# Patient Record
Sex: Male | Born: 1940
Health system: Southern US, Community
[De-identification: ages and names within clinical notes are randomized; demographics above are authoritative.]

## PROBLEM LIST (undated history)

## (undated) DIAGNOSIS — I2510 Atherosclerotic heart disease of native coronary artery without angina pectoris: Secondary | ICD-10-CM

## (undated) DIAGNOSIS — I739 Peripheral vascular disease, unspecified: Secondary | ICD-10-CM

## (undated) DIAGNOSIS — I255 Ischemic cardiomyopathy: Secondary | ICD-10-CM

## (undated) DIAGNOSIS — I509 Heart failure, unspecified: Secondary | ICD-10-CM

## (undated) DIAGNOSIS — E785 Hyperlipidemia, unspecified: Secondary | ICD-10-CM

## (undated) DIAGNOSIS — I6529 Occlusion and stenosis of unspecified carotid artery: Secondary | ICD-10-CM

## (undated) DIAGNOSIS — I10 Essential (primary) hypertension: Secondary | ICD-10-CM

## (undated) HISTORY — DX: Atherosclerotic heart disease of native coronary artery without angina pectoris: I25.10

## (undated) HISTORY — PX: LIPOMA EXCISION: SHX5283

## (undated) HISTORY — DX: Essential (primary) hypertension: I10

## (undated) HISTORY — DX: Hyperlipidemia, unspecified: E78.5

## (undated) HISTORY — DX: Heart failure, unspecified: I50.9

## (undated) HISTORY — DX: Ischemic cardiomyopathy: I25.5

## (undated) HISTORY — PX: CORONARY ANGIOPLASTY: SHX604

## (undated) HISTORY — DX: Peripheral vascular disease, unspecified: I73.9

## (undated) HISTORY — DX: Occlusion and stenosis of unspecified carotid artery: I65.29

---
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
## 2014-03-12 DIAGNOSIS — I2510 Atherosclerotic heart disease of native coronary artery without angina pectoris: Secondary | ICD-10-CM

## 2015-02-19 NOTE — H&P (Signed)
OFFICE VISIT NOTES COPIED TO EPIC FOR DOCUMENTATION  Shawn Schmidt 03/03/15 8:44 AM Location: [facility] Patient #: [phone] DOB: 11-18-1940 Married / Language: English / Race: Black or African American Male   History of Present Illness Shawn Page MD; 03-Mar-2015 6:01 PM) The patient is a 74 year old male who presents for a follow-up for CAD. He had initially presented to establish care for continued cardiac follow-up and management. He currently has a home here and at [facility]. He had ST elevation MI at [facility] on November 28th, 2015 with symptoms of fatigue and heaviness in his chest. He called EMS and was told he was having a heart attack and was taken straight to the cath lab. He underwent PCI to the mid and proximal LAD by placement of 2 overlapping mid LAD stents.   Onlast OV, he was noted to be hypertensive and losartan was changed to valsartan/HCT. At his last vist 1 month ago, echo was ordered to reevaluate heart function. This revealed persistently decreased EF at 40% and global hypokinesis. He was then scheduled for nuclear stress test and presents today for follow up and reevaluation.  Due to cost, his PCP changed his Effient to Plavix. He remains on all other medications and is tolerating well without side effects or bleeding diasthesis.  He denies any recurrence of chest pain, he denies SOB, PND, orthopnea, dizziness, syncope, or symptoms suggestive of TIA. He had initially denied claudication, however, during exercise portion of stress test, he reported significant weakness and discomfort in bilateral calves and now admits to cramping but does not do much or walk uphill. He denies a history of hyperlipidemia, HTN, or diabetes. He was previously smoking 2 ppd of cigarettes and has now been abstinant from tobacco for 2 months.   Problem List/Past Medical Shawn Schmidt; 03-03-2015 10:32 AM) Atherosclerosis of native coronary artery of  native heart without angina pectoris (I25.10) Shawn Schmidt Acute anterior MI: Stenting with 3.5 x 12 mm proximal and 3.5 x 32 mm promos Premier DES in the mid LAD. 50-60% mid circumflex stenosis, nondominant right with a mid 85% stenosis. Essential hypertension, benign (I10) Labs 06/27/2014: Creatinine 1.20, eGFR 70, BMP normal Labs 05/09/2014: Serum glucose 104, creatinine 1.46, eGFR 55, potassium 4.6, CMP otherwise normal Labs 03/13/2014: CBC normal, potassium 3.4, creatinine 1.20, eGFR 70, CMP otherwise normal, Tobacco use disorder, continuous (F17.209) Hyperlipidemia, group A (E78.00) Labs 05/09/2014: Total cholesterol 130, triglycerides 74, HDL 50, LDL 65, LDL particle # [phone], LP-IR score 31 Labs 03/13/2014: total cholesterol 177, triglycerides 100, HDL 61, LDL 113 History of MI (myocardial infarction) (I25.2)03/12/2014 03/12/2014: At [facility], [place] S/P angioplasty with stent (Z95.9)03/12/2014 [facility] Acute anterior MI 03/12/2014: Stenting with 3.5 x 12 mm proximal and 3.5 x 32 mm promos Premier DES in the mid LAD .  Allergies (Shawn Schmidt; 03/03/2015 10:32 AM) Lisinopril *ANTIHYPERTENSIVES*04/04/2014 Cough. Simvastatin *ANTIHYPERLIPIDEMICS*04/04/2014 Leg cramps  Family History Shawn Schmidt; 03-Mar-2015 10:32 AM) Mother Deceased. at age 27 from natural causes; had hx of htn Father Deceased. at age 73 from drowning. hx of htn Siblings 4 brothers(all deceased), 2 sisters , one sister living. Medical history unknown of siblings  Social History Shawn Schmidt; 2015-03-03 10:32 AM) Current tobacco use Former smoker. Quit December 06, 2014 Marital status Married. Living Situation Lives with spouse. he is the caregiver Number of Children 2. Non Drinker/No Alcohol Use  Past Surgical History Shawn Schmidt; 03-03-15 10:32 AM) None12/21/2015  Medication History Shawn Schmidt; 03-03-15 10:38 AM) Carvedilol (  3.125MG Tablet, 1 Tablet Oral two times  daily, Taken starting 12/27/2014) Active. Valsartan-Hydrochlorothiazide (320-25MG Tablet, 1 (one) Tablet Tablet Tablet Oral daily, Taken starting 04/25/2014) Active. Atorvastatin Calcium (40MG Tablet, 1 (one) Tablet Tablet Tablet Oral daily, Taken starting 04/04/2014) Active. Nitrostat (0.4MG Tab Sublingual, 1 Sublingual as needed) Active. Aspirin (81MG Tablet, 1 (one) Oral daily) Active. Zetia (10MG Tablet, 1 Oral at bedtime) Active. Clopidogrel Bisulfate (75MG Tablet, 1 Oral daily in the morning) Active. Medications Reconciled (verbally with patient/ list present)  Diagnostic Studies History Shawn Schmidt; 02/01/2015 8:46 AM) Nuclear stress test10/10/2014 1. The resting electrocardiogram demonstrated normal sinus rhythm, normal resting conduction and no resting arrhythmias. Anterior infarct, old. Stress EKG shows PVC, ventricular couplets. No ST-T changes of ischemia. The patient performed treadmill exercise using a Bruce protocol, completing 3:48 minutes. The patient completed an estimated workload of 5.59 METS, 86% of the maximum predicted heart rate. The stress test was terminated because of fatigue and leg claudication. 2. Perfusion imaging study demonstrates the left ventricle to be slightly dilated and stress images compared to rest images. There is a moderate sized mid to distal anterior anteroapical and anterolateral decreased uptake suggestive of which are of scar moderate to severe ischemia especially in the anterolateral wall. Apex appears to be scarred. There is also a small sized inferior wall scar without ischemia extending from the mid ventricle towards the apex. Left ventricular systolic function calculated by QGS was 41% with anterolateral hypokinesis and apical and apical inferior akinesis. This represents a high risk study. Consider further cardiac workup. Echocardiogram09/21/2016 Left ventricle cavity is normal in size. Moderate concentric hypertrophy of the left  ventricle. Doppler evidence of grade I (impaired) diastolic dysfunction. Left ventricle regional wall motion findings: Mid anteroseptal, Mid anterior, Apical anterior, Apical lateral, Apical inferior, Apical septal and Apical cap akinesis. Moderate LV systolic dysfunction. Visual EF is 40%. Mild mitral regurgitation. Mild tricuspid regurgitation. No evidence of pulmonary hypertension.  Other Problems Shawn Schmidt; 02/01/2015 10:32 AM) Unspecified Diagnosis    Review of Systems (Bridgette Allison AGNP-C; 02/01/2015 11:28 AM) General Present- Feeling well. Not Present- Anorexia, Fatigue and Fever. Respiratory Not Present- Cough, Decreased Exercise Tolerance and Dyspnea. Cardiovascular Present- Claudications (only during stress test). Not Present- Chest Pain, Edema, Orthopnea, Paroxysmal Nocturnal Dyspnea and Shortness of Breath. Gastrointestinal Not Present- Change in Bowel Habits, Constipation and Nausea. Neurological Not Present- Focal Neurological Symptoms. Endocrine Not Present- Appetite Changes, Cold Intolerance and Heat Intolerance. Hematology Not Present- Anemia, Petechiae and Prolonged Bleeding.  Vitals Shawn Schmidt; 02/01/2015 10:36 AM) 02/01/2015 10:32 AM Weight: 171.38 lb Height: 69in Body Surface Area: 1.93 m Body Mass Index: 25.31 kg/m  Pulse: 52 (Regular)  P.OX: 97% (Room air) BP: 132/74 (Sitting, Left Arm, Standard)       Physical Exam (Bridgette Allison AGNP-C; 02/01/2015 11:27 AM) General Mental Status-Alert. General Appearance-Cooperative, Appears stated age, Not in acute distress. Orientation-Oriented X3. Build & Nutrition-Well built and Well nourished.  Head and Neck Thyroid Gland Characteristics - no palpable nodules, no palpable enlargement.  Chest and Lung Exam Palpation Tender - No chest wall tenderness. Auscultation Adventitious sounds - Expiratory wheeze - Left Lung Field.  Cardiovascular Inspection Jugular vein -  Right - No Distention. Auscultation Heart Sounds - S1 WNL, S2 WNL and No gallop present. Murmurs & Other Heart Sounds - Murmur - No murmur.  Abdomen Palpation/Percussion Normal exam - Non Tender and No hepatosplenomegaly. Auscultation Normal exam - Bowel sounds normal.  Peripheral Vascular Lower Extremity Inspection - Left - No Pigmentation, No Varicose  veins. Right - No Pigmentation, No Varicose veins. Palpation - Edema - Left - No edema. Right - No edema. Right - 2+, Bruit and Normal. Popliteal pulse - Bilateral - Feeble. Dorsalis pedis pulse - Left - 0+. Right - 1+. Posterior tibial pulse - Bilateral - 0+. Carotid arteries - Left-No Carotid bruit. Carotid arteries - Right-No Carotid bruit. Abdomen-No prominent abdominal aortic pulsation, No epigastric bruit.  Neurologic Motor-Grossly intact without any focal deficits.  Musculoskeletal Global Assessment Left Lower Extremity - normal range of motion without pain. Right Lower Extremity - normal range of motion without pain.    Assessment & Plan Shawn Page MD; 02/01/2015 5:57 PM) Atherosclerosis of native coronary artery of native heart without angina pectoris (I25.10) Story: [place] Acute anterior MI: Stenting with 3.5 x 12 mm proximal and 3.5 x 32 mm promos Premier DES in the mid LAD. 50-60% mid circumflex stenosis, nondominant right with a mid 85% stenosis.  Exercise myoview stress 01/20/2015: 1. The resting electrocardiogram demonstrated normal sinus rhythm, normal resting conduction and no resting arrhythmias. Anterior infarct, old. Stress EKG shows PVC, ventricular couplets. No ST-T changes of ischemia. The patient performed treadmill exercise using a Bruce protocol, completing 3:48 minutes. The patient completed an estimated workload of 5.59 METS, 86% of the maximum predicted heart rate. The stress test was terminated because of fatigue and leg claudication. 2. Perfusion imaging study demonstrates the  left ventricle to be slightly dilated and stress images compared to rest images. There is a moderate sized mid to distal anterior anteroapical and anterolateral decreased uptake suggestive of which are of scar moderate to severe ischemia especially in the anterolateral wall. Apex appears to be scarred. There is also a small sized inferior wall scar without ischemia extending from the mid ventricle towards the apex. Left ventricular systolic function calculated by QGS was 41% with anterolateral hypokinesis and apical and apical inferior akinesis. This represents a high risk study. Consider further cardiac workup. Impression: EKG 04/04/2014: Sinus bradycardia at the rate of 45 bpm, inferior infarct, anterior infarct old. Cannot exclude inferior and anterolateral ischemia. Abnormal EKG. No significant change from EKG on 04/04/2014. Future Plans 29/08/2839: METABOLIC PANEL, COMPREHENSIVE [phone]) - one time 02/14/2015: CBC & PLATELETS (AUTO) [phone]) - one time 02/14/2015: PT (PROTHROMBIN TIME) (53664) - one time 02/14/2015: TSH (THYROID STIMULATING HORMONE) (40347) - one time History of MI (myocardial infarction) (I25.2) Story: 03/12/2014: At [facility], [place] Essential hypertension, benign (I10) Story: Labs 06/27/2014: Creatinine 1.20, eGFR 70, BMP normal  Labs 05/09/2014: Serum glucose 104, creatinine 1.46, eGFR 55, potassium 4.6, CMP otherwise normal  Labs 03/13/2014: CBC normal, potassium 3.4, creatinine 1.20, eGFR 70, CMP otherwise normal, Tobacco use disorder, continuous - Status is Resolved (F17.209) Claudication, intermittent (I73.9) Future Plans 02/09/2015: LE arterial dopplers (42595) - one time Hyperlipidemia, group A (E78.00) Story: Labs 05/09/2014: Total cholesterol 130, triglycerides 74, HDL 50, LDL 65, LDL particle # 589, LP-IR score 31  Labs 03/13/2014: total cholesterol 177, triglycerides 100, HDL 61, LDL 113 Future Plans 02/14/2015: LIPOPROTEIN, BLD, BY NMR (63875) - one  time Gastroesophageal reflux disease, esophagitis presence not specified (K21.9) Current Plans Started RaNITidine HCl 150MG, 1 (one) Tablet daily as needed, #30, 30 days starting 02/01/2015, Ref. x3. Mechanism of underlying disease process and action of medications discussed with the patient. I discussed primary/secondary prevention and also dietary counseling was done. Patient presenting with known coronary disease, although has not had any recurrence of chest pain, he was barely able to walk for 3 minutes  with severe cramping in his legs suggestive of claudication. He has abnormal physical exam with markedly reduced pulses in the lower extremities, I suspect bilateral SFA disease.  Due to significant amount of myocardium at jeopardy, borderline LVEF, I think proceeding with coronary angiography due to high risk stress test would be appropriate. I discussed with him regarding medical therapy versus coronary angiography, patient would like to proceed with angiography. I'll also set him up for lower extremity arterial duplex for evaluation of PAD. Office visit after the test. Schedule for cardiac catheterization, and possible angioplasty. We discussed regarding risks, benefits, alternatives to this including stress testing, CTA and continued medical therapy. Patient wants to proceed. Understands <1-2% risk of death, stroke, MI, urgent CABG, bleeding, infection, renal failure but not limited to these.  Addendum Note(Bridgette Allison AGNP-C; 02/17/2015 9:00 AM) 02/15/2015: Total cholesterol 129, triglycerides 57, HDL 54, LDL 64, LDL particle # 636, serum glucose 121, creatinine 1.37, eGFR 59, potassium 4.0, CBC normal, TSH 1.67, PT 10.8, INR 1.0     Signed by Shawn Page, MD (02/01/2015 6:01 PM)

## 2015-02-21 ENCOUNTER — Encounter (HOSPITAL_COMMUNITY)
Admission: RE | Disposition: A | Discharge status: Home / Self Care | Payer: Self-pay | Source: Ambulatory Visit | Attending: Cardiology

## 2015-02-21 ENCOUNTER — Ambulatory Visit (HOSPITAL_COMMUNITY)
Admission: RE | Admit: 2015-02-21 | Discharge: 2015-02-21 | Disposition: A | Discharge status: Home / Self Care | Payer: Medicare HMO | Source: Ambulatory Visit | Attending: Cardiology | Admitting: Cardiology

## 2015-02-21 ENCOUNTER — Encounter (HOSPITAL_COMMUNITY): Payer: Self-pay | Admitting: Cardiology

## 2015-02-21 DIAGNOSIS — I10 Essential (primary) hypertension: Secondary | ICD-10-CM | POA: Diagnosis not present

## 2015-02-21 DIAGNOSIS — I2510 Atherosclerotic heart disease of native coronary artery without angina pectoris: Secondary | ICD-10-CM

## 2015-02-21 DIAGNOSIS — I255 Ischemic cardiomyopathy: Secondary | ICD-10-CM | POA: Diagnosis not present

## 2015-02-21 DIAGNOSIS — K219 Gastro-esophageal reflux disease without esophagitis: Secondary | ICD-10-CM | POA: Diagnosis not present

## 2015-02-21 DIAGNOSIS — Z7982 Long term (current) use of aspirin: Secondary | ICD-10-CM | POA: Diagnosis not present

## 2015-02-21 DIAGNOSIS — Z955 Presence of coronary angioplasty implant and graft: Secondary | ICD-10-CM | POA: Diagnosis not present

## 2015-02-21 DIAGNOSIS — I252 Old myocardial infarction: Secondary | ICD-10-CM | POA: Insufficient documentation

## 2015-02-21 DIAGNOSIS — I739 Peripheral vascular disease, unspecified: Secondary | ICD-10-CM | POA: Diagnosis not present

## 2015-02-21 DIAGNOSIS — E7800 Pure hypercholesterolemia, unspecified: Secondary | ICD-10-CM | POA: Diagnosis not present

## 2015-02-21 DIAGNOSIS — Z87891 Personal history of nicotine dependence: Secondary | ICD-10-CM | POA: Insufficient documentation

## 2015-02-21 HISTORY — PX: CARDIAC CATHETERIZATION: SHX172

## 2015-02-21 SURGERY — LEFT HEART CATH AND CORONARY ANGIOGRAPHY
Anesthesia: LOCAL

## 2015-02-21 MED ORDER — MIDAZOLAM HCL 2 MG/2ML IJ SOLN
INTRAMUSCULAR | Status: DC | PRN
Start: 2015-02-21 — End: 2015-02-21
  Administered 2015-02-21: 1 mg via INTRAVENOUS

## 2015-02-21 MED ORDER — SODIUM CHLORIDE 0.9 % IJ SOLN: 3.0000 mL | Freq: Two times a day (BID) | INTRAMUSCULAR | Status: DC

## 2015-02-21 MED ORDER — VERAPAMIL HCL 2.5 MG/ML IV SOLN
INTRA_ARTERIAL | Status: DC | PRN
  Administered 2015-02-21: 8 mL via INTRA_ARTERIAL

## 2015-02-21 MED ORDER — VERAPAMIL HCL 2.5 MG/ML IV SOLN
INTRAVENOUS | Status: AC
  Filled 2015-02-21: qty 2

## 2015-02-21 MED ORDER — FENTANYL CITRATE (PF) 100 MCG/2ML IJ SOLN
INTRAMUSCULAR | Status: AC
  Filled 2015-02-21: qty 4

## 2015-02-21 MED ORDER — SODIUM CHLORIDE 0.9 % IV SOLN: INTRAVENOUS | Status: DC

## 2015-02-21 MED ORDER — ASPIRIN 81 MG PO CHEW: 81.0000 mg | CHEWABLE_TABLET | ORAL | Status: DC

## 2015-02-21 MED ORDER — NITROGLYCERIN 1 MG/10 ML FOR IR/CATH LAB
INTRA_ARTERIAL | Status: DC | PRN
  Administered 2015-02-21: 5 mL

## 2015-02-21 MED ORDER — FENTANYL CITRATE (PF) 100 MCG/2ML IJ SOLN
INTRAMUSCULAR | Status: DC | PRN
  Administered 2015-02-21: 25 ug via INTRAVENOUS

## 2015-02-21 MED ORDER — SODIUM CHLORIDE 0.9 % WEIGHT BASED INFUSION
3.0000 mL/kg/h | INTRAVENOUS | Status: DC
  Administered 2015-02-21: 3 mL/kg/h via INTRAVENOUS

## 2015-02-21 MED ORDER — MIDAZOLAM HCL 2 MG/2ML IJ SOLN
INTRAMUSCULAR | Status: AC
  Filled 2015-02-21: qty 4

## 2015-02-21 MED ORDER — SODIUM CHLORIDE 0.9 % WEIGHT BASED INFUSION
1.0000 mL/kg/h | INTRAVENOUS | Status: DC
Start: 2015-02-22 — End: 2015-02-21

## 2015-02-21 MED ORDER — LIDOCAINE HCL (PF) 1 % IJ SOLN
INTRAMUSCULAR | Status: AC
  Filled 2015-02-21: qty 30

## 2015-02-21 MED ORDER — SODIUM CHLORIDE 0.9 % IJ SOLN: 3.0000 mL | INTRAMUSCULAR | Status: DC | PRN

## 2015-02-21 MED ORDER — HEPARIN SODIUM (PORCINE) 1000 UNIT/ML IJ SOLN
INTRAMUSCULAR | Status: AC
  Filled 2015-02-21: qty 1

## 2015-02-21 MED ORDER — SODIUM CHLORIDE 0.9 % IV SOLN: 250.0000 mL | INTRAVENOUS | Status: DC | PRN

## 2015-02-21 MED ORDER — NITROGLYCERIN IN D5W 200-5 MCG/ML-% IV SOLN
INTRAVENOUS | Status: AC
  Filled 2015-02-21: qty 250

## 2015-02-21 MED ORDER — HEPARIN SODIUM (PORCINE) 1000 UNIT/ML IJ SOLN
INTRAMUSCULAR | Status: DC | PRN
  Administered 2015-02-21: 5000 [IU] via INTRAVENOUS

## 2015-02-21 MED ORDER — IOHEXOL 350 MG/ML SOLN
INTRAVENOUS | Status: DC | PRN
  Administered 2015-02-21: 80 mL via INTRA_ARTERIAL

## 2015-02-21 SURGICAL SUPPLY — 12 items
CATH INFINITI 5F PIG 125CM (CATHETERS) ×2 IMPLANT
CATH INFINITI 5FR ANG PIGTAIL (CATHETERS) ×2 IMPLANT
CATH OPTITORQUE TIG 4.0 5F (CATHETERS) ×2 IMPLANT
DEVICE RAD COMP TR BAND LRG (VASCULAR PRODUCTS) ×2 IMPLANT
GLIDESHEATH SLEND A-KIT 6F 20G (SHEATH) ×2 IMPLANT
GUIDEWIRE ANGLED .035X150CM (WIRE) ×2 IMPLANT
KIT HEART LEFT (KITS) ×2 IMPLANT
PACK CARDIAC CATHETERIZATION (CUSTOM PROCEDURE TRAY) ×2 IMPLANT
SYR MEDRAD MARK V 150ML (SYRINGE) ×2 IMPLANT
TRANSDUCER W/STOPCOCK (MISCELLANEOUS) ×2 IMPLANT
TUBING CIL FLEX 10 FLL-RA (TUBING) ×2 IMPLANT
WIRE SAFE-T 1.5MM-J .035X260CM (WIRE) ×2 IMPLANT

## 2015-02-21 NOTE — Discharge Instructions (Signed)
Radial Site Care °Refer to this sheet in the next few weeks. These instructions provide you with information about caring for yourself after your procedure. Your health care provider may also give you more specific instructions. Your treatment has been planned according to current medical practices, but problems sometimes occur. Call your health care provider if you have any problems or questions after your procedure. °WHAT TO EXPECT AFTER THE PROCEDURE °After your procedure, it is typical to have the following: °· Bruising at the radial site that usually fades within 1-2 weeks. °· Blood collecting in the tissue (hematoma) that may be painful to the touch. It should usually decrease in size and tenderness within 1-2 weeks. °HOME CARE INSTRUCTIONS °· Take medicines only as directed by your health care provider. °· You may shower 24-48 hours after the procedure or as directed by your health care provider. Remove the bandage (dressing) and gently wash the site with plain soap and water. Pat the area dry with a clean towel. Do not rub the site, because this may cause bleeding. °· Do not take baths, swim, or use a hot tub until your health care provider approves. °· Check your insertion site every day for redness, swelling, or drainage. °· Do not apply powder or lotion to the site. °· Do not flex or bend the affected arm for 24 hours or as directed by your health care provider. °· Do not push or pull heavy objects with the affected arm for 24 hours or as directed by your health care provider. °· Do not lift over 10 lb (4.5 kg) for 5 days after your procedure or as directed by your health care provider. °· Ask your health care provider when it is okay to: °¨ Return to work or school. °¨ Resume usual physical activities or sports. °¨ Resume sexual activity. °· Do not drive home if you are discharged the same day as the procedure. Have someone else drive you. °· You may drive 24 hours after the procedure unless otherwise  instructed by your health care provider. °· Do not operate machinery or power tools for 24 hours after the procedure. °· If your procedure was done as an outpatient procedure, which means that you went home the same day as your procedure, a responsible adult should be with you for the first 24 hours after you arrive home. °· Keep all follow-up visits as directed by your health care provider. This is important. °SEEK MEDICAL CARE IF: °· You have a fever. °· You have chills. °· You have increased bleeding from the radial site. Hold pressure on the site. °SEEK IMMEDIATE MEDICAL CARE IF: °· You have unusual pain at the radial site. °· You have redness, warmth, or swelling at the radial site. °· You have drainage (other than a small amount of blood on the dressing) from the radial site. °· The radial site is bleeding, and the bleeding does not stop after 30 minutes of holding steady pressure on the site. °· Your arm or hand becomes pale, cool, tingly, or numb. °  °This information is not intended to replace advice given to you by your health care provider. Make sure you discuss any questions you have with your health care provider. °  °Document Released: 05/04/2010 Document Revised: 04/22/2014 Document Reviewed: 10/18/2013 °Elsevier Interactive Patient Education ©2016 [employer] ° °

## 2015-02-21 NOTE — Interval H&P Note (Signed)
History and Physical Interval Note:  02/21/2015 8:31 AM  Shawn Schmidt  has presented today for surgery, with the diagnosis of abnormal stress test  The various methods of treatment have been discussed with the patient and family. After consideration of risks, benefits and other options for treatment, the patient has consented to  Procedure(s): Left Heart Cath and Coronary Angiography (N/A) and possible PCI as a surgical intervention .  The patient's history has been reviewed, patient examined, no change in status, stable for surgery.  I have reviewed the patient's chart and labs.  Questions were answered to the patient's satisfaction.    Ischemic Symptoms? CCS III (Marked limitation of ordinary activity) Anti-ischemic Medical Therapy? Minimal Therapy (1 class of medications) Non-invasive Test Results? High-risk stress test findings: cardiac mortality >3%/yr Prior CABG? No Previous CABG   Patient Information:   1-2V CAD, no prox LAD  A (8)  Indication: 18; Score: 8   Patient Information:   CTO of 1 vessel, no other CAD  A (7)  Indication: 28; Score: 7   Patient Information:   1V CAD with prox LAD  A (9)  Indication: 34; Score: 9   Patient Information:   2V-CAD with prox LAD  A (9)  Indication: 40; Score: 9   Patient Information:   3V-CAD without LMCA  A (9)  Indication: 46; Score: 9   Patient Information:   3V-CAD without LMCA With Abnormal LV systolic function  A (9)  Indication: 48; Score: 9   Patient Information:   LMCA-CAD  A (9)  Indication: 49; Score: 9   Patient Information:   2V-CAD with prox LAD PCI  A (7)  Indication: 62; Score: 7   Patient Information:   2V-CAD with prox LAD CABG  A (8)  Indication: 62; Score: 8   Patient Information:   3V-CAD without LMCA With Low CAD burden(i.e., 3 focal stenoses, low SYNTAX score) PCI  A (7)  Indication: 63; Score: 7   Patient Information:   3V-CAD without LMCA With Low CAD  burden(i.e., 3 focal stenoses, low SYNTAX score) CABG  A (9)  Indication: 63; Score: 9   Patient Information:   3V-CAD without LMCA E06c - Intermediate-high CAD burden (i.e., multiple diffuse lesions, presence of CTO, or high SYNTAX score) PCI  U (4)  Indication: 64; Score: 4   Patient Information:   3V-CAD without LMCA E06c - Intermediate-high CAD burden (i.e., multiple diffuse lesions, presence of CTO, or high SYNTAX score) CABG  A (9)  Indication: 64; Score: 9   Patient Information:   LMCA-CAD With Isolated LMCA stenosis  PCI  U (6)  Indication: 65; Score: 6   Patient Information:   LMCA-CAD With Isolated LMCA stenosis  CABG  A (9)  Indication: 65; Score: 9   Patient Information:   LMCA-CAD Additional CAD, low CAD burden (i.e., 1- to 2-vessel additional involvement, low SYNTAX score) PCI  U (5)  Indication: 66; Score: 5   Patient Information:   LMCA-CAD Additional CAD, low CAD burden (i.e., 1- to 2-vessel additional involvement, low SYNTAX score) CABG  A (9)  Indication: 66; Score: 9   Patient Information:   LMCA-CAD Additional CAD, intermediate-high CAD burden (i.e., 3-vessel involvement, presence of CTO, or high SYNTAX score) PCI  I (3)  Indication: 67; Score: 3   Patient Information:   LMCA-CAD Additional CAD, intermediate-high CAD burden (i.e., 3-vessel involvement, presence of CTO, or high SYNTAX score) CABG  A (9)  Indication: 67; Score: 9  [name]

## 2015-04-19 DIAGNOSIS — I739 Peripheral vascular disease, unspecified: Secondary | ICD-10-CM | POA: Diagnosis not present

## 2015-04-23 DIAGNOSIS — I739 Peripheral vascular disease, unspecified: Secondary | ICD-10-CM

## 2015-04-23 NOTE — H&P (Signed)
OFFICE VISIT NOTES COPIED TO EPIC FOR DOCUMENTATION Shawn Schmidt 03/01/2015 8:28 AM Location: [facility] Patient #: [id number] DOB: November 19, 1940 Married / Language: English / Race: Black or African American Male   History of Present Illness Shawn Schmidt AGNP-C; 03/01/2015 12:47 PM) The patient is a 75 year old male who presents for a Follow-up for CAD. He had initially presented to establish care for continued cardiac follow-up and management. He currently has a home here and at [facility]. He had ST elevation MI at [facility] on November 28th, 2015 with symptoms of fatigue and heaviness in his chest. He called EMS and was told he was having a heart attack and was taken straight to the cath lab. He underwent PCI to the mid and proximal LAD by placement of 2 overlapping mid LAD stents.   At his last vist 1 month ago, echo was ordered to reevaluate heart function. This revealed persistently decreased EF at 40% and global hypokinesis. He was then scheduled for nuclear stress test which was markedly abnormal, therefore was scheduled for coronary angiogram to evaluate for progression of CAD. He presents today for follow up.  He denies any recurrence of chest pain, he denies SOB, PND, orthopnea, dizziness, syncope, or symptoms suggestive of TIA. He had initially denied claudication, however, during exercise portion of stress test, he reported significant weakness and discomfort in bilateral calves and now admits to cramping and fatigue but does not do much fast walking or walk uphill. He denies a history of hyperlipidemia, HTN, or diabetes. He was previously smoking 2 ppd of cigarettes and has now been abstinant from tobacco for 3 months.   Problem List/Past Medical (Shawn Schmidt; 03/01/2015 9:37 AM) Atherosclerosis of native coronary artery of native heart without angina pectoris (I25.10) [place] Acute anterior MI: Stenting with 3.5 x 12 mm proximal and 3.5 x 32  mm promos Premier DES in the mid LAD. 50-60% mid circumflex stenosis, nondominant right with a mid 85% stenosis. Exercise myoview stress 01/20/2015: 1. The resting electrocardiogram demonstrated normal sinus rhythm, normal resting conduction and no resting arrhythmias. Anterior infarct, old. Stress EKG shows PVC, ventricular couplets. No ST-T changes of ischemia. The patient performed treadmill exercise using a Bruce protocol, completing 3:48 minutes. The patient completed an estimated workload of 5.59 METS, 86% of the maximum predicted heart rate. The stress test was terminated because of fatigue and leg claudication. 2. Perfusion imaging study demonstrates the left ventricle to be slightly dilated and stress images compared to rest images. There is a moderate sized mid to distal anterior anteroapical and anterolateral decreased uptake suggestive of which are of scar moderate to severe ischemia especially in the anterolateral wall. Apex appears to be scarred. There is also a small sized inferior wall scar without ischemia extending from the mid ventricle towards the apex. Left ventricular systolic function calculated by QGS was 41% with anterolateral hypokinesis and apical and apical inferior akinesis. This represents a high risk study. Consider further cardiac workup. Essential hypertension, benign (I10) Hyperlipidemia, group A (E78.00) Claudication, intermittent (I73.9) Gastroesophageal reflux disease, esophagitis presence not specified (K21.9) History of MI (myocardial infarction) (I25.2)03/12/2014 03/12/2014: At [facility], [place] S/P angioplasty with stent (Z95.9)03/12/2014 [facility] Acute anterior MI 03/12/2014: Stenting with 3.5 x 12 mm proximal and 3.5 x 32 mm promos Premier DES in the mid LAD . Tobacco use disorder, continuous (F17.209)  Allergies (Shawn Schmidt; 03/01/2015 9:35 AM) Lisinopril *ANTIHYPERTENSIVES*04/04/2014 Cough. Simvastatin *ANTIHYPERLIPIDEMICS*04/04/2014 Leg  cramps  Family History (Shawn Schmidt; 03/01/2015  9:35 AM) Mother Deceased. at age 45 from natural causes; had hx of htn Father Deceased. at age 49 from drowning. hx of htn Siblings 4 brothers(all deceased), 2 sisters , one sister living. Medical history unknown of siblings  Social History (Shawn Schmidt; 03/01/2015 9:35 AM) Current tobacco use Former smoker. Quit December 06, 2014 Marital status Married. Living Situation Lives with spouse. he is the caregiver Number of Children 2. Non Drinker/No Alcohol Use  Past Surgical History (Shawn Schmidt; 03/01/2015 9:35 AM) None12/21/2015  Medication History (Shawn Schmidt; 03/01/2015 9:41 AM) Atorvastatin Calcium ('40MG'$  Tablet, 1 (one) Tablet Oral daily, Taken starting 02/09/2015) Active. RaNITidine HCl ('150MG'$  Tablet, 1 (one) Tablet Oral daily as needed, Taken starting 02/01/2015) Active. Carvedilol (3.'125MG'$  Tablet, 1 Tablet Tablet Oral two times daily, Taken starting 12/27/2014) Active. Valsartan-Hydrochlorothiazide (320-'25MG'$  Tablet, 1 (one) Tablet Tablet Tablet T Oral daily, Taken starting 04/25/2014) Discontinued: Patient Choice. Nitrostat (0.'4MG'$  Tab Sublingual, 1 Sublingual as needed) Active. Aspirin ('81MG'$  Tablet, 1 (one) Oral daily) Active. Zetia ('10MG'$  Tablet, 1 Oral at bedtime) Active. Clopidogrel Bisulfate ('75MG'$  Tablet, 1 Oral daily in the morning) Active. Medications Reconciled (verbally with patient/ list present)  Diagnostic Studies History (Shawn Schmidt, AGNP-C; 03/01/2015 9:48 AM) Shawn Schmidt 02/15/2015: Total cholesterol 129, triglycerides 57, HDL 54, LDL 64, LDL particle # 636, serum glucose 121, creatinine 1.37, eGFR 59, potassium 4.0, CBC normal, TSH 1.67, PT 10.8, INR 1.0 06/27/2014: Creatinine 1.20, eGFR 70, BMP normal 05/09/2014: Total cholesterol 130, triglycerides 74, HDL 50, LDL 65, LDL particle # 589, LP-IR score 31, Serum glucose 104, creatinine 1.46, eGFR 55, potassium 4.6, CMP otherwise normal  03/13/2014: total cholesterol 177, triglycerides 100, HDL 61, LDL 113, CBC normal, potassium 3.4, creatinine 1.20, eGFR 70, CMP otherwise normal Lower Extremity Dopplers10/27/2016 Significant velocity increase at the right mid superficial femoral artery suggesting >50% stenosis. Diffuse disease in left leg. This exam reveals moderately decreased perfusion of the lower extremity bilaterally, RABI 0.73 and Left at 0.53. Coronary Angiogram11/11/2014 1. Widely patent mid LAD stent, 3.5 x 12 and 3.5 x 32 mm Promus placed on 03/12/2014 for acute MI, type III LAD which supplies essentially the entire anterolateral wall and inferior wall. 2. Moderate stenosis in the mid circumflex, 50%. Nondominant RCA. 3. Ischemic cardiomyopathy, LVEF 30-35% with mid to distal anterior anteroapical, apical, apical inferior, mid inferior severe hypokinesis to akinesis. 4. Limited abdominal aortogram with bifemoral runoff reveals widely patent iliac vessels, profunda femoral vessel, moderately diseased bilateral internal iliac, severely diseased bilateral profunda femoral arteries. Nuclear stress test10/10/2014 1. The resting electrocardiogram demonstrated normal sinus rhythm, normal resting conduction and no resting arrhythmias. Anterior infarct, old. Stress EKG shows PVC, ventricular couplets. No ST-T changes of ischemia. The patient performed treadmill exercise using a Bruce protocol, completing 3:48 minutes. The patient completed an estimated workload of 5.59 METS, 86% of the maximum predicted heart rate. The stress test was terminated because of fatigue and leg claudication. 2. Perfusion imaging study demonstrates the left ventricle to be slightly dilated and stress images compared to rest images. There is a moderate sized mid to distal anterior anteroapical and anterolateral decreased uptake suggestive of which are of scar moderate to severe ischemia especially in the anterolateral wall. Apex appears to be scarred. There is also  a small sized inferior wall scar without ischemia extending from the mid ventricle towards the apex. Left ventricular systolic function calculated by QGS was 41% with anterolateral hypokinesis and apical and apical inferior akinesis. This represents a high risk study. Consider further cardiac workup. Echocardiogram09/21/2016 Left  ventricle cavity is normal in size. Moderate concentric hypertrophy of the left ventricle. Doppler evidence of grade I (impaired) diastolic dysfunction. Left ventricle regional wall motion findings: Mid anteroseptal, Mid anterior, Apical anterior, Apical lateral, Apical inferior, Apical septal and Apical cap akinesis. Moderate LV systolic dysfunction. Visual EF is 40%. Mild mitral regurgitation. Mild tricuspid regurgitation. No evidence of pulmonary hypertension.  Other Problems (Shawn Schmidt; 03/01/2015 9:37 AM) Unspecified Diagnosis    Review of Systems [facility] Ebony Schmidt, [place]; 03/01/2015 12:47 PM) General Present- Feeling well. Not Present- Anorexia, Fatigue and Fever. Respiratory Not Present- Cough, Decreased Exercise Tolerance and Dyspnea. Cardiovascular Present- Claudications (only during stress test). Not Present- Chest Pain, Edema, Orthopnea, Paroxysmal Nocturnal Dyspnea and Shortness of Breath. Gastrointestinal Not Present- Change in Bowel Habits, Constipation and Nausea. Neurological Not Present- Focal Neurological Symptoms. Endocrine Not Present- Appetite Changes, Cold Intolerance and Heat Intolerance. Hematology Not Present- Anemia, Petechiae and Prolonged Bleeding.  Vitals (Shawn Schmidt; 03/01/2015 9:42 AM) 03/01/2015 9:37 AM Weight: 171.38 lb Height: 69in Body Surface Area: 1.93 m Body Mass Index: 25.31 kg/m  Pulse: 52 (Regular)  P.OX: 98% (Room air) BP: 128/62 (Sitting, Left Arm, Standard)  Physical Exam (Shawn Schmidt AGNP-C; 03/01/2015 12:49 PM) General Mental Status-Alert. General Appearance-Cooperative, Appears stated  age, Not in acute distress. Orientation-Oriented X3. Build & Nutrition-Well built and Well nourished.  Head and Neck Thyroid Gland Characteristics - no palpable nodules, no palpable enlargement.  Chest and Lung Exam Palpation Tender - No chest wall tenderness. Auscultation Adventitious sounds - Expiratory wheeze - Left Lung Field.  Cardiovascular Inspection Jugular vein - Right - No Distention. Auscultation Heart Sounds - S1 WNL, S2 WNL and No gallop present. Murmurs & Other Heart Sounds - Murmur - No murmur.  Abdomen Palpation/Percussion Normal exam - Non Tender and No hepatosplenomegaly. Auscultation Normal exam - Bowel sounds normal.  Peripheral Vascular Lower Extremity Inspection - Left - No Pigmentation, No Varicose veins. Right - No Pigmentation, No Varicose veins. Palpation - Edema - Left - No edema. Right - No edema. Femoral pulse - Left - Normal. Right - 2+ and Bruit. Popliteal pulse - Bilateral - Feeble. Dorsalis pedis pulse - Left - 0+. Right - 1+. Posterior tibial pulse - Bilateral - 0+. Carotid arteries - Left-No Carotid bruit. Carotid arteries - Right-No Carotid bruit. Abdomen-No prominent abdominal aortic pulsation, No epigastric bruit. Note: Right radial access site healed well, asymptomatic   Neurologic Motor-Grossly intact without any focal deficits.  Musculoskeletal Global Assessment Left Lower Extremity - normal range of motion without pain. Right Lower Extremity - normal range of motion without pain.    Assessment & Plan  Atherosclerosis of native coronary artery of native heart without angina pectoris (I25.10) Story: [place] Acute anterior MI: Stenting with 3.5 x 12 mm proximal and 3.5 x 32 mm promos Premier DES in the mid LAD. 50-60% mid circumflex stenosis, nondominant right with a mid 85% stenosis.  Coronary Angiogram 02/21/2015 1. Widely patent mid LAD stent, 3.5 x 12 and 3.5 x 32 mm Promus placed on 03/12/2014 for acute  MI, type III LAD which supplies essentially the entire anterolateral wall and inferior wall. 2. Moderate stenosis in the mid circumflex, 50%. Nondominant RCA. 3. Ischemic cardiomyopathy, LVEF 30-35% with mid to distal anterior anteroapical, apical, apical inferior, mid inferior severe hypokinesis to akinesis. 4. Limited abdominal aortogram with bifemoral runoff reveals widely patent iliac vessels, profunda femoral vessel, moderately diseased bilateral internal iliac, severely diseased bilateral profunda femoral artery  Exercise myoview stress 01/20/2015: 1. The resting  electrocardiogram demonstrated normal sinus rhythm, normal resting conduction and no resting arrhythmias. Anterior infarct, old. Stress EKG shows PVC, ventricular couplets. No ST-T changes of ischemia. The patient performed treadmill exercise using a Bruce protocol, completing 3:48 minutes. The patient completed an estimated workload of 5.59 METS, 86% of the maximum predicted heart rate. The stress test was terminated because of fatigue and leg claudication. 2. Perfusion imaging study demonstrates the left ventricle to be slightly dilated and stress images compared to rest images. There is a moderate sized mid to distal anterior anteroapical and anterolateral decreased uptake suggestive of which are of scar moderate to severe ischemia especially in the anterolateral wall. Apex appears to be scarred. There is also a small sized inferior wall scar without ischemia extending from the mid ventricle towards the apex. Left ventricular systolic function calculated by QGS was 41% with anterolateral hypokinesis and apical and apical inferior akinesis. This represents a high risk Impression: EKG 04/04/2014: Sinus bradycardia at the rate of 45 bpm, inferior infarct, anterior infarct old. Cannot exclude inferior and anterolateral ischemia. Abnormal EKG. No significant change from EKG on 04/04/2014. History of MI (myocardial infarction) (I25.2) Story:  03/12/2014: At [facility], [place] Essential hypertension, benign (I10) PAD (peripheral artery disease) (I73.9) Story: Lower extremity arterial duplex 02/09/2015: Significant velocity increase at the right mid superficial femoral artery suggesting >50% stenosis. Diffuse disease in left leg. This exam reveals moderately decreased perfusion of the lower extremity bilaterally, RABI 0.73 and Left at 0.53. Current Plans Pt Education - Peripheral Artery Disease: arteries Future Plans 16/01/9603: METABOLIC PANEL, BASIC (54098) - one time 04/13/2015: CBC & PLATELETS (AUTO) (11914) - one time 04/13/2015: PT (PROTHROMBIN TIME) (78295) - one time Hyperlipidemia, group A (E78.00) Current Plans Mechanism of underlying disease process and action of medications discussed with the patient. I discussed primary/secondary prevention and also dietary counseling was done. He presents for follow-up after coronary angiogram performed due to persistently decreased LVEF and abnormal nuclear stress test. Coronary angiogram revealed widely patent mid LAD stent placed on 03/12/2014 for acute MI, type III LAD which supplies essentially the entire anterolateral wall and inferior wall, and confirmed ischemic cardiomyopathy, LVEF 30-35% with mid to distal anterior anteroapical, apical, apical inferior, mid inferior severe hypokinesis to akinesis. Limited abdominal aortogram with bifemoral runoff revealed widely patent iliac vessels, profunda femoral vessel, moderately diseased bilateral internal iliac, and severely diseased bilateral profunda femoral artery.   He underwent LE duplex which revealed significant velocity increase at the right mid superficial femoral artery suggesting >50% stenosis, diffuse disease in left leg, with RABI of 0.73 and LABI of 0.53. He denied any symptoms of claudication until his stress test, however now on further questioning, admits to significant fatigue and bilateral thighs with exertion. Needs to  schedule for peripheral arteriogram and possible angioplasty given symptoms. Patient understands the risks, benefits, alternatives including medical therapy, CT angiography. Patient understands <1-2% risk of death, embolic complications, bleeding, infection, renal failure, urgent surgical revascularization, but not limited to these and wants to proceed.  He also reports diffuse muscle aches in his arms and legs, typically at night, and is questioning if this could be related to medication. Will change atorvastatin to Crestor to see if this helps with the symptoms. Otherwise, no changes in medications were made. I'll see him back following PV angiogram.  Discontinued Atorvastatin Calcium '40MG'$  (myalgia). Started Crestor '20MG'$ , 1 (one) Tablet daily, #30, 03/01/2015, Ref. x4. Addendum Note(Shawn Allison AGNP-C; 04/19/2015 2:36 PM) 04/18/2014:0 glucose 113, creatinine 1.36, eGFR 60, CBC  normal, PT/INR normal  Labs stable to proceed with PV angio  Signed by Shawn Schmidt, AGNP-C (03/01/2015 12:49 PM)

## 2015-04-25 ENCOUNTER — Encounter (HOSPITAL_COMMUNITY)
Admission: RE | Disposition: A | Discharge status: Home / Self Care | Payer: PPO | Source: Ambulatory Visit | Attending: Cardiology

## 2015-04-25 ENCOUNTER — Encounter (HOSPITAL_COMMUNITY): Payer: Self-pay | Admitting: Cardiology

## 2015-04-25 ENCOUNTER — Ambulatory Visit (HOSPITAL_COMMUNITY)
Admission: RE | Admit: 2015-04-25 | Discharge: 2015-04-25 | Disposition: A | Discharge status: Home / Self Care | Payer: PPO | Source: Ambulatory Visit | Attending: Cardiology | Admitting: Cardiology

## 2015-04-25 DIAGNOSIS — E7800 Pure hypercholesterolemia, unspecified: Secondary | ICD-10-CM | POA: Diagnosis not present

## 2015-04-25 DIAGNOSIS — I70213 Atherosclerosis of native arteries of extremities with intermittent claudication, bilateral legs: Secondary | ICD-10-CM | POA: Diagnosis not present

## 2015-04-25 DIAGNOSIS — I10 Essential (primary) hypertension: Secondary | ICD-10-CM | POA: Diagnosis not present

## 2015-04-25 DIAGNOSIS — I739 Peripheral vascular disease, unspecified: Secondary | ICD-10-CM

## 2015-04-25 DIAGNOSIS — I700 Atherosclerosis of aorta: Secondary | ICD-10-CM | POA: Insufficient documentation

## 2015-04-25 DIAGNOSIS — Z8249 Family history of ischemic heart disease and other diseases of the circulatory system: Secondary | ICD-10-CM | POA: Insufficient documentation

## 2015-04-25 DIAGNOSIS — I2510 Atherosclerotic heart disease of native coronary artery without angina pectoris: Secondary | ICD-10-CM | POA: Diagnosis not present

## 2015-04-25 DIAGNOSIS — I252 Old myocardial infarction: Secondary | ICD-10-CM | POA: Diagnosis not present

## 2015-04-25 DIAGNOSIS — I255 Ischemic cardiomyopathy: Secondary | ICD-10-CM | POA: Insufficient documentation

## 2015-04-25 DIAGNOSIS — Z955 Presence of coronary angioplasty implant and graft: Secondary | ICD-10-CM | POA: Insufficient documentation

## 2015-04-25 DIAGNOSIS — Z87891 Personal history of nicotine dependence: Secondary | ICD-10-CM | POA: Insufficient documentation

## 2015-04-25 DIAGNOSIS — Z7982 Long term (current) use of aspirin: Secondary | ICD-10-CM | POA: Diagnosis not present

## 2015-04-25 DIAGNOSIS — K219 Gastro-esophageal reflux disease without esophagitis: Secondary | ICD-10-CM | POA: Insufficient documentation

## 2015-04-25 HISTORY — PX: PERIPHERAL VASCULAR CATHETERIZATION: SHX172C

## 2015-04-25 SURGERY — LOWER EXTREMITY ANGIOGRAPHY
Laterality: Bilateral

## 2015-04-25 MED ORDER — MIDAZOLAM HCL 2 MG/2ML IJ SOLN
INTRAMUSCULAR | Status: DC | PRN
  Administered 2015-04-25: 1 mg via INTRAVENOUS

## 2015-04-25 MED ORDER — HEPARIN (PORCINE) IN NACL 2-0.9 UNIT/ML-% IJ SOLN
INTRAMUSCULAR | Status: AC
Start: 2015-04-25 — End: 2015-04-25
  Filled 2015-04-25: qty 1000

## 2015-04-25 MED ORDER — MIDAZOLAM HCL 2 MG/2ML IJ SOLN
INTRAMUSCULAR | Status: AC
  Filled 2015-04-25: qty 2

## 2015-04-25 MED ORDER — HEPARIN SODIUM (PORCINE) 1000 UNIT/ML IJ SOLN
INTRAMUSCULAR | Status: AC
Start: 2015-04-25 — End: 2015-04-25
  Filled 2015-04-25: qty 1

## 2015-04-25 MED ORDER — LIDOCAINE HCL (PF) 1 % IJ SOLN
INTRAMUSCULAR | Status: AC
Start: 2015-04-25 — End: 2015-04-25
  Filled 2015-04-25: qty 30

## 2015-04-25 MED ORDER — IODIXANOL 320 MG/ML IV SOLN
INTRAVENOUS | Status: DC | PRN
  Administered 2015-04-25: 90 mL via INTRAVENOUS

## 2015-04-25 MED ORDER — FENTANYL CITRATE (PF) 100 MCG/2ML IJ SOLN
INTRAMUSCULAR | Status: DC | PRN
  Administered 2015-04-25: 25 ug via INTRAVENOUS

## 2015-04-25 MED ORDER — SODIUM CHLORIDE 0.9 % IV SOLN
INTRAVENOUS | Status: DC
  Administered 2015-04-25: 07:00:00 via INTRAVENOUS

## 2015-04-25 MED ORDER — SODIUM CHLORIDE 0.9 % IV SOLN: INTRAVENOUS | Status: DC

## 2015-04-25 MED ORDER — SODIUM CHLORIDE 0.9 % IV BOLUS (SEPSIS)
250.0000 mL | Freq: Once | INTRAVENOUS | Status: AC
  Administered 2015-04-25: 06:00:00 via INTRAVENOUS

## 2015-04-25 MED ORDER — HEPARIN (PORCINE) IN NACL 2-0.9 UNIT/ML-% IJ SOLN
INTRAMUSCULAR | Status: DC | PRN
  Administered 2015-04-25: 17 mL

## 2015-04-25 MED ORDER — FENTANYL CITRATE (PF) 100 MCG/2ML IJ SOLN
INTRAMUSCULAR | Status: AC
  Filled 2015-04-25: qty 2

## 2015-04-25 SURGICAL SUPPLY — 9 items
CATH OMNI FLUSH 5F 65CM (CATHETERS) ×3 IMPLANT
GUIDEWIRE ANGLED .035X150CM (WIRE) ×3 IMPLANT
KIT MICROINTRODUCER STIFF 5F (SHEATH) ×3 IMPLANT
KIT PV (KITS) ×3 IMPLANT
SHEATH PINNACLE 5F 10CM (SHEATH) ×3 IMPLANT
SYR MEDRAD MARK V 150ML (SYRINGE) ×3 IMPLANT
TRANSDUCER W/STOPCOCK (MISCELLANEOUS) ×3 IMPLANT
TRAY PV CATH (CUSTOM PROCEDURE TRAY) ×3 IMPLANT
WIRE HITORQ VERSACORE ST 145CM (WIRE) ×3 IMPLANT

## 2015-04-25 NOTE — Interval H&P Note (Signed)
History and Physical Interval Note:  04/25/2015 7:45 AM  Shawn Schmidt  has presented today for surgery, with the diagnosis of pad  The various methods of treatment have been discussed with the patient and family. After consideration of risks, benefits and other options for treatment, the patient has consented to  Procedure(s): Lower Extremity Angiography (N/A) and possible PTA as a surgical intervention .  The patient's history has been reviewed, patient examined, no change in status, stable for surgery.  I have reviewed the patient's chart and labs.  Questions were answered to the patient's satisfaction.     [name]

## 2015-04-25 NOTE — Progress Notes (Addendum)
Site area: rt groin fa sheath pulled and pressure held by Leola Brazil Site Prior to Removal:  Level  0 Pressure Applied For:  20 minutes Manual:   yes Patient Status During Pull:  stable Post Pull Site:  Level  0 Post Pull Instructions Given:  yes Post Pull Pulses Present: yes Dressing Applied:  tegaderm Bedrest begins @ [id number] Comments:

## 2015-04-25 NOTE — Discharge Instructions (Signed)

## 2015-04-26 MED FILL — Heparin Sodium (Porcine) Inj 1000 Unit/ML: INTRAMUSCULAR | Qty: 10 | Status: AC

## 2015-05-24 DIAGNOSIS — I739 Peripheral vascular disease, unspecified: Secondary | ICD-10-CM | POA: Diagnosis not present

## 2015-05-28 NOTE — H&P (Signed)
OFFICE VISIT NOTES COPIED TO EPIC FOR DOCUMENTATION  . Shawn Schmidt 03/01/2015 8:28 AM Location: [facility] Patient #: [id number] DOB: 25-Aug-1940 Married / Language: English / Race: Black or African American Male   History of Present Illness Shawn Schmidt AGNP-C; 03/01/2015 12:47 PM) The patient is a 75 year old male who presents for a Follow-up for CAD. He had initially presented to establish care for continued cardiac follow-up and management. He currently has a home here and at [facility]. He had ST elevation MI at [facility] on November 28th, 2015 with symptoms of fatigue and heaviness in his chest. He called EMS and was told he was having a heart attack and was taken straight to the cath lab. He underwent PCI to the mid and proximal LAD by placement of 2 overlapping mid LAD stents.   At his last vist 1 month ago, echo was ordered to reevaluate heart function. This revealed persistently decreased EF at 40% and global hypokinesis. He was then scheduled for nuclear stress test which was markedly abnormal, therefore was scheduled for coronary angiogram to evaluate for progression of CAD. He presents today for follow up.  He denies any recurrence of chest pain, he denies SOB, PND, orthopnea, dizziness, syncope, or symptoms suggestive of TIA. He had initially denied claudication, however, during exercise portion of stress test, he reported significant weakness and discomfort in bilateral calves and now admits to cramping and fatigue but does not do much fast walking or walk uphill. He denies a history of hyperlipidemia, HTN, or diabetes. He was previously smoking 2 ppd of cigarettes and has now been abstinant from tobacco for 3 months.   Problem List/Past Medical (April Louretta Shorten; 03/01/2015 9:37 AM) Atherosclerosis of native coronary artery of native heart without angina pectoris (I25.10) [place] Acute anterior MI: Stenting with 3.5 x 12 mm proximal and 3.5  x 32 mm promos Premier DES in the mid LAD. 50-60% mid circumflex stenosis, nondominant right with a mid 85% stenosis. Exercise myoview stress 01/20/2015: 1. The resting electrocardiogram demonstrated normal sinus rhythm, normal resting conduction and no resting arrhythmias. Anterior infarct, old. Stress EKG shows PVC, ventricular couplets. No ST-T changes of ischemia. The patient performed treadmill exercise using a Bruce protocol, completing 3:48 minutes. The patient completed an estimated workload of 5.59 METS, 86% of the maximum predicted heart rate. The stress test was terminated because of fatigue and leg claudication. 2. Perfusion imaging study demonstrates the left ventricle to be slightly dilated and stress images compared to rest images. There is a moderate sized mid to distal anterior anteroapical and anterolateral decreased uptake suggestive of which are of scar moderate to severe ischemia especially in the anterolateral wall. Apex appears to be scarred. There is also a small sized inferior wall scar without ischemia extending from the mid ventricle towards the apex. Left ventricular systolic function calculated by QGS was 41% with anterolateral hypokinesis and apical and apical inferior akinesis. This represents a high risk study. Consider further cardiac workup. Essential hypertension, benign (I10) Hyperlipidemia, group A (E78.00) Claudication, intermittent (I73.9) Gastroesophageal reflux disease, esophagitis presence not specified (K21.9) History of MI (myocardial infarction) (I25.2)03/12/2014 03/12/2014: At [facility] [place] S/P angioplasty with stent (Z95.9)03/12/2014 [facility] Acute anterior MI 03/12/2014: Stenting with 3.5 x 12 mm proximal and 3.5 x 32 mm promos Premier DES in the mid LAD . Tobacco use disorder, continuous (F17.209)  Allergies ([name]; 03/01/2015 9:35 AM) Lisinopril *ANTIHYPERTENSIVES*04/04/2014 Cough. Simvastatin *ANTIHYPERLIPIDEMICS*04/04/2014 Leg  cramps  Family History (April  Louretta Shorten; 03/01/2015 9:35 AM) Mother Deceased. at age 54 from natural causes; had hx of htn Father Deceased. at age 6 from drowning. hx of htn Siblings 4 brothers(all deceased), 2 sisters , one sister living. Medical history unknown of siblings  Social History ([name]; 03/01/2015 9:35 AM) Current tobacco use Former smoker. Quit December 06, 2014 Marital status Married. Living Situation Lives with spouse. he is the caregiver Number of Children 2. Non Drinker/No Alcohol Use  Past Surgical History (April Louretta Shorten; 03/01/2015 9:35 AM) None12/21/2015  Medication History ([name]; 03/01/2015 9:41 AM) Atorvastatin Calcium ('40MG'$  Tablet, 1 (one) Tablet Oral daily, Taken starting 02/09/2015) Active. RaNITidine HCl ('150MG'$  Tablet, 1 (one) Tablet Oral daily as needed, Taken starting 02/01/2015) Active. Carvedilol (3.'125MG'$  Tablet, 1 Tablet Tablet Oral two times daily, Taken starting 12/27/2014) Active. Valsartan-Hydrochlorothiazide (320-'25MG'$  Tablet, 1 (one) Tablet Tablet Tablet T Oral daily, Taken starting 04/25/2014) Discontinued: Patient Choice. Nitrostat (0.'4MG'$  Tab Sublingual, 1 Sublingual as needed) Active. Aspirin ('81MG'$  Tablet, 1 (one) Oral daily) Active. Zetia ('10MG'$  Tablet, 1 Oral at bedtime) Active. Clopidogrel Bisulfate ('75MG'$  Tablet, 1 Oral daily in the morning) Active. Medications Reconciled (verbally with patient/ list present)  Diagnostic Studies History (Bridgette Ebony Hail, AGNP-C; 03/01/2015 9:48 AM) Worthy Keeler 02/15/2015: Total cholesterol 129, triglycerides 57, HDL 54, LDL 64, LDL particle # 636, serum glucose 121, creatinine 1.37, eGFR 59, potassium 4.0, CBC normal, TSH 1.67, PT 10.8, INR 1.0 06/27/2014: Creatinine 1.20, eGFR 70, BMP normal 05/09/2014: Total cholesterol 130, triglycerides 74, HDL 50, LDL 65, LDL particle # 589, LP-IR score 31, Serum glucose 104, creatinine 1.46, eGFR 55, potassium 4.6, CMP otherwise normal  03/13/2014: total cholesterol 177, triglycerides 100, HDL 61, LDL 113, CBC normal, potassium 3.4, creatinine 1.20, eGFR 70, CMP otherwise normal Lower Extremity Dopplers10/27/2016 Significant velocity increase at the right mid superficial femoral artery suggesting >50% stenosis. Diffuse disease in left leg. This exam reveals moderately decreased perfusion of the lower extremity bilaterally, RABI 0.73 and Left at 0.53. Coronary Angiogram11/11/2014 1. Widely patent mid LAD stent, 3.5 x 12 and 3.5 x 32 mm Promus placed on 03/12/2014 for acute MI, type III LAD which supplies essentially the entire anterolateral wall and inferior wall. 2. Moderate stenosis in the mid circumflex, 50%. Nondominant RCA. 3. Ischemic cardiomyopathy, LVEF 30-35% with mid to distal anterior anteroapical, apical, apical inferior, mid inferior severe hypokinesis to akinesis. 4. Limited abdominal aortogram with bifemoral runoff reveals widely patent iliac vessels, profunda femoral vessel, moderately diseased bilateral internal iliac, severely diseased bilateral profunda femoral arteries. Nuclear stress test10/10/2014 1. The resting electrocardiogram demonstrated normal sinus rhythm, normal resting conduction and no resting arrhythmias. Anterior infarct, old. Stress EKG shows PVC, ventricular couplets. No ST-T changes of ischemia. The patient performed treadmill exercise using a Bruce protocol, completing 3:48 minutes. The patient completed an estimated workload of 5.59 METS, 86% of the maximum predicted heart rate. The stress test was terminated because of fatigue and leg claudication. 2. Perfusion imaging study demonstrates the left ventricle to be slightly dilated and stress images compared to rest images. There is a moderate sized mid to distal anterior anteroapical and anterolateral decreased uptake suggestive of which are of scar moderate to severe ischemia especially in the anterolateral wall. Apex appears to be scarred. There is also  a small sized inferior wall scar without ischemia extending from the mid ventricle towards the apex. Left ventricular systolic function calculated by QGS was 41% with anterolateral hypokinesis and apical and apical inferior akinesis. This represents a high risk study. Consider further cardiac workup.  Echocardiogram09/21/2016 Left ventricle cavity is normal in size. Moderate concentric hypertrophy of the left ventricle. Doppler evidence of grade I (impaired) diastolic dysfunction. Left ventricle regional wall motion findings: Mid anteroseptal, Mid anterior, Apical anterior, Apical lateral, Apical inferior, Apical septal and Apical cap akinesis. Moderate LV systolic dysfunction. Visual EF is 40%. Mild mitral regurgitation. Mild tricuspid regurgitation. No evidence of pulmonary hypertension.  Other Problems (April Louretta Shorten; 03/01/2015 9:37 AM) Unspecified Diagnosis    Review of Systems [facility] Ebony Hail, [place]; 03/01/2015 12:47 PM) General Present- Feeling well. Not Present- Anorexia, Fatigue and Fever. Respiratory Not Present- Cough, Decreased Exercise Tolerance and Dyspnea. Cardiovascular Present- Claudications (only during stress test). Not Present- Chest Pain, Edema, Orthopnea, Paroxysmal Nocturnal Dyspnea and Shortness of Breath. Gastrointestinal Not Present- Change in Bowel Habits, Constipation and Nausea. Neurological Not Present- Focal Neurological Symptoms. Endocrine Not Present- Appetite Changes, Cold Intolerance and Heat Intolerance. Hematology Not Present- Anemia, Petechiae and Prolonged Bleeding.  Vitals ([name]; 03/01/2015 9:42 AM) 03/01/2015 9:37 AM Weight: 171.38 lb Height: 69in Body Surface Area: 1.93 m Body Mass Index: 25.31 kg/m  Pulse: 52 (Regular)  P.OX: 98% (Room air) BP: 128/62 (Sitting, Left Arm, Standard)       Physical Exam (Bridgette Ebony Hail AGNP-C; 03/01/2015 12:49 PM) General Mental Status-Alert. General Appearance-Cooperative,  Appears stated age, Not in acute distress. Orientation-Oriented X3. Build & Nutrition-Well built and Well nourished.  Head and Neck Thyroid Gland Characteristics - no palpable nodules, no palpable enlargement.  Chest and Lung Exam Palpation Tender - No chest wall tenderness. Auscultation Adventitious sounds - Expiratory wheeze - Left Lung Field.  Cardiovascular Inspection Jugular vein - Right - No Distention. Auscultation Heart Sounds - S1 WNL, S2 WNL and No gallop present. Murmurs & Other Heart Sounds - Murmur - No murmur.  Abdomen Palpation/Percussion Normal exam - Non Tender and No hepatosplenomegaly. Auscultation Normal exam - Bowel sounds normal.  Peripheral Vascular Lower Extremity Inspection - Left - No Pigmentation, No Varicose veins. Right - No Pigmentation, No Varicose veins. Palpation - Edema - Left - No edema. Right - No edema. Femoral pulse - Left - Normal. Right - 2+ and Bruit. Popliteal pulse - Bilateral - Feeble. Dorsalis pedis pulse - Left - 0+. Right - 1+. Posterior tibial pulse - Bilateral - 0+. Carotid arteries - Left-No Carotid bruit. Carotid arteries - Right-No Carotid bruit. Abdomen-No prominent abdominal aortic pulsation, No epigastric bruit. Note: Right radial access site healed well, asymptomatic   Neurologic Motor-Grossly intact without any focal deficits.  Musculoskeletal Global Assessment Left Lower Extremity - normal range of motion without pain. Right Lower Extremity - normal range of motion without pain.    Assessment & Plan (Devonna Shumate; 03/01/2015 10:35 AM)  Atherosclerosis of native coronary artery of native heart without angina pectoris (I25.10) Story: [place] Acute anterior MI: Stenting with 3.5 x 12 mm proximal and 3.5 x 32 mm promos Premier DES in the mid LAD. 50-60% mid circumflex stenosis, nondominant right with a mid 85% stenosis.  Coronary Angiogram 02/21/2015 1. Widely patent mid LAD stent, 3.5 x 12  and 3.5 x 32 mm Promus placed on 03/12/2014 for acute MI, type III LAD which supplies essentially the entire anterolateral wall and inferior wall. 2. Moderate stenosis in the mid circumflex, 50%. Nondominant RCA. 3. Ischemic cardiomyopathy, LVEF 30-35% with mid to distal anterior anteroapical, apical, apical inferior, mid inferior severe hypokinesis to akinesis. 4. Limited abdominal aortogram with bifemoral runoff reveals widely patent iliac vessels, profunda femoral vessel, moderately diseased bilateral internal iliac, severely diseased  bilateral profunda femoral artery  Exercise myoview stress 01/20/2015: 1. The resting electrocardiogram demonstrated normal sinus rhythm, normal resting conduction and no resting arrhythmias. Anterior infarct, old. Stress EKG shows PVC, ventricular couplets. No ST-T changes of ischemia. The patient performed treadmill exercise using a Bruce protocol, completing 3:48 minutes. The patient completed an estimated workload of 5.59 METS, 86% of the maximum predicted heart rate. The stress test was terminated because of fatigue and leg claudication. 2. Perfusion imaging study demonstrates the left ventricle to be slightly dilated and stress images compared to rest images. There is a moderate sized mid to distal anterior anteroapical and anterolateral decreased uptake suggestive of which are of scar moderate to severe ischemia especially in the anterolateral wall. Apex appears to be scarred. There is also a small sized inferior wall scar without ischemia extending from the mid ventricle towards the apex. Left ventricular systolic function calculated by QGS was 41% with anterolateral hypokinesis and apical and apical inferior akinesis. This represents a high risk Impression: EKG 04/04/2014: Sinus bradycardia at the rate of 45 bpm, inferior infarct, anterior infarct old. Cannot exclude inferior and anterolateral ischemia. Abnormal EKG. No significant change from EKG on  04/04/2014. History of MI (myocardial infarction) (I25.2) Story: 03/12/2014: At [facility] [place], [place]  Essential hypertension, benign (I10)  PAD (peripheral artery disease) (I73.9) Story: Lower extremity arterial duplex 02/09/2015: Significant velocity increase at the right mid superficial femoral artery suggesting >50% stenosis. Diffuse disease in left leg. This exam reveals moderately decreased perfusion of the lower extremity bilaterally, RABI 0.73 and Left at 0.53. Current Plans Pt Education - Peripheral Artery Disease: arteries Future Plans 35/32/9924: METABOLIC PANEL, BASIC (26834) - one time 04/13/2015: CBC & PLATELETS (AUTO) (19622) - one time 04/13/2015: PT (PROTHROMBIN TIME) (29798) - one time  Hyperlipidemia, group A (E78.00)  Current Plans Mechanism of underlying disease process and action of medications discussed with the patient. I discussed primary/secondary prevention and also dietary counseling was done. He presents for follow-up after coronary angiogram performed due to persistently decreased LVEF and abnormal nuclear stress test. Coronary angiogram revealed widely patent mid LAD stent placed on 03/12/2014 for acute MI, type III LAD which supplies essentially the entire anterolateral wall and inferior wall, and confirmed ischemic cardiomyopathy, LVEF 30-35% with mid to distal anterior anteroapical, apical, apical inferior, mid inferior severe hypokinesis to akinesis. Limited abdominal aortogram with bifemoral runoff revealed widely patent iliac vessels, profunda femoral vessel, moderately diseased bilateral internal iliac, and severely diseased bilateral profunda femoral artery. He underwent LE duplex which revealed significant velocity increase at the right mid superficial femoral artery suggesting >50% stenosis, diffuse disease in left leg, with RABI of 0.73 and LABI of 0.53. He denied any symptoms of claudication until his stress test, however now on further questioning, admits to  significant fatigue and bilateral thighs with exertion. Needs to schedule for peripheral arteriogram and possible angioplasty given symptoms. Patient understands the risks, benefits, alternatives including medical therapy, CT angiography. Patient understands <1-2% risk of death, embolic complications, bleeding, infection, renal failure, urgent surgical revascularization, but not limited to these and wants to proceed.  He also reports diffuse muscle aches in his arms and legs, typically at night, and is questioning if this could be related to medication. Will change atorvastatin to Crestor to see if this helps with the symptoms. Otherwise, no changes in medications were made. I'll see him back following PV angiogram.  Discontinued Atorvastatin Calcium '40MG'$  (myalgia). Started Crestor '20MG'$ , 1 (one) Tablet daily, #30, 03/01/2015, Ref. x4. Addendum  Note(Bridgette Allison AGNP-C; 04/19/2015 2:36 PM) 04/18/2014:0 glucose 113, creatinine 1.36, eGFR 60, CBC normal, PT/INR normal  Labs stable to proceed with PV angio  Signed by Shawn Schmidt, AGNP-C (03/01/2015 12:49 PM)

## 2015-05-30 ENCOUNTER — Encounter (HOSPITAL_COMMUNITY)
Admission: RE | Disposition: A | Discharge status: Home / Self Care | Payer: Self-pay | Source: Ambulatory Visit | Attending: Cardiology

## 2015-05-30 ENCOUNTER — Encounter (HOSPITAL_COMMUNITY): Payer: Self-pay | Admitting: Cardiology

## 2015-05-30 ENCOUNTER — Ambulatory Visit (HOSPITAL_COMMUNITY)
Admission: RE | Admit: 2015-05-30 | Discharge: 2015-05-30 | Disposition: A | Discharge status: Home / Self Care | Payer: PPO | Source: Ambulatory Visit | Attending: Cardiology | Admitting: Cardiology

## 2015-05-30 DIAGNOSIS — Z7982 Long term (current) use of aspirin: Secondary | ICD-10-CM | POA: Insufficient documentation

## 2015-05-30 DIAGNOSIS — Z87891 Personal history of nicotine dependence: Secondary | ICD-10-CM | POA: Diagnosis not present

## 2015-05-30 DIAGNOSIS — Z955 Presence of coronary angioplasty implant and graft: Secondary | ICD-10-CM | POA: Insufficient documentation

## 2015-05-30 DIAGNOSIS — I70212 Atherosclerosis of native arteries of extremities with intermittent claudication, left leg: Secondary | ICD-10-CM | POA: Insufficient documentation

## 2015-05-30 DIAGNOSIS — I10 Essential (primary) hypertension: Secondary | ICD-10-CM | POA: Insufficient documentation

## 2015-05-30 DIAGNOSIS — I252 Old myocardial infarction: Secondary | ICD-10-CM | POA: Insufficient documentation

## 2015-05-30 DIAGNOSIS — I2510 Atherosclerotic heart disease of native coronary artery without angina pectoris: Secondary | ICD-10-CM | POA: Insufficient documentation

## 2015-05-30 DIAGNOSIS — I70213 Atherosclerosis of native arteries of extremities with intermittent claudication, bilateral legs: Secondary | ICD-10-CM | POA: Diagnosis not present

## 2015-05-30 DIAGNOSIS — I739 Peripheral vascular disease, unspecified: Secondary | ICD-10-CM | POA: Diagnosis present

## 2015-05-30 DIAGNOSIS — Z8249 Family history of ischemic heart disease and other diseases of the circulatory system: Secondary | ICD-10-CM | POA: Diagnosis not present

## 2015-05-30 DIAGNOSIS — E785 Hyperlipidemia, unspecified: Secondary | ICD-10-CM | POA: Insufficient documentation

## 2015-05-30 DIAGNOSIS — I255 Ischemic cardiomyopathy: Secondary | ICD-10-CM | POA: Diagnosis not present

## 2015-05-30 DIAGNOSIS — K219 Gastro-esophageal reflux disease without esophagitis: Secondary | ICD-10-CM | POA: Diagnosis not present

## 2015-05-30 HISTORY — PX: PERIPHERAL VASCULAR CATHETERIZATION: SHX172C

## 2015-05-30 LAB — POCT ACTIVATED CLOTTING TIME
ACTIVATED CLOTTING TIME: 276 s
ACTIVATED CLOTTING TIME: 235 s
ACTIVATED CLOTTING TIME: 209 s
Activated Clotting Time: 281 seconds

## 2015-05-30 SURGERY — ATHERECTOMY PERIPHERAL ARTERY

## 2015-05-30 MED ORDER — HEPARIN (PORCINE) IN NACL 2-0.9 UNIT/ML-% IJ SOLN
INTRAMUSCULAR | Status: AC
  Filled 2015-05-30: qty 1000

## 2015-05-30 MED ORDER — MIDAZOLAM HCL 2 MG/2ML IJ SOLN
INTRAMUSCULAR | Status: AC
  Filled 2015-05-30: qty 2

## 2015-05-30 MED ORDER — SODIUM CHLORIDE 0.9 % IV SOLN: INTRAVENOUS | Status: DC

## 2015-05-30 MED ORDER — IODIXANOL 320 MG/ML IV SOLN
INTRAVENOUS | Status: DC | PRN
  Administered 2015-05-30: 85 mL via INTRAVENOUS

## 2015-05-30 MED ORDER — HEPARIN SODIUM (PORCINE) 1000 UNIT/ML IJ SOLN
INTRAMUSCULAR | Status: AC
  Filled 2015-05-30: qty 1

## 2015-05-30 MED ORDER — NITROGLYCERIN IN D5W 200-5 MCG/ML-% IV SOLN
INTRAVENOUS | Status: AC
  Filled 2015-05-30: qty 250

## 2015-05-30 MED ORDER — VERAPAMIL HCL 2.5 MG/ML IV SOLN
INTRAVENOUS | Status: AC
  Filled 2015-05-30: qty 2

## 2015-05-30 MED ORDER — LIDOCAINE HCL (PF) 1 % IJ SOLN
INTRAMUSCULAR | Status: AC
  Filled 2015-05-30: qty 30

## 2015-05-30 MED ORDER — MIDAZOLAM HCL 2 MG/2ML IJ SOLN
INTRAMUSCULAR | Status: DC | PRN
  Administered 2015-05-30: 1 mg via INTRAVENOUS

## 2015-05-30 MED ORDER — NITROGLYCERIN 1 MG/10 ML FOR IR/CATH LAB
INTRA_ARTERIAL | Status: DC | PRN
  Administered 2015-05-30: 200 ug
  Administered 2015-05-30: 300 mL
  Administered 2015-05-30: 200 ug
  Administered 2015-05-30: 600 mL
  Administered 2015-05-30: 200 ug

## 2015-05-30 MED ORDER — FENTANYL CITRATE (PF) 100 MCG/2ML IJ SOLN
INTRAMUSCULAR | Status: DC | PRN
  Administered 2015-05-30: 25 ug via INTRAVENOUS

## 2015-05-30 MED ORDER — HEPARIN SODIUM (PORCINE) 1000 UNIT/ML IJ SOLN
INTRAMUSCULAR | Status: DC | PRN
  Administered 2015-05-30: 1500 [IU] via INTRAVENOUS
  Administered 2015-05-30: 1000 [IU] via INTRAVENOUS
  Administered 2015-05-30: 2000 [IU] via INTRAVENOUS
  Administered 2015-05-30: 8000 [IU] via INTRAVENOUS

## 2015-05-30 MED ORDER — SODIUM CHLORIDE 0.9 % IV BOLUS (SEPSIS)
500.0000 mL | Freq: Once | INTRAVENOUS | Status: AC
  Administered 2015-05-30: 500 mL via INTRAVENOUS

## 2015-05-30 MED ORDER — HEPARIN (PORCINE) IN NACL 2-0.9 UNIT/ML-% IJ SOLN
INTRAMUSCULAR | Status: DC | PRN
  Administered 2015-05-30: 07:00:00

## 2015-05-30 MED ORDER — LIDOCAINE HCL (PF) 1 % IJ SOLN
INTRAMUSCULAR | Status: DC | PRN
  Administered 2015-05-30: 20 mL

## 2015-05-30 MED ORDER — SODIUM CHLORIDE 0.9 % IV SOLN
1.0000 mL/kg/h | INTRAVENOUS | Status: DC
  Administered 2015-05-30: 1 mL/kg/h via INTRAVENOUS

## 2015-05-30 MED ORDER — FENTANYL CITRATE (PF) 100 MCG/2ML IJ SOLN
INTRAMUSCULAR | Status: AC
  Filled 2015-05-30: qty 2

## 2015-05-30 SURGICAL SUPPLY — 26 items
BALLN ARMADA 3.0X60X150 (BALLOONS) ×4
BALLN LUTONIX DCB 5X100X130 (BALLOONS) ×4
BALLN LUTONIX DCB 5X80X130 (BALLOONS) ×4
BALLOON ARMADA 3.0X60X150 (BALLOONS) ×2 IMPLANT
BALLOON LUTONIX DCB 5X100X130 (BALLOONS) ×2 IMPLANT
BALLOON LUTONIX DCB 5X80X130 (BALLOONS) ×2 IMPLANT
CATH CROSS OVER TEMPO 5F (CATHETERS) ×4 IMPLANT
CATH CXI SUPP ANG 2.6FR 150CM (MICROCATHETER) ×4 IMPLANT
DIAMONDBACK SOLID OAS 1.5MM (CATHETERS) ×4
GUIDEWIRE REGALIA .014X300CM (WIRE) ×4 IMPLANT
KIT ENCORE 26 ADVANTAGE (KITS) ×4 IMPLANT
KIT MICROINTRODUCER STIFF 5F (SHEATH) ×8 IMPLANT
KIT PV (KITS) ×4 IMPLANT
LUBRICANT VIPERSLIDE CORONARY (MISCELLANEOUS) ×4 IMPLANT
SHEATH HIGHFLEX ANSEL 7FR 55CM (SHEATH) ×4 IMPLANT
SHEATH PINNACLE 5F 10CM (SHEATH) ×4 IMPLANT
SHEATH PINNACLE 7F 10CM (SHEATH) ×4 IMPLANT
STENT SUPERA 5.0X100X120 (Permanent Stent) ×4 IMPLANT
SYSTEM DIMNDBCK SLD OAS 1.5MM (CATHETERS) ×2 IMPLANT
TAPE RADIOPAQUE TURBO (MISCELLANEOUS) ×8 IMPLANT
TRANSDUCER W/STOPCOCK (MISCELLANEOUS) ×4 IMPLANT
TRAY PV CATH (CUSTOM PROCEDURE TRAY) ×4 IMPLANT
TUBING CIL FLEX 10 FLL-RA (TUBING) ×4 IMPLANT
WIRE HITORQ VERSACORE ST 145CM (WIRE) ×4 IMPLANT
WIRE SPARTACORE .014X300CM (WIRE) ×4 IMPLANT
WIRE VIPER ADVANCE .017X335CM (WIRE) ×4 IMPLANT

## 2015-05-30 NOTE — Discharge Instructions (Signed)
Angiogram, Care After °Refer to this sheet in the next few weeks. These instructions provide you with information about caring for yourself after your procedure. Your health care provider may also give you more specific instructions. Your treatment has been planned according to current medical practices, but problems sometimes occur. Call your health care provider if you have any problems or questions after your procedure. °WHAT TO EXPECT AFTER THE PROCEDURE °After your procedure, it is typical to have the following: °· Bruising at the catheter insertion site that usually fades within 1-2 weeks. °· Blood collecting in the tissue (hematoma) that may be painful to the touch. It should usually decrease in size and tenderness within 1-2 weeks. °HOME CARE INSTRUCTIONS °· Take medicines only as directed by your health care provider. °· You may shower 24-48 hours after the procedure or as directed by your health care provider. Remove the bandage (dressing) and gently wash the site with plain soap and water. Pat the area dry with a clean towel. Do not rub the site, because this may cause bleeding. °· Do not take baths, swim, or use a hot tub until your health care provider approves. °· Check your insertion site every day for redness, swelling, or drainage. °· Do not apply powder or lotion to the site. °· Do not lift over 10 lb (4.5 kg) for 5 days after your procedure or as directed by your health care provider. °· Ask your health care provider when it is okay to: °¨ Return to work or school. °¨ Resume usual physical activities or sports. °¨ Resume sexual activity. °· Do not drive home if you are discharged the same day as the procedure. Have someone else drive you. °· You may drive 24 hours after the procedure unless otherwise instructed by your health care provider. °· Do not operate machinery or power tools for 24 hours after the procedure or as directed by your health care provider. °· If your procedure was done as an  outpatient procedure, which means that you went home the same day as your procedure, a responsible adult should be with you for the first 24 hours after you arrive home. °· Keep all follow-up visits as directed by your health care provider. This is important. °SEEK MEDICAL CARE IF: °· You have a fever. °· You have chills. °· You have increased bleeding from the catheter insertion site. Hold pressure on the site.  CALL 911 °SEEK IMMEDIATE MEDICAL CARE IF: °· You have unusual pain at the catheter insertion site. °· You have redness, warmth, or swelling at the catheter insertion site. °· You have drainage (other than a small amount of blood on the dressing) from the catheter insertion site. °· The catheter insertion site is bleeding, and the bleeding does not stop after 30 minutes of holding steady pressure on the site. °· The area near or just beyond the catheter insertion site becomes pale, cool, tingly, or numb. °  °This information is not intended to replace advice given to you by your health care provider. Make sure you discuss any questions you have with your health care provider. °  °Document Released: 10/18/2004 Document Revised: 04/22/2014 Document Reviewed: 09/02/2012 °Elsevier Interactive Patient Education ©2016 [employer] ° °

## 2015-05-30 NOTE — Progress Notes (Signed)
Up and walked and c/o right leg being sore; no bleeding or hematoma right groin

## 2015-05-30 NOTE — Interval H&P Note (Signed)
History and Physical Interval Note:  05/30/2015 7:45 AM  Shawn Schmidt  has presented today for surgery, with the diagnosis of pvd  The various methods of treatment have been discussed with the patient and family. After consideration of risks, benefits and other options for treatment, the patient has consented to  Procedure(s): Lower Extremity Angiography (N/A) and possible PTA as a surgical intervention .  The patient's history has been reviewed, patient examined, no change in status, stable for surgery.  I have reviewed the patient's chart and labs.  Questions were answered to the patient's satisfaction.     [name]

## 2015-05-30 NOTE — Progress Notes (Signed)
Dr Einar Gip notified of client c/o right leg being sore; states does not want to stay in hospital tonight and states does not need pain medication for soreness; right leg warm color pink

## 2015-05-30 NOTE — Progress Notes (Signed)
Site area: right groin a 7 french arterial sheath was removed  Site Prior to Removal:  Level 0  Pressure Applied For 30 MINUTES    Minutes Beginning at 1240p  Manual:   Yes.    Patient Status During Pull:  stable  Post Pull Groin Site:  Level 0  Post Pull Instructions Given:  Yes.    Post Pull Pulses Present:  Yes.    Dressing Applied:  Yes.    Comments:  VS remain stable during sheath pull.

## 2015-05-31 ENCOUNTER — Encounter (HOSPITAL_COMMUNITY): Payer: Self-pay | Admitting: Cardiology

## 2015-05-31 MED FILL — Nitroglycerin IV Soln 200 MCG/ML in D5W: INTRAVENOUS | Qty: 250 | Status: AC

## 2015-05-31 MED FILL — Verapamil HCl IV Soln 2.5 MG/ML: INTRAVENOUS | Qty: 2 | Status: AC

## 2015-06-08 DIAGNOSIS — I10 Essential (primary) hypertension: Secondary | ICD-10-CM | POA: Diagnosis not present

## 2015-06-08 DIAGNOSIS — E7800 Pure hypercholesterolemia, unspecified: Secondary | ICD-10-CM | POA: Diagnosis not present

## 2015-06-08 DIAGNOSIS — I739 Peripheral vascular disease, unspecified: Secondary | ICD-10-CM | POA: Diagnosis not present

## 2015-06-21 DIAGNOSIS — I2510 Atherosclerotic heart disease of native coronary artery without angina pectoris: Secondary | ICD-10-CM | POA: Diagnosis not present

## 2015-06-21 DIAGNOSIS — I252 Old myocardial infarction: Secondary | ICD-10-CM | POA: Diagnosis not present

## 2015-06-21 DIAGNOSIS — E7800 Pure hypercholesterolemia, unspecified: Secondary | ICD-10-CM | POA: Diagnosis not present

## 2015-06-21 DIAGNOSIS — I739 Peripheral vascular disease, unspecified: Secondary | ICD-10-CM | POA: Diagnosis not present

## 2015-06-25 NOTE — H&P (Signed)
Shawn Schmidt 06/08/2015 9:37 AM Location: [facility] Patient #: [phone] DOB: [birth year] Married / Language: English / Race: Black or African American Male   History of Present Illness Shawn Page MD; 06/08/2015 7:00 PM) Patient words: 7-10 f/u pv cath; last o/v 03-01-2015;. The patient is a 75 year old male who presents for a Follow-up for Peripheral vascular disease.  Underwent peripheral arteriogram on 04/25/2011 and angioplasty to the left SFA and popliteal artery.  No scheduled for right SFA angioplasty. He has history of CAD with presentation with ST elevation MI  at [facility] on November 28th, 2015 with symptoms of  fatigue and  heaviness in his chest. underwent PCI to the mid and proximal LAD by placement of 2 overlapping mid LAD stents.  He has history of hypertension, hyperlipidemia and chronic renal insufficiency.  He now presents here for follow-up of peripheral arterial disease and claudication.  Due to chronic renal insufficiency his procedures were being staged.  Daughter present at the bedside.   On discharge I discontinued valsartan HCT due to chronic renal insufficiency.  He is remained abstinent from tobacco use over the past 6 months now.   Problem List/Past Medical ([name]; 06/08/2015 11:34 AM) Atherosclerosis of native coronary artery of native heart without angina pectoris (I25.10)  [place] Acute anterior MI: Stenting with 3.5 x 12 mm proximal and 3.5 x 32 mm promos Premier DES in the mid LAD. 50-60% mid circumflex stenosis, nondominant right with a mid 85% stenosis. Coronary Angiogram 02/21/2015 1. Widely patent mid LAD stent, 3.5 x 12 and 3.5 x 32 mm Promus placed on 03/12/2014 for acute MI, type III LAD which supplies essentially the entire anterolateral wall and inferior wall. 2. Moderate stenosis in the mid circumflex, 50%. Nondominant RCA. 3. Ischemic cardiomyopathy, LVEF 30-35% with mid to distal anterior anteroapical, apical,  apical inferior, mid inferior  severe hypokinesis to akinesis. 4. Limited abdominal aortogram with bifemoral runoff reveals widely patent iliac vessels, profunda femoral vessel, moderately diseased bilateral internal iliac, severely diseased bilateral profunda femoral artery Exercise myoview stress 01/20/2015: 1. The resting electrocardiogram demonstrated normal sinus rhythm, normal resting conduction and no resting arrhythmias. Anterior infarct, old. Stress EKG shows PVC, ventricular couplets. No ST-T changes of ischemia. The patient performed treadmill exercise using a Bruce protocol, completing 3:48 minutes. The patient completed an estimated workload of 5.59 METS, 86% of the maximum predicted heart rate. The stress test was terminated because of fatigue and leg claudication. 2. Perfusion imaging study demonstrates the left ventricle to be slightly dilated and stress images compared to rest images. There is a moderate sized mid to distal anterior anteroapical and anterolateral decreased uptake suggestive of which are of scar moderate to severe ischemia especially in the anterolateral wall. Apex appears to be scarred. There is also a small sized inferior wall scar without ischemia extending from the mid ventricle towards the apex. Left ventricular systolic function calculated by QGS was 41% with anterolateral hypokinesis and apical and apical inferior akinesis. This represents a high risk Essential hypertension, benign (I10)  Tobacco use disorder, continuous (F17.209)  Hyperlipidemia, group A (E78.00)  PAD (peripheral artery disease) (I73.9)  Lower extremity arterial duplex 02/09/2015: Significant velocity increase at the right mid superficial femoral artery suggesting >50% stenosis. Diffuse disease in left leg. This exam reveals moderately decreased perfusion of the lower extremity bilaterally, RABI 0.73 and Left at 0.53. Gastroesophageal reflux disease, esophagitis presence not specified (K21.9)  History  of MI (myocardial infarction) (I25.2) 03/12/2014  03/12/2014: At [facility] [place] S/P angioplasty with stent (Z95.9) 03/12/2014 [place] Acute anterior MI 03/12/2014: Stenting with 3.5 x 12 mm proximal and 3.5 x 32 mm promos Premier DES in the mid LAD .  Allergies ([name]; 2015/06/15 11:34 AM) Lisinopril *ANTIHYPERTENSIVES* 04/04/2014 Cough. Simvastatin *ANTIHYPERLIPIDEMICS* 04/04/2014 Leg cramps  Family History ([name]; 06-15-15 11:34 AM) Mother  Deceased. at age 13 from natural causes; had hx of htn Father  Deceased. at age 57 from drowning. hx of htn Siblings  4 brothers(all deceased), 2 sisters , one sister living. Medical history unknown of siblings  Social History ([name]; 06/15/2015 11:34 AM) Current tobacco use  Former smoker. Quit December 06, 2014 Marital status  Married. Living Situation  Lives with spouse. he is the caregiver Number of Children  2. Non Drinker/No Alcohol Use   Past Surgical History ([name]; 06/15/15 11:34 AM) None 04/04/2014  Medication History ([name]; 2015-06-15 11:42 AM) Crestor  ('20MG'$  Tablet, 1 (one) Tablet Oral daily, Taken starting 03/01/2015) Active. RaNITidine HCl  ('150MG'$  Tablet, 1 (one) Tablet Tablet Oral daily as needed, Taken starting 02/01/2015) Active. Carvedilol  (3.'125MG'$  Tablet, 1 Tablet Tablet Tablet Oral two times daily, Taken starting 12/27/2014) Active. Nitrostat  (0.'4MG'$  Tab Sublingual, 1 Sublingual as needed) Active. Aspirin  ('81MG'$  Tablet, 1 (one) Oral daily) Active. Zetia  ('10MG'$  Tablet, 1 Oral at bedtime) Discontinued: Pt Unsure. Clopidogrel Bisulfate  ('75MG'$  Tablet, 1 Oral daily in the morning) Active. Medications Reconciled  (verbally with patient/ list present)  Diagnostic Studies History ([name]; 2015/06/15 11:35 AM) Peripheral Arteriogram 04/25/2015 Left SFA tandem 95%, Popliteal tandem 99%, PT proximal 99% (single vessel R/O). Right SFA focal 95%. PV Cath 05/30/2015 Labwork   05/24/2015: Creatinine 1.42, eGFR 57, potassium 4.3, CBC normal, PT/INR normal 04/18/2014: serum glucose 113, creatinine 1.36, eGFR 60, CBC normal, PT/INR normal 02/15/2015: Total cholesterol 129, triglycerides 57, HDL 54, LDL 64, LDL particle # 636, serum glucose 121, creatinine 1.37, eGFR 59, potassium 4.0, CBC normal, TSH 1.67, PT 10.8, INR 1.0 06/27/2014: Creatinine 1.20, eGFR 70, BMP normal 05/09/2014: Total cholesterol 130, triglycerides 74, HDL 50, LDL 65, LDL particle # 589, LP-IR score 31, Serum glucose 104, creatinine 1.46, eGFR 55, potassium 4.6, CMP otherwise normal 03/13/2014: total cholesterol 177, triglycerides 100, HDL 61, LDL 113, CBC normal, potassium 3.4, creatinine 1.20, eGFR 70, CMP otherwise normal Coronary Angiogram 03/12/2014 Promus 3.5 x 12 DES to prox LAD and Promus 3.5 x32 to mid LAD, 50-60% stenosis mid circumflex, right non-dominant, EF 40% Vella Redhead, MD-[facility]) Echocardiogram 01/04/2015 Left ventricle cavity is normal in size. Moderate concentric hypertrophy of the left ventricle. Doppler evidence of grade I (impaired) diastolic dysfunction. Left ventricle regional wall motion findings: Mid anteroseptal, Mid anterior, Apical anterior, Apical lateral, Apical inferior, Apical septal and Apical cap akinesis. Moderate LV systolic dysfunction. Visual EF is 40%. Mild mitral regurgitation. Mild tricuspid regurgitation. No evidence of pulmonary hypertension. Nuclear stress test 01/20/2015 1. The resting electrocardiogram demonstrated normal sinus rhythm, normal resting conduction and no resting arrhythmias. Anterior infarct, old. Stress EKG shows PVC, ventricular couplets. No ST-T changes of ischemia. The patient performed treadmill exercise using a Bruce protocol, completing 3:48 minutes. The patient completed an estimated workload of 5.59 METS, 86% of the maximum predicted heart rate. The stress test was terminated because of fatigue and leg claudication. 2.  Perfusion imaging study demonstrates the left ventricle to be slightly dilated and stress images compared to rest images. There is a moderate sized  mid to distal anterior anteroapical and anterolateral decreased uptake suggestive of which are of scar moderate to severe ischemia especially in the anterolateral wall. Apex appears to be scarred. There is also a small sized inferior wall scar without ischemia extending from the mid ventricle towards the apex. Left ventricular systolic function calculated by QGS was 41% with anterolateral hypokinesis and apical and apical inferior akinesis. This represents a high risk study. Consider further cardiac workup. Lower Extremity Dopplers 02/09/2015 Significant velocity increase at the right mid superficial femoral artery suggesting >50% stenosis. Diffuse disease in left leg. This exam reveals moderately decreased perfusion of the lower extremity bilaterally, RABI 0.73 and Left at 0.53.  Other Problems (April Rinaldo Ratel; 06/08/2015 11:34 AM) Unspecified Diagnosis     Review of Systems Pamella Pert MD; 06/08/2015 12:44 PM) General Present- Feeling well. Not Present- Anorexia, Fatigue and Fever. Respiratory Not Present- Cough, Decreased Exercise Tolerance and Dyspnea. Cardiovascular Present- Claudications (improved left leg and still present right calf.). Not Present- Chest Pain, Edema, Orthopnea, Paroxysmal Nocturnal Dyspnea and Shortness of Breath. Gastrointestinal Not Present- Change in Bowel Habits, Constipation and Nausea. Neurological Not Present- Focal Neurological Symptoms. Endocrine Not Present- Appetite Changes, Cold Intolerance and Heat Intolerance. Hematology Not Present- Anemia, Petechiae and Prolonged Bleeding.  Vitals ([name]; 06/08/2015 11:44 AM) 06/08/2015 11:35 AM Weight: 177.56 lb   Height: 69 in  Body Surface Area: 1.96 m   Body Mass Index: 26.22 kg/m   Pulse: 47 (Regular)    P.OX: 99% (Room air) BP: 177/81 (Sitting, Left Arm,  Standard)       Physical Exam Pamella Pert MD; 06/08/2015 12:45 PM) General Mental Status - Alert. General Appearance - Cooperative, Appears stated age, Not in acute distress. Orientation - Oriented X3. Build & Nutrition - Well built and Well nourished.  Head and Neck Thyroid Gland Characteristics - no palpable nodules, no palpable enlargement.  Chest and Lung Exam Palpation Tender - No chest wall tenderness. Auscultation Adventitious sounds - Expiratory wheeze - Left Lung Field.  Cardiovascular Inspection Jugular vein - Right - No Distention. Auscultation Heart Sounds - S1 WNL, S2 WNL and No gallop present. Murmurs & Other Heart Sounds - Murmur - No murmur.  Abdomen Palpation/Percussion Normal exam - Non Tender and No hepatosplenomegaly. Auscultation Normal exam - Bowel sounds normal.  Peripheral Vascular Lower Extremity Inspection - Left - No Pigmentation, No Varicose veins. Right - No Pigmentation, No Varicose veins. Palpation - Edema - Bilateral - No edema. Femoral pulse - Left - Normal. Right - 2+ and Bruit. Popliteal pulse - Left - 2+. Right - Feeble. Dorsalis pedis pulse - Left - Normal. Right - 1+. Posterior tibial pulse - Bilateral - 0+. Carotid arteries - Bilateral - No Carotid bruit. Abdomen - No prominent abdominal aortic pulsation, No epigastric bruit. Note:  Right radial access site healed well, asymptomatic   Neurologic Motor - Grossly intact without any focal deficits.  Musculoskeletal Global Assessment Left Lower Extremity - normal range of motion without pain. Right Lower Extremity - normal range of motion without pain.   Assessment & Plan Pamella Pert MD; 06/08/2015 12:48 PM)  PAD (peripheral artery disease) (I73.9) Story: Lower extremity arterial duplex 02/09/2015: Significant velocity increase at the right mid superficial femoral artery suggesting >50% stenosis. Diffuse disease in left leg. This exam reveals moderately decreased  perfusion of the lower extremity bilaterally, RABI 0.73 and Left at 0.53.  Peripheral arteriogram 04/25/2015: Left SFA tandem 95%, Popliteal tandem 99%, PT proximal 99% (single vessel  R/O). Right SFA focal 95%.  Angioplasty 05/30/15: 1.5 mm solid crown CSI atherectomy left SFA, popliteal artery lesions and the anterior tibial vessel followed by Successful PTA and atherectomy followed by drug coated balloon angioplasty to the mid left SFA stenosis of 95% reduced to 0%. Stenting wof popliteal Artery with a 5.0 x 100 mm Supera self-expanding stent to the proximal to mid popliteal artery 95% to 0%   Essential hypertension, benign (I10) Current Plans Started AmLODIPine Besylate '10MG'$ , 1 (one) Tablet daily, #30, 06/08/2015, Ref. x1.  Hyperlipidemia, group A (E78.00)   Current Plans Mechanism of underlying disease process and action of medications discussed with the patient. I discussed primary/secondary prevention and also dietary counseling was done. I have discussed the findings of the peripheral arteriogram with the daughter, patient now has excellent DP pulse on the left leg after angioplasty. He has also noticed improvement in symptoms of claudication with walking. Due to symptomatic claudication involving his right leg and a focal stenosis in his right SFA, he has now been scheduled for right SFA atherectomy/angioplasty.  I have again discussed with him regarding the risks associated with angioplasty, indications as it is claudication and not limb threatening ischemia, risks associated with contrast nephropathy specifically discussed. He is advised to continue to hold valsartan HCT for now, I'll repeat CMP along with lipid profile prior to his procedure, unless the serum creatinine is not stable we will proceed with planned angioplasty to his right SFA. I have added amlodipine 10 mg by mouth daily for his elevated blood pressure. He'll need ABI was procedure to establish a baseline.  CC Dr. Juanita Craver.  Addendum Note [name] AGNP-C; 06/22/2015 9:13 AM) 06/21/2015: HB 11.9/HCT 34.6 with normocytic indices, PT/INR normal, Total cholesterol 131, triglycerides 54, HDL 54, LDL 66, serum glucose 116, creatinine 1.13, eGFR 75, potassium 4.6, CMP normal  Labs stable to proceed with angiogram  Signed by Shawn Page, MD (06/08/2015 7:00 PM)

## 2015-06-27 ENCOUNTER — Encounter (HOSPITAL_COMMUNITY)
Admission: RE | Disposition: A | Discharge status: Home / Self Care | Payer: Self-pay | Source: Ambulatory Visit | Attending: Cardiology

## 2015-06-27 ENCOUNTER — Ambulatory Visit (HOSPITAL_COMMUNITY)
Admission: RE | Admit: 2015-06-27 | Discharge: 2015-06-27 | Disposition: A | Discharge status: Home / Self Care | Payer: PPO | Source: Ambulatory Visit | Attending: Cardiology | Admitting: Cardiology

## 2015-06-27 DIAGNOSIS — I739 Peripheral vascular disease, unspecified: Secondary | ICD-10-CM | POA: Diagnosis present

## 2015-06-27 DIAGNOSIS — Z87891 Personal history of nicotine dependence: Secondary | ICD-10-CM | POA: Insufficient documentation

## 2015-06-27 DIAGNOSIS — I70211 Atherosclerosis of native arteries of extremities with intermittent claudication, right leg: Secondary | ICD-10-CM | POA: Insufficient documentation

## 2015-06-27 DIAGNOSIS — E785 Hyperlipidemia, unspecified: Secondary | ICD-10-CM | POA: Insufficient documentation

## 2015-06-27 DIAGNOSIS — N189 Chronic kidney disease, unspecified: Secondary | ICD-10-CM | POA: Diagnosis not present

## 2015-06-27 DIAGNOSIS — I2510 Atherosclerotic heart disease of native coronary artery without angina pectoris: Secondary | ICD-10-CM | POA: Insufficient documentation

## 2015-06-27 DIAGNOSIS — K219 Gastro-esophageal reflux disease without esophagitis: Secondary | ICD-10-CM | POA: Insufficient documentation

## 2015-06-27 DIAGNOSIS — Z8249 Family history of ischemic heart disease and other diseases of the circulatory system: Secondary | ICD-10-CM | POA: Insufficient documentation

## 2015-06-27 DIAGNOSIS — I252 Old myocardial infarction: Secondary | ICD-10-CM | POA: Insufficient documentation

## 2015-06-27 DIAGNOSIS — Z7982 Long term (current) use of aspirin: Secondary | ICD-10-CM | POA: Diagnosis not present

## 2015-06-27 DIAGNOSIS — Z955 Presence of coronary angioplasty implant and graft: Secondary | ICD-10-CM | POA: Insufficient documentation

## 2015-06-27 DIAGNOSIS — I129 Hypertensive chronic kidney disease with stage 1 through stage 4 chronic kidney disease, or unspecified chronic kidney disease: Secondary | ICD-10-CM | POA: Diagnosis not present

## 2015-06-27 DIAGNOSIS — I255 Ischemic cardiomyopathy: Secondary | ICD-10-CM | POA: Diagnosis not present

## 2015-06-27 LAB — POCT ACTIVATED CLOTTING TIME
ACTIVATED CLOTTING TIME: 219 s
ACTIVATED CLOTTING TIME: 198 s
ACTIVATED CLOTTING TIME: 183 s
Activated Clotting Time: 255 seconds

## 2015-06-27 SURGERY — ATHERECTOMY PERIPHERAL ARTERY
Laterality: Right

## 2015-06-27 MED ORDER — HEPARIN (PORCINE) IN NACL 2-0.9 UNIT/ML-% IJ SOLN
INTRAMUSCULAR | Status: AC
  Filled 2015-06-27: qty 1000

## 2015-06-27 MED ORDER — SODIUM CHLORIDE 0.9 % IV SOLN
1.0000 mL/kg/h | INTRAVENOUS | Status: DC
  Administered 2015-06-27: 1 mL/kg/h via INTRAVENOUS

## 2015-06-27 MED ORDER — FENTANYL CITRATE (PF) 100 MCG/2ML IJ SOLN
INTRAMUSCULAR | Status: DC | PRN
  Administered 2015-06-27: 25 ug via INTRAVENOUS

## 2015-06-27 MED ORDER — HEPARIN SODIUM (PORCINE) 1000 UNIT/ML IJ SOLN
INTRAMUSCULAR | Status: DC | PRN
  Administered 2015-06-27: 1500 [IU] via INTRAVENOUS
  Administered 2015-06-27: 7000 [IU] via INTRAVENOUS

## 2015-06-27 MED ORDER — LIDOCAINE HCL (PF) 1 % IJ SOLN
INTRAMUSCULAR | Status: AC
  Filled 2015-06-27: qty 30

## 2015-06-27 MED ORDER — LIDOCAINE HCL (PF) 1 % IJ SOLN
INTRAMUSCULAR | Status: DC | PRN
  Administered 2015-06-27: 30 mL via SUBCUTANEOUS

## 2015-06-27 MED ORDER — MIDAZOLAM HCL 2 MG/2ML IJ SOLN
INTRAMUSCULAR | Status: AC
  Filled 2015-06-27: qty 2

## 2015-06-27 MED ORDER — SODIUM CHLORIDE 0.9% FLUSH: 3.0000 mL | INTRAVENOUS | Status: DC | PRN

## 2015-06-27 MED ORDER — SODIUM CHLORIDE 0.9 % IV BOLUS (SEPSIS)
500.0000 mL | Freq: Once | INTRAVENOUS | Status: AC
  Administered 2015-06-27: 500 mL via INTRAVENOUS

## 2015-06-27 MED ORDER — NITROGLYCERIN 1 MG/10 ML FOR IR/CATH LAB
INTRA_ARTERIAL | Status: AC
  Filled 2015-06-27: qty 10

## 2015-06-27 MED ORDER — MIDAZOLAM HCL 2 MG/2ML IJ SOLN
INTRAMUSCULAR | Status: DC | PRN
  Administered 2015-06-27: 1 mg via INTRAVENOUS

## 2015-06-27 MED ORDER — NITROGLYCERIN 1 MG/10 ML FOR IR/CATH LAB
INTRA_ARTERIAL | Status: DC | PRN
  Administered 2015-06-27: 500 ug via INTRA_ARTERIAL
  Administered 2015-06-27: 400 ug via INTRA_ARTERIAL

## 2015-06-27 MED ORDER — SODIUM CHLORIDE 0.9 % IV SOLN: 250.0000 mL | INTRAVENOUS | Status: DC | PRN

## 2015-06-27 MED ORDER — SODIUM CHLORIDE 0.9% FLUSH: 3.0000 mL | Freq: Two times a day (BID) | INTRAVENOUS | Status: DC

## 2015-06-27 MED ORDER — HEPARIN SODIUM (PORCINE) 1000 UNIT/ML IJ SOLN
INTRAMUSCULAR | Status: AC
  Filled 2015-06-27: qty 1

## 2015-06-27 MED ORDER — HEPARIN (PORCINE) IN NACL 2-0.9 UNIT/ML-% IJ SOLN
INTRAMUSCULAR | Status: DC | PRN
  Administered 2015-06-27: 1000 mL

## 2015-06-27 MED ORDER — FENTANYL CITRATE (PF) 100 MCG/2ML IJ SOLN
INTRAMUSCULAR | Status: AC
  Filled 2015-06-27: qty 2

## 2015-06-27 MED ORDER — IODIXANOL 320 MG/ML IV SOLN
INTRAVENOUS | Status: DC | PRN
  Administered 2015-06-27: 50 mL via INTRA_ARTERIAL

## 2015-06-27 MED ORDER — SODIUM CHLORIDE 0.9 % IV SOLN: INTRAVENOUS | Status: DC

## 2015-06-27 SURGICAL SUPPLY — 17 items
BALLN LUTONIX 6X150X130 (BALLOONS) ×4
BALLOON LUTONIX 6X150X130 (BALLOONS) ×2 IMPLANT
CATH CROSS OVER TEMPO 5F (CATHETERS) ×4 IMPLANT
CATH TURBOHAWK STAND LS-C (CATHETERS) ×4 IMPLANT
DEVICE SPIDERFX EMB PROT 6MM (WIRE) ×4 IMPLANT
KIT ENCORE 26 ADVANTAGE (KITS) ×4 IMPLANT
KIT MICROINTRODUCER STIFF 5F (SHEATH) ×4 IMPLANT
KIT PV (KITS) ×4 IMPLANT
PACK CARDIAC CATHETERIZATION (CUSTOM PROCEDURE TRAY) ×4 IMPLANT
SHEATH HIGHFLEX ANSEL 7FR 55CM (SHEATH) ×4 IMPLANT
SHEATH PINNACLE 5F 10CM (SHEATH) ×4 IMPLANT
SHEATH PINNACLE 7F 10CM (SHEATH) ×4 IMPLANT
TAPE RADIOPAQUE TURBO (MISCELLANEOUS) ×4 IMPLANT
TRANSDUCER W/STOPCOCK (MISCELLANEOUS) ×4 IMPLANT
TUBING CIL FLEX 10 FLL-RA (TUBING) ×4 IMPLANT
WIRE HITORQ VERSACORE ST 145CM (WIRE) ×4 IMPLANT
WIRE VIPER ADVANCE .017X335CM (WIRE) ×4 IMPLANT

## 2015-06-27 NOTE — Interval H&P Note (Signed)
History and Physical Interval Note:  06/27/2015 6:22 AM  Shawn Schmidt  has presented today for surgery, with the diagnosis of c/p  The various methods of treatment have been discussed with the patient and family. After consideration of risks, benefits and other options for treatment, the patient has consented to  Procedure(s): Lower Extremity Angiography (N/A) and possible PTA as a surgical intervention .  The patient's history has been reviewed, patient examined, no change in status, stable for surgery.  I have reviewed the patient's chart and labs.  Questions were answered to the patient's satisfaction.     [name]

## 2015-06-27 NOTE — Discharge Instructions (Signed)

## 2015-06-27 NOTE — Progress Notes (Addendum)
Site area: lt groin sheath pulled at 1133 Site Prior to Removal:  Level 0 Pressure Applied For:  40 Manual:   yes Patient Status During Pull:  stable Post Pull Site:  Level   Post Pull Instructions Given and understood and was able to voice back instructions Post Pull Pulses Present:  Dressing Applied:   Bedrest begins @ [id number] Comments:X4hrs  No oozing or hematoma.

## 2015-07-05 DIAGNOSIS — E7800 Pure hypercholesterolemia, unspecified: Secondary | ICD-10-CM | POA: Diagnosis not present

## 2015-07-05 DIAGNOSIS — I739 Peripheral vascular disease, unspecified: Secondary | ICD-10-CM | POA: Diagnosis not present

## 2015-07-05 DIAGNOSIS — I10 Essential (primary) hypertension: Secondary | ICD-10-CM | POA: Diagnosis not present

## 2015-09-28 DIAGNOSIS — I739 Peripheral vascular disease, unspecified: Secondary | ICD-10-CM | POA: Diagnosis not present

## 2015-10-11 DIAGNOSIS — I2510 Atherosclerotic heart disease of native coronary artery without angina pectoris: Secondary | ICD-10-CM | POA: Diagnosis not present

## 2015-10-11 DIAGNOSIS — I10 Essential (primary) hypertension: Secondary | ICD-10-CM | POA: Diagnosis not present

## 2015-10-11 DIAGNOSIS — E7800 Pure hypercholesterolemia, unspecified: Secondary | ICD-10-CM | POA: Diagnosis not present

## 2015-10-11 DIAGNOSIS — I739 Peripheral vascular disease, unspecified: Secondary | ICD-10-CM | POA: Diagnosis not present

## 2016-04-03 DIAGNOSIS — I10 Essential (primary) hypertension: Secondary | ICD-10-CM | POA: Diagnosis not present

## 2016-04-03 DIAGNOSIS — E7800 Pure hypercholesterolemia, unspecified: Secondary | ICD-10-CM | POA: Diagnosis not present

## 2016-04-04 DIAGNOSIS — I739 Peripheral vascular disease, unspecified: Secondary | ICD-10-CM | POA: Diagnosis not present

## 2016-04-18 DIAGNOSIS — E7800 Pure hypercholesterolemia, unspecified: Secondary | ICD-10-CM | POA: Diagnosis not present

## 2016-04-18 DIAGNOSIS — I10 Essential (primary) hypertension: Secondary | ICD-10-CM | POA: Diagnosis not present

## 2016-04-18 DIAGNOSIS — I2510 Atherosclerotic heart disease of native coronary artery without angina pectoris: Secondary | ICD-10-CM | POA: Diagnosis not present

## 2016-04-18 DIAGNOSIS — I739 Peripheral vascular disease, unspecified: Secondary | ICD-10-CM | POA: Diagnosis not present

## 2016-04-26 DIAGNOSIS — I2510 Atherosclerotic heart disease of native coronary artery without angina pectoris: Secondary | ICD-10-CM | POA: Diagnosis not present

## 2016-04-26 DIAGNOSIS — I10 Essential (primary) hypertension: Secondary | ICD-10-CM | POA: Diagnosis not present

## 2016-10-07 DIAGNOSIS — E7800 Pure hypercholesterolemia, unspecified: Secondary | ICD-10-CM | POA: Diagnosis not present

## 2016-10-07 DIAGNOSIS — I2510 Atherosclerotic heart disease of native coronary artery without angina pectoris: Secondary | ICD-10-CM | POA: Diagnosis not present

## 2016-11-14 DIAGNOSIS — R0989 Other specified symptoms and signs involving the circulatory and respiratory systems: Secondary | ICD-10-CM | POA: Diagnosis not present

## 2017-04-24 DIAGNOSIS — I2510 Atherosclerotic heart disease of native coronary artery without angina pectoris: Secondary | ICD-10-CM | POA: Diagnosis not present

## 2017-04-24 DIAGNOSIS — I739 Peripheral vascular disease, unspecified: Secondary | ICD-10-CM | POA: Diagnosis not present

## 2017-04-24 DIAGNOSIS — I10 Essential (primary) hypertension: Secondary | ICD-10-CM | POA: Diagnosis not present

## 2017-04-24 DIAGNOSIS — R001 Bradycardia, unspecified: Secondary | ICD-10-CM | POA: Diagnosis not present

## 2017-10-21 DIAGNOSIS — R001 Bradycardia, unspecified: Secondary | ICD-10-CM | POA: Diagnosis not present

## 2017-10-21 DIAGNOSIS — I2510 Atherosclerotic heart disease of native coronary artery without angina pectoris: Secondary | ICD-10-CM | POA: Diagnosis not present

## 2017-10-21 DIAGNOSIS — I739 Peripheral vascular disease, unspecified: Secondary | ICD-10-CM | POA: Diagnosis not present

## 2017-10-21 DIAGNOSIS — I10 Essential (primary) hypertension: Secondary | ICD-10-CM | POA: Diagnosis not present

## 2017-11-18 DIAGNOSIS — E7800 Pure hypercholesterolemia, unspecified: Secondary | ICD-10-CM | POA: Diagnosis not present

## 2017-11-18 DIAGNOSIS — I2510 Atherosclerotic heart disease of native coronary artery without angina pectoris: Secondary | ICD-10-CM | POA: Diagnosis not present

## 2017-11-18 DIAGNOSIS — I6523 Occlusion and stenosis of bilateral carotid arteries: Secondary | ICD-10-CM | POA: Diagnosis not present

## 2018-02-12 DIAGNOSIS — Z23 Encounter for immunization: Secondary | ICD-10-CM | POA: Diagnosis not present

## 2018-02-12 DIAGNOSIS — D649 Anemia, unspecified: Secondary | ICD-10-CM | POA: Diagnosis not present

## 2018-02-25 DIAGNOSIS — D649 Anemia, unspecified: Secondary | ICD-10-CM | POA: Diagnosis not present

## 2018-03-02 DIAGNOSIS — D649 Anemia, unspecified: Secondary | ICD-10-CM | POA: Diagnosis not present

## 2018-04-23 DIAGNOSIS — I2510 Atherosclerotic heart disease of native coronary artery without angina pectoris: Secondary | ICD-10-CM | POA: Diagnosis not present

## 2018-04-23 DIAGNOSIS — R001 Bradycardia, unspecified: Secondary | ICD-10-CM | POA: Diagnosis not present

## 2018-04-23 DIAGNOSIS — I739 Peripheral vascular disease, unspecified: Secondary | ICD-10-CM | POA: Diagnosis not present

## 2018-04-23 DIAGNOSIS — I5022 Chronic systolic (congestive) heart failure: Secondary | ICD-10-CM | POA: Diagnosis not present

## 2018-05-04 DIAGNOSIS — R739 Hyperglycemia, unspecified: Secondary | ICD-10-CM | POA: Diagnosis not present

## 2018-05-04 DIAGNOSIS — I5022 Chronic systolic (congestive) heart failure: Secondary | ICD-10-CM | POA: Diagnosis not present

## 2018-05-05 DIAGNOSIS — I5022 Chronic systolic (congestive) heart failure: Secondary | ICD-10-CM | POA: Diagnosis not present

## 2018-05-26 ENCOUNTER — Telehealth: Payer: Self-pay

## 2018-05-26 NOTE — Telephone Encounter (Signed)
Pt called letting AK know he has finished his Valsartan and she started him on Entresto 24/26 with a 2 week follow up

## 2018-06-09 ENCOUNTER — Ambulatory Visit: Payer: PPO | Admitting: Cardiology

## 2018-06-09 ENCOUNTER — Encounter: Payer: Self-pay | Admitting: Cardiology

## 2018-06-09 ENCOUNTER — Ambulatory Visit: Payer: Self-pay | Admitting: Cardiology

## 2018-06-09 VITALS — BP 110/59 | HR 60 | Ht 69.0 in | Wt 166.0 lb

## 2018-06-09 DIAGNOSIS — I739 Peripheral vascular disease, unspecified: Secondary | ICD-10-CM | POA: Diagnosis not present

## 2018-06-09 DIAGNOSIS — I5042 Chronic combined systolic (congestive) and diastolic (congestive) heart failure: Secondary | ICD-10-CM | POA: Diagnosis not present

## 2018-06-09 DIAGNOSIS — I2510 Atherosclerotic heart disease of native coronary artery without angina pectoris: Secondary | ICD-10-CM | POA: Diagnosis not present

## 2018-06-09 DIAGNOSIS — Z720 Tobacco use: Secondary | ICD-10-CM | POA: Diagnosis not present

## 2018-06-09 NOTE — Progress Notes (Signed)
Subjective:   Shawn Schmidt, male    DOB: [birth year], 78 y.o.   MRN: [id number]  Dewayne Shorter, PA-C:  Chief Complaint  Patient presents with  . Congestive Heart Failure    4 week f/u     HPI: Shawn Schmidt  is a 78 y.o. male  with CAD with presentation with ST elevation MI at [facility] on November 28th, 2015 with symptoms of fatigue and heaviness in his chest. underwent PCI to the mid and proximal LAD by placement of 2 overlapping mid LAD stents. He has history of hypertension, hyperlipidemia, PAD with successful complex angioplasty to distal left SFA, popliteal and left anterior tibial artery followed by stenting of the popliteal artery in 2017. He also has history of right SFA atherectomy me followed by drug coated balloon angioplasty on 06/27/2015. Also has chronic renal insufficiency and carotid artery stenosis that is been stable and is asymptomatic by carotid duplex in August 2019.  Patient was recently seen for six-month follow-up.  Echocardiogram was ordered for surveillance of his LVEF.  Echocardiogram was performed on 05/05/2018 that was unchanged from previous echocardiogram in 2016.  LVEF remains 35 to 40%.  He was started on Entresto in view of his continued depressed LVEF and also class II NYHA CHF symptoms.  He now presents for Entresto titration.  Claudication symptoms have remained stable.  DAPT was stopped at last office visit. He has been found to have anemia that is being followed by PCP and is scheduled for follow up in March.  He denies any symptoms of angina.  No leg edema, PND, or orthopnea.      History reviewed. No pertinent past medical history.  Past Surgical History:  Procedure Laterality Date  . CARDIAC CATHETERIZATION N/A 02/21/2015   Procedure: Left Heart Cath and Coronary Angiography;  Surgeon: [name], MD;  Location: [place] [facility];  Service: Cardiovascular;  Laterality: N/A;  . PERIPHERAL VASCULAR CATHETERIZATION Bilateral  04/25/2015   Procedure: Lower Extremity Angiography;  Surgeon: [name], MD;  Location: [place] [facility];  Service: Cardiovascular;  Laterality: Bilateral;  . PERIPHERAL VASCULAR CATHETERIZATION Left 05/30/2015   Procedure: Peripheral Vascular Intervention;  Surgeon: [name], MD;  Location: [place] [facility];  Service: Cardiovascular;  Laterality: Left;  POPLITEAL  . PERIPHERAL VASCULAR CATHETERIZATION Left 05/30/2015   Procedure: Peripheral Vascular Intervention;  Surgeon: [name], MD;  Location: [place] [facility];  Service: Cardiovascular;  Laterality: Left;  POPLITEAL    Family History  Problem Relation Age of Onset  . Hypertension Mother   . Hypertension Father     Social History   Socioeconomic History  . Marital status: Widowed    Spouse name: Not on file  . Number of children: 2  . Years of education: Not on file  . Highest education level: Not on file  Occupational History  . Not on file  Social Needs  . Financial resource strain: Not on file  . Food insecurity:    Worry: Not on file    Inability: Not on file  . Transportation needs:    Medical: Not on file    Non-medical: Not on file  Tobacco Use  . Smoking status: Former Smoker    Types: Cigarettes    Last attempt to quit: 2016    Years since quitting: 4.1  . Smokeless tobacco: Never Used  Substance and Sexual Activity  . Alcohol use: Not on file    Comment: occ  .  Drug use: Never  . Sexual activity: Not on file  Lifestyle  . Physical activity:    Days per week: Not on file    Minutes per session: Not on file  . Stress: Not on file  Relationships  . Social connections:    Talks on phone: Not on file    Gets together: Not on file    Attends religious service: Not on file    Active member of club or organization: Not on file    Attends meetings of clubs or organizations: Not on file    Relationship status: Not on file  . Intimate partner violence:    Fear of current or ex partner: Not on file     Emotionally abused: Not on file    Physically abused: Not on file    Forced sexual activity: Not on file  Other Topics Concern  . Not on file  Social History Narrative  . Not on file    Current Meds  Medication Sig  . amLODipine (NORVASC) 10 MG tablet Take 10 mg by mouth daily.  Marland Kitchen aspirin EC 81 MG tablet Take 1 tablet by mouth daily.  . carvedilol (COREG) 3.125 MG tablet Take by mouth 2 (two) times daily.  . cyanocobalamin 1000 MCG tablet Take 1,000 mcg by mouth daily.  . ferrous sulfate 325 (65 FE) MG tablet Take 325 mg by mouth daily with breakfast.  . nitroGLYCERIN (NITROSTAT) 0.4 MG SL tablet Place under the tongue.  . rosuvastatin (CRESTOR) 20 MG tablet Take 20 mg by mouth daily.  . [DISCONTINUED] rosuvastatin (CRESTOR) 20 MG tablet Take by mouth.  . [DISCONTINUED] sacubitril-valsartan (ENTRESTO) 24-26 MG Take 1 tablet by mouth 2 (two) times daily.  . [DISCONTINUED] sacubitril-valsartan (ENTRESTO) 24-26 MG Take 1 tablet by mouth 2 (two) times daily.     Review of Systems  Constitution: Negative for decreased appetite, malaise/fatigue, weight gain and weight loss.  Eyes: Negative for visual disturbance.  Cardiovascular: Positive for claudication (stable; improved) and dyspnea on exertion (mild). Negative for chest pain, leg swelling, orthopnea, palpitations and syncope.  Respiratory: Negative for hemoptysis and wheezing.   Endocrine: Negative for cold intolerance and heat intolerance.  Hematologic/Lymphatic: Does not bruise/bleed easily.  Skin: Negative for nail changes.  Musculoskeletal: Negative for muscle weakness and myalgias.  Gastrointestinal: Negative for abdominal pain, change in bowel habit, nausea and vomiting.  Neurological: Negative for difficulty with concentration, dizziness, focal weakness and headaches.  Psychiatric/Behavioral: Negative for altered mental status and suicidal ideas.  All other systems reviewed and are negative.      Objective:     Blood  pressure (!) 110/59, pulse 60, height 5\' 9"  (1.753 m), weight 166 lb (75.3 kg), SpO2 99 %.  Echocardiogram 05/05/2018: Left ventricle cavity is normal in size. Mild concentric hypertrophy of the left ventricle. Moderate decrease in global wall motion. Mid to distal anteroseptal/anterior hypokinesis. Apical akinesis. Visual EF 35-40% Doppler evidence of grade I (impaired) diastolic dysfunction, normal LAP. Mild to moderate tricuspid regurgitation. Estimated pulmonary artery systolic pressure 23 mmHg. No significant change compared to previous study in 2016.  Carotid artery duplex 11/18/2017: Stenosis in the right common carotid artery (<50%) with homogeneous plaque. Stenosis in the left internal carotid artery (16-49%). Antegrade right vertebral artery flow. Antegrade left vertebral artery flow. Compared to the study done on 11/14/2016, no significant change. Follow up in one year is appropriate if clinically indicated.  Coronary Angiogram in [place] Acute anterior MI: Stenting with 3.5 x 12 mm  proximal and 3.5 x 32 mm promos Premier DES in the mid LAD. 50-60% mid circumflex stenosis, nondominant right with a mid 85% stenosis.  Coronary Angiogram 02/21/2015 1. Widely patent mid LAD stent, 3.5 x 12 and 3.5 x 32 mm Promus placed on 03/12/2014 for acute MI, type III LAD which supplies essentially the entire anterolateral wall and inferior wall. 2. Moderate stenosis in the mid circumflex, 50%. Nondominant RCA. 3. Ischemic cardiomyopathy, LVEF 30-35% with mid to distal anterior anteroapical, apical, apical inferior, mid inferior severe hypokinesis to akinesis. 4. Limited abdominal aortogram with bifemoral runoff reveals widely patent iliac vessels, profunda femoral vessel, moderately diseased bilateral internal iliac, severely diseased bilateral profunda femoral artery.  Peripheral arteriogram 06/27/2015: Right SFA atherectomy with Turbo Hawk followed by 6x150 mm Lutonix (DCB) angioplasty. 90%  to 0%.  Left leg angioplasty 05/30/15: 1.5 mm solid crown CSI atherectomy left SFA, popliteal artery lesions and anterior tibial vessel & drug coated balloon angioplasty to the mid left SFA 95% to 0%. Stenting of proximal and mid popliteal Artery 5.0 x 100 mm Supera self-expanding stent.  Lower extremity arterial duplex 04/04/2016: No hemodynamically significant stenoses are identified in the lower extremity arterial system. There is diffuse soft plaque in the bilateral SFA. This exam reveals mildly decreased perfusion of the right lower extremity, with RABI 0.87 and moderately decreased perfusion of the left lower extremity, with LABI 0.65 noted at the dorsalis pedis artery level with biphasic waveform. The bilateral SFA angioplasty site & left popliteal artery stent is patent. Compared to the study done on 02/09/2015, ABI improved bilaterally from right 0.73 and left 0.53.  Physical Exam  Constitutional: He is oriented to person, place, and time. Vital signs are normal. He appears well-developed and well-nourished.  HENT:  Head: Normocephalic and atraumatic.  Neck: Normal range of motion.  Cardiovascular: Regular rhythm, normal heart sounds and intact distal pulses. Bradycardia present.  Pulses:      Carotid pulses are on the left side with bruit.      Femoral pulses are 2+ on the right side with bruit and 2+ on the left side with bruit.      Popliteal pulses are 1+ on the right side and 2+ on the left side.       Dorsalis pedis pulses are 2+ on the right side and 2+ on the left side.       Posterior tibial pulses are 0 on the right side and 0 on the left side.  Pulmonary/Chest: Effort normal and breath sounds normal. No accessory muscle usage. No respiratory distress.  Abdominal: Soft. Bowel sounds are normal.  Musculoskeletal: Normal range of motion.  Neurological: He is alert and oriented to person, place, and time.  Skin: Skin is warm and dry.  Vitals reviewed.        Assessment &  Recommendations:   1. Chronic heart failure with reduced ejection fraction and diastolic dysfunction (HCC) LVEF continues to be depressed at 35 to 40%, now on Entresto.  He is tolerating this well; however, he was found to only be taking 1 tablet/day.  He will finish his current bottle of samples with taking twice a day dose and if he continues to tolerate that, he will increase to moderate dose.  Samples were provided today. Kidney function has remained stable.  Has class II NYHA symptoms.  No clinical evidence of decompensated heart failure.  He is on low-dose carvedilol in view of sinus bradycardia.  2. Atherosclerosis of native coronary artery of native  heart without angina pectoris He is without symptoms of angina.  On appropriate medical therapy.  Plavix was discontinued at his last office visit.  3.Current tobacco user He does occasionally smoke cigarettes.  Stressed the importance of complete smoking cessation.  4. Claudication in peripheral vascular disease ([place]) Claudication symptoms have remained stable since angioplasty.  No evidence of limb ischemia.  Vascular exam is unchanged.  I will see him back in 2 weeks for evaluation of Entresto and possible up titration.    Jeri Lager, FNP-C [facility], [place] Office: [phone] Fax: [phone]

## 2018-06-13 DIAGNOSIS — Z720 Tobacco use: Secondary | ICD-10-CM | POA: Insufficient documentation

## 2018-06-13 DIAGNOSIS — I6523 Occlusion and stenosis of bilateral carotid arteries: Secondary | ICD-10-CM | POA: Insufficient documentation

## 2018-06-13 DIAGNOSIS — I5042 Chronic combined systolic (congestive) and diastolic (congestive) heart failure: Secondary | ICD-10-CM | POA: Insufficient documentation

## 2018-06-13 MED ORDER — SACUBITRIL-VALSARTAN 49-51 MG PO TABS: 1.0000 | ORAL_TABLET | Freq: Two times a day (BID) | ORAL | 0 refills | Status: DC

## 2018-06-16 ENCOUNTER — Telehealth: Payer: Self-pay | Admitting: Cardiology

## 2018-06-17 NOTE — Telephone Encounter (Signed)
Erroneous encounter

## 2018-06-29 NOTE — Progress Notes (Signed)
Subjective:   Shawn Schmidt, male    DOB: Nov 04, 1940, 78 y.o.   MRN: [id number]  [name], MD:  Chief Complaint  Patient presents with  . Follow-up    3 wk Entresto    HPI: Shawn Schmidt  is a 78 y.o. male  with CAD with presentation with ST elevation MI at [facility] on November 28th, 2015 with symptoms of fatigue and heaviness in his chest. underwent PCI to the mid and proximal LAD by placement of 2 overlapping mid LAD stents. He has history of hypertension, hyperlipidemia, PAD with successful complex angioplasty to distal left SFA, popliteal and left anterior tibial artery followed by stenting of the popliteal artery in 2017. He also has history of right SFA atherectomy me followed by drug coated balloon angioplasty on 06/27/2015. Also has chronic renal insufficiency and carotid artery stenosis that is been stable and is asymptomatic by carotid duplex in August 2019.  Echocardiogram was performed on 05/05/2018 that was unchanged from previous echocardiogram in 2016.  LVEF remains 35 to 40%.  He was started on Entresto in view of his continued depressed LVEF and also class II NYHA CHF symptoms. He is now on moderate dose and presents for follow up.  Tolerating medications well. No complaints today. Seeing Dr. Redmond Pulling next month for follow up.      Past Medical History:  Diagnosis Date  . Hyperlipidemia   . Hypertension     Past Surgical History:  Procedure Laterality Date  . CARDIAC CATHETERIZATION N/A 02/21/2015   Procedure: Left Heart Cath and Coronary Angiography;  Surgeon: [name], MD;  Location: [place] [facility];  Service: Cardiovascular;  Laterality: N/A;  . PERIPHERAL VASCULAR CATHETERIZATION Bilateral 04/25/2015   Procedure: Lower Extremity Angiography;  Surgeon: [name], MD;  Location: [place] [facility];  Service: Cardiovascular;  Laterality: Bilateral;  . PERIPHERAL VASCULAR CATHETERIZATION Left 05/30/2015   Procedure: Peripheral Vascular  Intervention;  Surgeon: [name], MD;  Location: [place] [facility];  Service: Cardiovascular;  Laterality: Left;  POPLITEAL  . PERIPHERAL VASCULAR CATHETERIZATION Left 05/30/2015   Procedure: Peripheral Vascular Intervention;  Surgeon: [name], MD;  Location: [place] [facility];  Service: Cardiovascular;  Laterality: Left;  POPLITEAL    Family History  Problem Relation Age of Onset  . Hypertension Mother   . Hypertension Father     Social History   Socioeconomic History  . Marital status: Widowed    Spouse name: Not on file  . Number of children: 2  . Years of education: Not on file  . Highest education level: Not on file  Occupational History  . Not on file  Social Needs  . Financial resource strain: Not on file  . Food insecurity:    Worry: Not on file    Inability: Not on file  . Transportation needs:    Medical: Not on file    Non-medical: Not on file  Tobacco Use  . Smoking status: Current Every Day Smoker    Packs/day: 1.00    Types: Cigarettes  . Smokeless tobacco: Never Used  Substance and Sexual Activity  . Alcohol use: Yes    Comment: occ  . Drug use: Never  . Sexual activity: Not on file  Lifestyle  . Physical activity:    Days per week: Not on file    Minutes per session: Not on file  . Stress: Not on file  Relationships  . Social connections:  Talks on phone: Not on file    Gets together: Not on file    Attends religious service: Not on file    Active member of club or organization: Not on file    Attends meetings of clubs or organizations: Not on file    Relationship status: Not on file  . Intimate partner violence:    Fear of current or ex partner: Not on file    Emotionally abused: Not on file    Physically abused: Not on file    Forced sexual activity: Not on file  Other Topics Concern  . Not on file  Social History Narrative  . Not on file    Current Meds  Medication Sig  . amLODipine (NORVASC) 10 MG tablet Take 10 mg by mouth  daily.  Marland Kitchen aspirin EC 81 MG tablet Take 1 tablet by mouth daily.  . carvedilol (COREG) 3.125 MG tablet Take by mouth 2 (two) times daily.  . cyanocobalamin 1000 MCG tablet Take 1,000 mcg by mouth daily.  . ferrous sulfate 325 (65 FE) MG tablet Take 325 mg by mouth daily with breakfast.  . nitroGLYCERIN (NITROSTAT) 0.4 MG SL tablet Place under the tongue.  . rosuvastatin (CRESTOR) 20 MG tablet Take 20 mg by mouth daily.  . sacubitril-valsartan (ENTRESTO) 49-51 MG Take 1 tablet by mouth 2 (two) times daily.  . [DISCONTINUED] sacubitril-valsartan (ENTRESTO) 49-51 MG Take 1 tablet by mouth 2 (two) times daily.     Review of Systems  Constitution: Negative for decreased appetite, malaise/fatigue, weight gain and weight loss.  Eyes: Negative for visual disturbance.  Cardiovascular: Positive for claudication (stable; improved) and dyspnea on exertion (mild). Negative for chest pain, leg swelling, orthopnea, palpitations and syncope.  Respiratory: Negative for hemoptysis and wheezing.   Endocrine: Negative for cold intolerance and heat intolerance.  Hematologic/Lymphatic: Does not bruise/bleed easily.  Skin: Negative for nail changes.  Musculoskeletal: Negative for muscle weakness and myalgias.  Gastrointestinal: Negative for abdominal pain, change in bowel habit, nausea and vomiting.  Neurological: Negative for difficulty with concentration, dizziness, focal weakness and headaches.  Psychiatric/Behavioral: Negative for altered mental status and suicidal ideas.  All other systems reviewed and are negative.      Objective:     Blood pressure (!) 113/55, pulse (!) 52, height 5\' 9"  (1.753 m), weight 164 lb (74.4 kg), SpO2 97 %.  Echocardiogram 05/05/2018: Left ventricle cavity is normal in size. Mild concentric hypertrophy of the left ventricle. Moderate decrease in global wall motion. Mid to distal anteroseptal/anterior hypokinesis. Apical akinesis. Visual EF 35-40% Doppler evidence of grade I  (impaired) diastolic dysfunction, normal LAP. Mild to moderate tricuspid regurgitation. Estimated pulmonary artery systolic pressure 23 mmHg. No significant change compared to previous study in 2016.  Carotid artery duplex 11/18/2017: Stenosis in the right common carotid artery (<50%) with homogeneous plaque. Stenosis in the left internal carotid artery (16-49%). Antegrade right vertebral artery flow. Antegrade left vertebral artery flow. Compared to the study done on 11/14/2016, no significant change. Follow up in one year is appropriate if clinically indicated.  Coronary Angiogram in [place] Acute anterior MI: Stenting with 3.5 x 12 mm proximal and 3.5 x 32 mm promos Premier DES in the mid LAD. 50-60% mid circumflex stenosis, nondominant right with a mid 85% stenosis.  Coronary Angiogram 02/21/2015 1. Widely patent mid LAD stent, 3.5 x 12 and 3.5 x 32 mm Promus placed on 03/12/2014 for acute MI, type III LAD which supplies essentially the entire anterolateral wall and  inferior wall. 2. Moderate stenosis in the mid circumflex, 50%. Nondominant RCA. 3. Ischemic cardiomyopathy, LVEF 30-35% with mid to distal anterior anteroapical, apical, apical inferior, mid inferior severe hypokinesis to akinesis. 4. Limited abdominal aortogram with bifemoral runoff reveals widely patent iliac vessels, profunda femoral vessel, moderately diseased bilateral internal iliac, severely diseased bilateral profunda femoral artery.  Peripheral arteriogram 06/27/2015: Right SFA atherectomy with Turbo Hawk followed by 6x150 mm Lutonix (DCB) angioplasty. 90% to 0%.  Left leg angioplasty 05/30/15: 1.5 mm solid crown CSI atherectomy left SFA, popliteal artery lesions and anterior tibial vessel & drug coated balloon angioplasty to the mid left SFA 95% to 0%. Stenting of proximal and mid popliteal Artery 5.0 x 100 mm Supera self-expanding stent.  Lower extremity arterial duplex 04/04/2016: No hemodynamically  significant stenoses are identified in the lower extremity arterial system. There is diffuse soft plaque in the bilateral SFA. This exam reveals mildly decreased perfusion of the right lower extremity, with RABI 0.87 and moderately decreased perfusion of the left lower extremity, with LABI 0.65 noted at the dorsalis pedis artery level with biphasic waveform. The bilateral SFA angioplasty site & left popliteal artery stent is patent. Compared to the study done on 02/09/2015, ABI improved bilaterally from right 0.73 and left 0.53.  Physical Exam  Constitutional: He is oriented to person, place, and time. Vital signs are normal. He appears well-developed and well-nourished.  HENT:  Head: Normocephalic and atraumatic.  Neck: Normal range of motion.  Cardiovascular: Regular rhythm, normal heart sounds and intact distal pulses. Bradycardia present.  Pulses:      Carotid pulses are on the left side with bruit.      Femoral pulses are 2+ on the right side with bruit and 2+ on the left side with bruit.      Popliteal pulses are 1+ on the right side and 2+ on the left side.       Dorsalis pedis pulses are 2+ on the right side and 2+ on the left side.       Posterior tibial pulses are 0 on the right side and 0 on the left side.  Pulmonary/Chest: Effort normal and breath sounds normal. No accessory muscle usage. No respiratory distress.  Abdominal: Soft. Bowel sounds are normal.  Musculoskeletal: Normal range of motion.  Neurological: He is alert and oriented to person, place, and time.  Skin: Skin is warm and dry.  Vitals reviewed.        Assessment & Recommendations:   1. Chronic heart failure with reduced ejection fraction and diastolic dysfunction (HCC) LVEF continues to be depressed at 35 to 40%, now on Entresto. Kidney function has remained stable.  Has class II NYHA symptoms.  No clinical evidence of decompensated heart failure.  Will continue with moderate dose as his blood pressure is  borderline soft. He will have reevaluated at his PCP office next month. He is on low-dose carvedilol in view of sinus bradycardia.  2. Atherosclerosis of native coronary artery of native heart without angina pectoris He is without symptoms of angina.  On appropriate medical therapy.    3.Current tobacco user Unfortunately, he continues to smoke a few cigarettes per day. I have again stressed the importance of complete smoking cessation.  Plan: I will see him back in 8-12 weeks for follow up on CHF and Entresto therapy.   Jeri Lager, FNP-C [facility], [place] Office: [phone] Fax: [phone]

## 2018-06-30 ENCOUNTER — Encounter: Payer: Self-pay | Admitting: Cardiology

## 2018-06-30 ENCOUNTER — Ambulatory Visit: Payer: PPO | Admitting: Cardiology

## 2018-06-30 ENCOUNTER — Other Ambulatory Visit: Payer: Self-pay

## 2018-06-30 VITALS — BP 113/55 | HR 52 | Ht 69.0 in | Wt 164.0 lb

## 2018-06-30 DIAGNOSIS — I5042 Chronic combined systolic (congestive) and diastolic (congestive) heart failure: Secondary | ICD-10-CM | POA: Diagnosis not present

## 2018-06-30 DIAGNOSIS — Z720 Tobacco use: Secondary | ICD-10-CM

## 2018-06-30 DIAGNOSIS — I2510 Atherosclerotic heart disease of native coronary artery without angina pectoris: Secondary | ICD-10-CM | POA: Diagnosis not present

## 2018-06-30 MED ORDER — SACUBITRIL-VALSARTAN 49-51 MG PO TABS: 1.0000 | ORAL_TABLET | Freq: Two times a day (BID) | ORAL | 3 refills | Status: DC

## 2018-07-23 ENCOUNTER — Other Ambulatory Visit: Payer: Self-pay | Admitting: Cardiology

## 2018-07-24 ENCOUNTER — Other Ambulatory Visit: Payer: Self-pay | Admitting: Cardiology

## 2018-08-03 ENCOUNTER — Other Ambulatory Visit: Payer: Self-pay | Admitting: Cardiology

## 2018-08-03 DIAGNOSIS — R0989 Other specified symptoms and signs involving the circulatory and respiratory systems: Secondary | ICD-10-CM

## 2018-08-14 ENCOUNTER — Telehealth: Payer: Self-pay

## 2018-08-14 NOTE — Telephone Encounter (Signed)
Can we see why they arent covering it? He has indication for it.

## 2018-08-31 ENCOUNTER — Ambulatory Visit: Payer: PPO | Admitting: Cardiology

## 2018-09-01 DIAGNOSIS — G609 Hereditary and idiopathic neuropathy, unspecified: Secondary | ICD-10-CM | POA: Diagnosis not present

## 2018-09-01 DIAGNOSIS — I739 Peripheral vascular disease, unspecified: Secondary | ICD-10-CM | POA: Diagnosis not present

## 2018-09-01 DIAGNOSIS — Z0000 Encounter for general adult medical examination without abnormal findings: Secondary | ICD-10-CM | POA: Diagnosis not present

## 2018-09-01 DIAGNOSIS — I10 Essential (primary) hypertension: Secondary | ICD-10-CM | POA: Diagnosis not present

## 2018-09-01 DIAGNOSIS — D649 Anemia, unspecified: Secondary | ICD-10-CM | POA: Diagnosis not present

## 2018-09-24 ENCOUNTER — Telehealth: Payer: Self-pay

## 2018-09-24 NOTE — Telephone Encounter (Signed)
Pt called and stated that medication was to expensive for him. An appeal ws done for the patient and  Medication was approved, will start patient assistance for medication, will comntact patient by end of the day Friday 09/25/18 to pick up paperwork.

## 2018-10-21 ENCOUNTER — Other Ambulatory Visit: Payer: Self-pay | Admitting: Cardiology

## 2018-10-26 ENCOUNTER — Other Ambulatory Visit: Payer: Self-pay

## 2018-10-26 DIAGNOSIS — I5042 Chronic combined systolic (congestive) and diastolic (congestive) heart failure: Secondary | ICD-10-CM

## 2018-10-26 MED ORDER — AMLODIPINE BESYLATE 10 MG PO TABS: 10.0000 mg | ORAL_TABLET | Freq: Every day | ORAL | 0 refills | Status: DC

## 2018-11-24 ENCOUNTER — Other Ambulatory Visit: Payer: Self-pay

## 2018-11-24 ENCOUNTER — Ambulatory Visit (INDEPENDENT_AMBULATORY_CARE_PROVIDER_SITE_OTHER): Payer: PPO

## 2018-11-24 DIAGNOSIS — R0989 Other specified symptoms and signs involving the circulatory and respiratory systems: Secondary | ICD-10-CM

## 2018-11-24 DIAGNOSIS — I6529 Occlusion and stenosis of unspecified carotid artery: Secondary | ICD-10-CM | POA: Diagnosis not present

## 2018-11-27 ENCOUNTER — Encounter: Payer: Self-pay | Admitting: Cardiology

## 2018-11-27 DIAGNOSIS — I10 Essential (primary) hypertension: Secondary | ICD-10-CM

## 2018-11-27 DIAGNOSIS — E785 Hyperlipidemia, unspecified: Secondary | ICD-10-CM

## 2018-11-27 HISTORY — DX: Hyperlipidemia, unspecified: E78.5

## 2018-11-27 HISTORY — DX: Essential (primary) hypertension: I10

## 2018-11-29 ENCOUNTER — Other Ambulatory Visit: Payer: Self-pay | Admitting: Cardiology

## 2018-11-29 DIAGNOSIS — I6523 Occlusion and stenosis of bilateral carotid arteries: Secondary | ICD-10-CM

## 2018-11-30 ENCOUNTER — Ambulatory Visit (INDEPENDENT_AMBULATORY_CARE_PROVIDER_SITE_OTHER): Payer: PPO | Admitting: Cardiology

## 2018-11-30 ENCOUNTER — Encounter: Payer: Self-pay | Admitting: Cardiology

## 2018-11-30 ENCOUNTER — Other Ambulatory Visit: Payer: Self-pay

## 2018-11-30 VITALS — BP 151/70 | HR 53 | Temp 97.1°F | Ht 71.0 in | Wt 170.0 lb

## 2018-11-30 DIAGNOSIS — Z720 Tobacco use: Secondary | ICD-10-CM | POA: Diagnosis not present

## 2018-11-30 DIAGNOSIS — I10 Essential (primary) hypertension: Secondary | ICD-10-CM

## 2018-11-30 DIAGNOSIS — I5042 Chronic combined systolic (congestive) and diastolic (congestive) heart failure: Secondary | ICD-10-CM

## 2018-11-30 DIAGNOSIS — E7800 Pure hypercholesterolemia, unspecified: Secondary | ICD-10-CM | POA: Diagnosis not present

## 2018-11-30 MED ORDER — ENTRESTO 49-51 MG PO TABS: 1.0000 | ORAL_TABLET | Freq: Two times a day (BID) | ORAL | 3 refills | Status: DC

## 2018-11-30 NOTE — Progress Notes (Signed)
Primary Physician:  [name], MD   Patient ID: Shawn Schmidt, male    DOB: 17-Jun-1940, 78 y.o.   MRN: [id number]  Subjective:    Chief Complaint  Patient presents with  . Coronary Artery Disease    6 month f/u  . Results  . Carotid Stenosis    HPI: Shawn Schmidt  is a 78 y.o. male  with CAD with presentation with ST elevation MI at [facility] on November 28th, 2015 with symptoms of fatigue and heaviness in his chest. underwent PCI to the mid and proximal LAD by placement of 2 overlapping mid LAD stents. He has history of hypertension, hyperlipidemia, PAD with successful complex angioplasty to distal left SFA, popliteal and left anterior tibial artery followed by stenting of the popliteal artery in 2017. He also has history of right SFA atherectomy me followed by drug coated balloon angioplasty on 06/27/2015. Also has chronic renal insufficiency and carotid artery stenosis that is been stable and is asymptomatic by carotid duplex in August 2019.  Echocardiogram was performed on 05/05/2018 that was unchanged from previous echocardiogram in 2016.  LVEF remains 35 to 40%.  He was started on Entresto in view of his continued depressed LVEF and also class II NYHA CHF symptoms. He tolerated medication well; however, is unable to afford the medication; therefore, has been out of the medication for the last 1 month.   Tolerating medications well. No complaints today. Denies any symptoms of claudication. He remains active with doing yard work. He does continue to smoke 0.5 pack per day, but states that he is working on cutting back. He has not tried any medication at this time.   Past Medical History:  Diagnosis Date  . HTN (hypertension) 11/27/2018  . Hyperlipemia 11/27/2018  . Hyperlipidemia   . Hypertension     Past Surgical History:  Procedure Laterality Date  . CARDIAC CATHETERIZATION N/A 02/21/2015   Procedure: Left Heart Cath and Coronary Angiography;  Surgeon: [name],  MD;  Location: [place] [facility];  Service: Cardiovascular;  Laterality: N/A;  . PERIPHERAL VASCULAR CATHETERIZATION Bilateral 04/25/2015   Procedure: Lower Extremity Angiography;  Surgeon: [name], MD;  Location: [place] [facility];  Service: Cardiovascular;  Laterality: Bilateral;  . PERIPHERAL VASCULAR CATHETERIZATION Left 05/30/2015   Procedure: Peripheral Vascular Intervention;  Surgeon: [name], MD;  Location: [place] [facility];  Service: Cardiovascular;  Laterality: Left;  POPLITEAL  . PERIPHERAL VASCULAR CATHETERIZATION Left 05/30/2015   Procedure: Peripheral Vascular Intervention;  Surgeon: [name], MD;  Location: [place] [facility];  Service: Cardiovascular;  Laterality: Left;  POPLITEAL    Social History   Socioeconomic History  . Marital status: Widowed    Spouse name: Not on file  . Number of children: 2  . Years of education: Not on file  . Highest education level: Not on file  Occupational History  . Not on file  Social Needs  . Financial resource strain: Not on file  . Food insecurity    Worry: Not on file    Inability: Not on file  . Transportation needs    Medical: Not on file    Non-medical: Not on file  Tobacco Use  . Smoking status: Current Every Day Smoker    Packs/day: 0.50    Types: Cigarettes  . Smokeless tobacco: Never Used  Substance and Sexual Activity  . Alcohol use: Yes    Comment: occ  . Drug use: Never  . Sexual  activity: Not on file  Lifestyle  . Physical activity    Days per week: Not on file    Minutes per session: Not on file  . Stress: Not on file  Relationships  . Social Herbalist on phone: Not on file    Gets together: Not on file    Attends religious service: Not on file    Active member of club or organization: Not on file    Attends meetings of clubs or organizations: Not on file    Relationship status: Not on file  . Intimate partner violence    Fear of current or ex partner: Not on file    Emotionally  abused: Not on file    Physically abused: Not on file    Forced sexual activity: Not on file  Other Topics Concern  . Not on file  Social History Narrative  . Not on file    Review of Systems  Constitution: Negative for decreased appetite, malaise/fatigue, weight gain and weight loss.  Eyes: Negative for visual disturbance.  Cardiovascular: Positive for dyspnea on exertion (mild). Negative for chest pain, claudication (stable; improved), leg swelling, orthopnea, palpitations and syncope.  Respiratory: Negative for hemoptysis and wheezing.   Endocrine: Negative for cold intolerance and heat intolerance.  Hematologic/Lymphatic: Does not bruise/bleed easily.  Skin: Negative for nail changes.  Musculoskeletal: Negative for muscle weakness and myalgias.  Gastrointestinal: Negative for abdominal pain, change in bowel habit, nausea and vomiting.  Neurological: Negative for difficulty with concentration, dizziness, focal weakness and headaches.  Psychiatric/Behavioral: Negative for altered mental status and suicidal ideas.  All other systems reviewed and are negative.     Objective:  Blood pressure (!) 151/70, pulse (!) 53, temperature (!) 97.1 F (36.2 C), height '5\' 11"'$  (1.803 m), weight 170 lb (77.1 kg), SpO2 99 %. Body mass index is 23.71 kg/m.    Physical Exam  Constitutional: He is oriented to person, place, and time. Vital signs are normal. He appears well-developed and well-nourished.  HENT:  Head: Normocephalic and atraumatic.  Neck: Normal range of motion.  Cardiovascular: Regular rhythm, normal heart sounds and intact distal pulses. Bradycardia present.  Pulses:      Carotid pulses are on the left side with bruit.      Femoral pulses are 2+ on the right side with bruit and 2+ on the left side with bruit.      Popliteal pulses are 2+ on the right side and 2+ on the left side.       Dorsalis pedis pulses are 2+ on the right side and 2+ on the left side.       Posterior tibial  pulses are 1+ on the right side and 1+ on the left side.  Pulmonary/Chest: Effort normal and breath sounds normal. No accessory muscle usage. No respiratory distress.  Abdominal: Soft. Bowel sounds are normal.  Musculoskeletal: Normal range of motion.  Neurological: He is alert and oriented to person, place, and time.  Skin: Skin is warm and dry.  Vitals reviewed.  Radiology: No results found.  Laboratory examination:   05/13/2018: Creatinine 1.09, EGFR 66/76, potassium 4.5, BMP normal.  05/05/2018: BNP 106.6. HgbA1c 5.9%  Labs 11/18/2017: Total cholesterol 143, triglycerides 80, HDL 57, LDL 70.  Serum glucose 104 mg, BUN 12, creatinine 1.15, eGFR 62/72 mL, potassium 4.6, CMP otherwise normal.  HB 10.5/HCT 34.2, microcytic indicis.  RDW elevated.  Platelets 324.  TSH normal.  No flowsheet data found. No flowsheet data  found. Lipid Panel  No results found for: CHOL, TRIG, HDL, CHOLHDL, VLDL, LDLCALC, LDLDIRECT HEMOGLOBIN A1C No results found for: HGBA1C, MPG TSH No results for input(s): TSH in the last 8760 hours.  PRN Meds:. Medications Discontinued During This Encounter  Medication Reason  . sacubitril-valsartan (ENTRESTO) 49-51 MG    Current Meds  Medication Sig  . amLODipine (NORVASC) 10 MG tablet Take 1 tablet (10 mg total) by mouth daily.  Marland Kitchen aspirin EC 81 MG tablet Take 1 tablet by mouth daily.  . carvedilol (COREG) 3.125 MG tablet Take by mouth 2 (two) times daily.  . cyanocobalamin 1000 MCG tablet Take 1,000 mcg by mouth daily.  . ferrous sulfate 325 (65 FE) MG tablet Take 325 mg by mouth daily with breakfast.  . nitroGLYCERIN (NITROSTAT) 0.4 MG SL tablet Place under the tongue.  . rosuvastatin (CRESTOR) 20 MG tablet Take 1 tablet by mouth once daily    Cardiac Studies:   Echocardiogram 05/05/2018: Left ventricle cavity is normal in size. Mild concentric hypertrophy of the left ventricle. Moderate decrease in global wall motion. Mid to distal anteroseptal/anterior  hypokinesis. Apical akinesis. Visual EF 35-40% Doppler evidence of grade I (impaired) diastolic dysfunction, normal LAP. Mild to moderate tricuspid regurgitation. Estimated pulmonary artery systolic pressure 23 mmHg. No significant change compared to previous study in 2016.  Carotid artery duplex 11/18/2017: Stenosis in the right common carotid artery (<50%) with homogeneous plaque. Stenosis in the left internal carotid artery (16-49%). Antegrade right vertebral artery flow. Antegrade left vertebral artery flow. Compared to the study done on 11/14/2016, no significant change. Follow up in one year is appropriate if clinically indicated.  Coronary Angiogram in [place] Acute anterior MI: Stenting with 3.5 x 12 mm proximal and 3.5 x 32 mm promos Premier DES in the mid LAD. 50-60% mid circumflex stenosis, nondominant right with a mid 85% stenosis.  Coronary Angiogram 02/21/2015 1. Widely patent mid LAD stent, 3.5 x 12 and 3.5 x 32 mm Promus placed on 03/12/2014 for acute MI, type III LAD which supplies essentially the entire anterolateral wall and inferior wall. 2. Moderate stenosis in the mid circumflex, 50%. Nondominant RCA. 3. Ischemic cardiomyopathy, LVEF 30-35% with mid to distal anterior anteroapical, apical, apical inferior, mid inferior severe hypokinesis to akinesis. 4. Limited abdominal aortogram with bifemoral runoff reveals widely patent iliac vessels, profunda femoral vessel, moderately diseased bilateral internal iliac, severely diseased bilateral profunda femoral artery.  Peripheral arteriogram 06/27/2015: Right SFA atherectomy with Turbo Hawk followed by 6x150 mm Lutonix (DCB) angioplasty. 90% to 0%.  Left leg angioplasty 05/30/15: 1.5 mm solid crown CSI atherectomy left SFA, popliteal artery lesions and anterior tibial vessel & drug coated balloon angioplasty to the mid left SFA 95% to 0%. Stenting of proximal and mid popliteal Artery 5.0 x 100 mm Supera self-expanding  stent.  Lower extremity arterial duplex 04/04/2016: No hemodynamically significant stenoses are identified in the lower extremity arterial system. There is diffuse soft plaque in the bilateral SFA. This exam reveals mildly decreased perfusion of the right lower extremity, with RABI 0.87 and moderately decreased perfusion of the left lower extremity, with LABI 0.65 noted at the dorsalis pedis artery level with biphasic waveform. The bilateral SFA angioplasty site & left popliteal artery stent is patent. Compared to the study done on 02/09/2015, ABI improved bilaterally from right 0.73 and left 0.53.  Assessment:     ICD-10-CM   1. Chronic heart failure with reduced ejection fraction and diastolic dysfunction (HCC)  I50.42 EKG 12-Lead  2. Essential hypertension  I10   3. Pure hypercholesterolemia  E78.00   4. Current tobacco use  Z72.0     EKG 11/30/2018: Sinus bradycardia at 50 bpm, normal axis, cannot exclude inferior infarct old. Deep T wave inversion in inferior, septal, and lateral leads, cannot exclude subendocardial infarct versus ischemia. Abnormal EKG. No change from EKG 04/23/2018.  Recommendations:   Patient is presently doing well, has stable dyspnea on exertion.  No symptoms of angina or clinical evidence of heart failure.  He has been out of Entresto over the last one month, started that he is unable to afford this.  Have given him samples as well as will start trying to obtain medication assistance for this.  If he is unable to afford it, we'll change back to valsartan.  Blood pressure is also elevated today, due to this.  EKG continues to show stable sinus bradycardia.  No dizziness associated with this.  In regard to PAD, he is without symptoms of claudication.  Vascular exam has improved.  He has remained active with maintaining his yard and is tolerating this well.  Otherwise he is on appropriate medical therapy.  We will see him back in 6 months or sooner if needed.  Miquel Dunn, MSN, APRN, FNP-C [facility]. [place] Office: [phone] Fax: [phone]

## 2018-12-10 NOTE — Progress Notes (Signed)
Left vm

## 2019-01-19 ENCOUNTER — Other Ambulatory Visit: Payer: Self-pay | Admitting: Cardiology

## 2019-01-19 DIAGNOSIS — Z23 Encounter for immunization: Secondary | ICD-10-CM | POA: Diagnosis not present

## 2019-01-19 DIAGNOSIS — I5042 Chronic combined systolic (congestive) and diastolic (congestive) heart failure: Secondary | ICD-10-CM

## 2019-03-04 DIAGNOSIS — I739 Peripheral vascular disease, unspecified: Secondary | ICD-10-CM | POA: Diagnosis not present

## 2019-03-04 DIAGNOSIS — I110 Hypertensive heart disease with heart failure: Secondary | ICD-10-CM | POA: Diagnosis not present

## 2019-03-04 DIAGNOSIS — Z23 Encounter for immunization: Secondary | ICD-10-CM | POA: Diagnosis not present

## 2019-03-04 DIAGNOSIS — I5042 Chronic combined systolic (congestive) and diastolic (congestive) heart failure: Secondary | ICD-10-CM | POA: Diagnosis not present

## 2019-03-04 DIAGNOSIS — Z7902 Long term (current) use of antithrombotics/antiplatelets: Secondary | ICD-10-CM | POA: Diagnosis not present

## 2019-03-04 DIAGNOSIS — I2510 Atherosclerotic heart disease of native coronary artery without angina pectoris: Secondary | ICD-10-CM | POA: Diagnosis not present

## 2019-04-26 ENCOUNTER — Other Ambulatory Visit: Payer: Self-pay | Admitting: Cardiology

## 2019-04-26 DIAGNOSIS — I5042 Chronic combined systolic (congestive) and diastolic (congestive) heart failure: Secondary | ICD-10-CM

## 2019-06-02 ENCOUNTER — Other Ambulatory Visit: Payer: Self-pay

## 2019-06-02 ENCOUNTER — Encounter: Payer: Self-pay | Admitting: Cardiology

## 2019-06-02 ENCOUNTER — Ambulatory Visit: Payer: PPO | Admitting: Cardiology

## 2019-06-02 VITALS — BP 159/70 | HR 49 | Temp 97.7°F | Ht 69.0 in | Wt 170.9 lb

## 2019-06-02 DIAGNOSIS — I10 Essential (primary) hypertension: Secondary | ICD-10-CM

## 2019-06-02 DIAGNOSIS — R001 Bradycardia, unspecified: Secondary | ICD-10-CM

## 2019-06-02 DIAGNOSIS — I5042 Chronic combined systolic (congestive) and diastolic (congestive) heart failure: Secondary | ICD-10-CM | POA: Diagnosis not present

## 2019-06-02 DIAGNOSIS — I739 Peripheral vascular disease, unspecified: Secondary | ICD-10-CM | POA: Diagnosis not present

## 2019-06-02 DIAGNOSIS — I6523 Occlusion and stenosis of bilateral carotid arteries: Secondary | ICD-10-CM

## 2019-06-02 DIAGNOSIS — E7800 Pure hypercholesterolemia, unspecified: Secondary | ICD-10-CM

## 2019-06-02 MED ORDER — SPIRONOLACTONE 25 MG PO TABS: 25.0000 mg | ORAL_TABLET | Freq: Every day | ORAL | 2 refills | Status: DC

## 2019-06-02 NOTE — Progress Notes (Signed)
Primary Physician:  [name], MD   Patient ID: Shawn Schmidt, male    DOB: [birth year], 79 y.o.   MRN: [id number]  Subjective:    Chief Complaint  Patient presents with  . PAD  . Congestive Heart Failure  . Follow-up    HPI: Shawn Schmidt  is a 79 y.o. male  with CAD with presentation with ST elevation MI at [facility] on November 28th, 2015 with symptoms of fatigue and heaviness in his chest. underwent PCI to the mid and proximal LAD by placement of 2 overlapping mid LAD stents. He has history of hypertension, hyperlipidemia, PAD with successful complex angioplasty to distal left SFA, popliteal and left anterior tibial artery followed by stenting of the popliteal artery in 2017. He also has history of right SFA atherectomy me followed by drug coated balloon angioplasty on 06/27/2015. Also has chronic renal insufficiency and carotid artery stenosis that is been stable and is asymptomatic by carotid duplex in August 2019.  Echocardiogram was performed on 05/05/2018 that was unchanged from previous echocardiogram in 2016.  LVEF remains 35 to 40%.  He was started on Entresto in view of his continued depressed LVEF and also class II NYHA CHF symptoms. He reports that he is only able to tolerate once a day dosing due to headaches and making him feel dizzy first thing in the morning. Since he has been using once a day dosing, his symptoms have resolved. He has not been monitoring his blood pressure.  Tolerating medications well. No complaints today. Denies any symptoms of claudication. He remains active with doing yard work. He does continue to smoke 0.5 pack per day.  Past Medical History:  Diagnosis Date  . HTN (hypertension) 11/27/2018  . Hyperlipemia 11/27/2018  . Hyperlipidemia   . Hypertension     Past Surgical History:  Procedure Laterality Date  . CARDIAC CATHETERIZATION N/A 02/21/2015   Procedure: Left Heart Cath and Coronary Angiography;  Surgeon: [name], MD;   Location: [place] [facility];  Service: Cardiovascular;  Laterality: N/A;  . PERIPHERAL VASCULAR CATHETERIZATION Bilateral 04/25/2015   Procedure: Lower Extremity Angiography;  Surgeon: [name], MD;  Location: [place] [facility];  Service: Cardiovascular;  Laterality: Bilateral;  . PERIPHERAL VASCULAR CATHETERIZATION Left 05/30/2015   Procedure: Peripheral Vascular Intervention;  Surgeon: [name], MD;  Location: [place] [facility];  Service: Cardiovascular;  Laterality: Left;  POPLITEAL  . PERIPHERAL VASCULAR CATHETERIZATION Left 05/30/2015   Procedure: Peripheral Vascular Intervention;  Surgeon: [name], MD;  Location: [place] [facility];  Service: Cardiovascular;  Laterality: Left;  POPLITEAL    Social History   Socioeconomic History  . Marital status: Widowed    Spouse name: Not on file  . Number of children: 2  . Years of education: Not on file  . Highest education level: Not on file  Occupational History  . Not on file  Tobacco Use  . Smoking status: Current Every Day Smoker    Packs/day: 0.50    Types: Cigarettes  . Smokeless tobacco: Never Used  Substance and Sexual Activity  . Alcohol use: Yes    Comment: occ  . Drug use: Never  . Sexual activity: Not on file  Other Topics Concern  . Not on file  Social History Narrative  . Not on file   Social Determinants of Health   Financial Resource Strain:   . Difficulty of Paying Living Expenses: Not on file  Food Insecurity:   .  Worried About Charity fundraiser in the Last Year: Not on file  . Ran Out of Food in the Last Year: Not on file  Transportation Needs:   . Lack of Transportation (Medical): Not on file  . Lack of Transportation (Non-Medical): Not on file  Physical Activity:   . Days of Exercise per Week: Not on file  . Minutes of Exercise per Session: Not on file  Stress:   . Feeling of Stress : Not on file  Social Connections:   . Frequency of Communication with Friends and Family: Not on file  .  Frequency of Social Gatherings with Friends and Family: Not on file  . Attends Religious Services: Not on file  . Active Member of Clubs or Organizations: Not on file  . Attends Archivist Meetings: Not on file  . Marital Status: Not on file  Intimate Partner Violence:   . Fear of Current or Ex-Partner: Not on file  . Emotionally Abused: Not on file  . Physically Abused: Not on file  . Sexually Abused: Not on file    Review of Systems  Constitution: Negative for decreased appetite, malaise/fatigue, weight gain and weight loss.  Eyes: Negative for visual disturbance.  Cardiovascular: Positive for dyspnea on exertion (mild). Negative for chest pain, claudication (stable; improved), leg swelling, orthopnea, palpitations and syncope.  Respiratory: Negative for hemoptysis and wheezing.   Endocrine: Negative for cold intolerance and heat intolerance.  Hematologic/Lymphatic: Does not bruise/bleed easily.  Skin: Negative for nail changes.  Musculoskeletal: Negative for muscle weakness and myalgias.  Gastrointestinal: Negative for abdominal pain, change in bowel habit, nausea and vomiting.  Neurological: Negative for difficulty with concentration, dizziness, focal weakness and headaches.  Psychiatric/Behavioral: Negative for altered mental status and suicidal ideas.  All other systems reviewed and are negative.     Objective:  Blood pressure (!) 159/70, pulse (!) 49, temperature 97.7 F (36.5 C), height '5\' 9"'$  (1.753 m), weight 170 lb 14.4 oz (77.5 kg), SpO2 98 %. Body mass index is 25.24 kg/m.    Physical Exam  Constitutional: He is oriented to person, place, and time. Vital signs are normal. He appears well-developed and well-nourished.  HENT:  Head: Normocephalic and atraumatic.  Cardiovascular: Regular rhythm, normal heart sounds and intact distal pulses. Bradycardia present.  Pulses:      Carotid pulses are on the left side with bruit.      Femoral pulses are 2+ on the  right side with bruit and 2+ on the left side with bruit.      Popliteal pulses are 2+ on the right side and 2+ on the left side.       Dorsalis pedis pulses are 2+ on the right side and 2+ on the left side.       Posterior tibial pulses are 1+ on the right side and 1+ on the left side.  Pulmonary/Chest: Effort normal and breath sounds normal. No accessory muscle usage. No respiratory distress.  Abdominal: Soft. Bowel sounds are normal.  Musculoskeletal:        General: Normal range of motion.     Cervical back: Normal range of motion.  Neurological: He is alert and oriented to person, place, and time.  Skin: Skin is warm and dry.  Vitals reviewed.  Radiology: No results found.  Laboratory examination:   05/13/2018: Creatinine 1.09, EGFR 66/76, potassium 4.5, BMP normal.  05/05/2018: BNP 106.6. HgbA1c 5.9%  Labs 11/18/2017: Total cholesterol 143, triglycerides 80, HDL 57, LDL 70.  Serum glucose 104 mg, BUN 12, creatinine 1.15, eGFR 62/72 mL, potassium 4.6, CMP otherwise normal.  HB 10.5/HCT 34.2, microcytic indicis.  RDW elevated.  Platelets 324.  TSH normal.  No flowsheet data found. No flowsheet data found. Lipid Panel  No results found for: CHOL, TRIG, HDL, CHOLHDL, VLDL, LDLCALC, LDLDIRECT HEMOGLOBIN A1C No results found for: HGBA1C, MPG TSH No results for input(s): TSH in the last 8760 hours.  PRN Meds:. There are no discontinued medications. Current Meds  Medication Sig  . amLODipine (NORVASC) 10 MG tablet Take 1 tablet by mouth once daily  . aspirin EC 81 MG tablet Take 1 tablet by mouth daily.  . carvedilol (COREG) 3.125 MG tablet Take 1 tablet by mouth twice daily  . CYANOCOBALAMIN PO Take 1,000 mcg by mouth daily. sublingual  . ferrous sulfate 325 (65 FE) MG tablet Take 325 mg by mouth daily with breakfast.  . nitroGLYCERIN (NITROSTAT) 0.4 MG SL tablet Place under the tongue.  . rosuvastatin (CRESTOR) 20 MG tablet Take 1 tablet by mouth once daily  .  sacubitril-valsartan (ENTRESTO) 49-51 MG Take 1 tablet by mouth 2 (two) times daily. (Patient taking differently: Take 1 tablet by mouth daily. Could not tolerate bid)    Cardiac Studies:   Echocardiogram 05/05/2018: Left ventricle cavity is normal in size. Mild concentric hypertrophy of the left ventricle. Moderate decrease in global wall motion. Mid to distal anteroseptal/anterior hypokinesis. Apical akinesis. Visual EF 35-40% Doppler evidence of grade I (impaired) diastolic dysfunction, normal LAP. Mild to moderate tricuspid regurgitation. Estimated pulmonary artery systolic pressure 23 mmHg. No significant change compared to previous study in 2016.  Carotid artery duplex 11/18/2017: Stenosis in the right common carotid artery (<50%) with homogeneous plaque. Stenosis in the left internal carotid artery (16-49%). Antegrade right vertebral artery flow. Antegrade left vertebral artery flow. Compared to the study done on 11/14/2016, no significant change. Follow up in one year is appropriate if clinically indicated.  Coronary Angiogram in [place] Acute anterior MI: Stenting with 3.5 x 12 mm proximal and 3.5 x 32 mm promos Premier DES in the mid LAD. 50-60% mid circumflex stenosis, nondominant right with a mid 85% stenosis.  Coronary Angiogram 02/21/2015 1. Widely patent mid LAD stent, 3.5 x 12 and 3.5 x 32 mm Promus placed on 03/12/2014 for acute MI, type III LAD which supplies essentially the entire anterolateral wall and inferior wall. 2. Moderate stenosis in the mid circumflex, 50%. Nondominant RCA. 3. Ischemic cardiomyopathy, LVEF 30-35% with mid to distal anterior anteroapical, apical, apical inferior, mid inferior severe hypokinesis to akinesis. 4. Limited abdominal aortogram with bifemoral runoff reveals widely patent iliac vessels, profunda femoral vessel, moderately diseased bilateral internal iliac, severely diseased bilateral profunda femoral artery.  Peripheral  arteriogram 06/27/2015: Right SFA atherectomy with Turbo Hawk followed by 6x150 mm Lutonix (DCB) angioplasty. 90% to 0%.  Left leg angioplasty 05/30/15: 1.5 mm solid crown CSI atherectomy left SFA, popliteal artery lesions and anterior tibial vessel & drug coated balloon angioplasty to the mid left SFA 95% to 0%. Stenting of proximal and mid popliteal Artery 5.0 x 100 mm Supera self-expanding stent.  Lower extremity arterial duplex 04/04/2016: No hemodynamically significant stenoses are identified in the lower extremity arterial system. There is diffuse soft plaque in the bilateral SFA. This exam reveals mildly decreased perfusion of the right lower extremity, with RABI 0.87 and moderately decreased perfusion of the left lower extremity, with LABI 0.65 noted at the dorsalis pedis artery level with biphasic waveform. The bilateral  SFA angioplasty site & left popliteal artery stent is patent. Compared to the study done on 02/09/2015, ABI improved bilaterally from right 0.73 and left 0.53.  Assessment:     ICD-10-CM   1. Chronic heart failure with reduced ejection fraction and diastolic dysfunction (HCC)  I50.42 EKG 28-FVWA    Basic metabolic panel  2. PAD (peripheral artery disease) (HCC)  I73.9   3. Bradycardia  R00.1   4. Essential hypertension  I10   5. Pure hypercholesterolemia  E78.00 Lipid Profile  6. Bilateral carotid artery stenosis  I65.23     EKG 06/02/2019: Sinus bradycardia at 46 bpm, normal axis, cannot exclude inferior infarct old. Deep T wave inversion in inferior, septal, and lateral leads, cannot exclude subendocardial infarct versus ischemia. Abnormal EKG. No change from EKG 11/30/2018.  Recommendations:   Patient is here for 14-monthfollow-up for CHF.  He is doing well with continued class NYHA II symptoms that have been stable.  No clinical evidence of decompensated heart failure.  He is on Entresto therapy, but only able to tolerate once a day dosing due to headache and  dizziness.  He has since not had any further dizziness since being on once a day dosing.  He is markedly bradycardic, but is asymptomatic.  He low-dose Coreg, but him in view of his CHF, will continue with low-dose for now.  We will continue to closely monitor, may need to consider discontinuing in view of his age.    His blood pressure is elevated today, will add Aldactone 25 mg daily.  He will need BMP in 2 weeks for continued surveillance.  I will also add lipid panel to be performed at that time as this has not recently been evaluated.  He is due for carotid duplex, will be scheduled in the next few weeks.  In regards to PAD, vascular exam has been stable.  No symptoms of claudication.  No symptoms of angina. He has recently been able to cut trees in his yard that he tolerated well.  He continues to smoke half a pack per day, encouraged him to work on complete smoking cessation.  He is due for carotid duplex for continued surveillance of left ICA stenosis.  We will plan to see him back in 4 weeks for follow-up on hypertension.  AMiquel Dunn MSN, APRN, FNP-C [facility]. PSouth PrairieOffice: [phone]: [phone]

## 2019-06-09 DIAGNOSIS — E7800 Pure hypercholesterolemia, unspecified: Secondary | ICD-10-CM | POA: Diagnosis not present

## 2019-06-09 DIAGNOSIS — I5042 Chronic combined systolic (congestive) and diastolic (congestive) heart failure: Secondary | ICD-10-CM | POA: Diagnosis not present

## 2019-06-10 ENCOUNTER — Other Ambulatory Visit: Payer: Self-pay

## 2019-06-10 ENCOUNTER — Ambulatory Visit: Payer: PPO

## 2019-06-10 DIAGNOSIS — I6523 Occlusion and stenosis of bilateral carotid arteries: Secondary | ICD-10-CM | POA: Diagnosis not present

## 2019-06-10 LAB — LIPID PANEL
Chol/HDL Ratio: 2.5 ratio (ref 0.0–5.0)
Cholesterol, Total: 163 mg/dL (ref 100–199)
HDL: 65 mg/dL (ref >39)
LDL Chol Calc (NIH): 84 mg/dL (ref 0–99)
Triglycerides: 75 mg/dL (ref 0–149)
VLDL Cholesterol Cal: 14 mg/dL (ref 5–40)

## 2019-06-10 LAB — BASIC METABOLIC PANEL
BUN/Creatinine Ratio: 12 (ref 10–24)
BUN: 13 mg/dL (ref 8–27)
CO2: 21 mmol/L (ref 20–29)
Calcium: 9.7 mg/dL (ref 8.6–10.2)
Chloride: 107 mmol/L — ABNORMAL HIGH (ref 96–106)
Creatinine, Ser: 1.11 mg/dL (ref 0.76–1.27)
GFR calc Af Amer: 74 mL/min/{1.73_m2} (ref >59)
GFR calc non Af Amer: 64 mL/min/{1.73_m2} (ref >59)
Glucose: 105 mg/dL — ABNORMAL HIGH (ref 65–99)
Potassium: 5.2 mmol/L (ref 3.5–5.2)
Sodium: 146 mmol/L — ABNORMAL HIGH (ref 134–144)

## 2019-06-12 ENCOUNTER — Other Ambulatory Visit: Payer: Self-pay | Admitting: Cardiology

## 2019-06-12 DIAGNOSIS — I6523 Occlusion and stenosis of bilateral carotid arteries: Secondary | ICD-10-CM

## 2019-06-30 ENCOUNTER — Ambulatory Visit: Payer: PPO | Admitting: Cardiology

## 2019-06-30 ENCOUNTER — Encounter: Payer: Self-pay | Admitting: Cardiology

## 2019-06-30 ENCOUNTER — Other Ambulatory Visit: Payer: Self-pay

## 2019-06-30 ENCOUNTER — Other Ambulatory Visit: Payer: Self-pay | Admitting: Cardiology

## 2019-06-30 VITALS — BP 135/63 | HR 52 | Temp 97.3°F | Ht 69.0 in | Wt 175.0 lb

## 2019-06-30 DIAGNOSIS — F17210 Nicotine dependence, cigarettes, uncomplicated: Secondary | ICD-10-CM | POA: Diagnosis not present

## 2019-06-30 DIAGNOSIS — I2510 Atherosclerotic heart disease of native coronary artery without angina pectoris: Secondary | ICD-10-CM

## 2019-06-30 DIAGNOSIS — I5042 Chronic combined systolic (congestive) and diastolic (congestive) heart failure: Secondary | ICD-10-CM | POA: Diagnosis not present

## 2019-06-30 DIAGNOSIS — I739 Peripheral vascular disease, unspecified: Secondary | ICD-10-CM

## 2019-06-30 DIAGNOSIS — I10 Essential (primary) hypertension: Secondary | ICD-10-CM | POA: Diagnosis not present

## 2019-06-30 DIAGNOSIS — Z720 Tobacco use: Secondary | ICD-10-CM | POA: Diagnosis not present

## 2019-06-30 DIAGNOSIS — R001 Bradycardia, unspecified: Secondary | ICD-10-CM

## 2019-06-30 DIAGNOSIS — E782 Mixed hyperlipidemia: Secondary | ICD-10-CM | POA: Diagnosis not present

## 2019-06-30 DIAGNOSIS — Z955 Presence of coronary angioplasty implant and graft: Secondary | ICD-10-CM

## 2019-06-30 DIAGNOSIS — I252 Old myocardial infarction: Secondary | ICD-10-CM | POA: Insufficient documentation

## 2019-06-30 DIAGNOSIS — I255 Ischemic cardiomyopathy: Secondary | ICD-10-CM | POA: Insufficient documentation

## 2019-06-30 DIAGNOSIS — I6522 Occlusion and stenosis of left carotid artery: Secondary | ICD-10-CM

## 2019-06-30 MED ORDER — HYDRALAZINE HCL 10 MG PO TABS
10.0000 mg | ORAL_TABLET | Freq: Three times a day (TID) | ORAL | 1 refills | Status: DC
Start: 2019-06-30 — End: 2019-08-26

## 2019-06-30 MED ORDER — EZETIMIBE 10 MG PO TABS: 10.0000 mg | ORAL_TABLET | Freq: Every day | ORAL | 3 refills | Status: DC

## 2019-06-30 MED ORDER — SPIRONOLACTONE 25 MG PO TABS: 12.5000 mg | ORAL_TABLET | Freq: Every day | ORAL | 2 refills | Status: DC

## 2019-06-30 NOTE — Progress Notes (Signed)
Primary Physician:  [name], MD   Patient ID: Shawn Schmidt, male    DOB: August 05, 1940, 79 y.o.   MRN: [id number]  Subjective:    Chief Complaint  Patient presents with  . Hypertension    follow up  . Congestive Heart Failure  . Cardiomyopathy    HPI: Shawn Schmidt  is a 79 y.o. male with prior history of ST segment elevation MI, status post angioplasty and stenting, established CAD, peripheral artery disease, hypertension, hyperlipidemia, active smoker, advanced age presents to the office for the management of hypertension, heart failure, cardiomyopathy.  Cardiomyopathy/congestive heart failure: Patient's last echocardiogram was in January 2020 which noted moderately reduced left ventricular systolic function consistent with ischemic cardiomyopathy.  Patient's been seen periodically in the office for medication titration to guideline directed medical therapy as tolerated.  He was started on Entresto and then later uptitrated to 49/51 p.o. twice daily.  However, patient was experiencing dizziness and lightheadedness and therefore he was recommended to be on Entresto 24/26 mg p.o. twice daily.  However, due to insurance coverage he can only get 60 pills at a time and therefore he wanted to be on the higher dose once a day.  He is tolerating Entresto 49/51 p.o. daily without dizziness.  He was also started on Aldactone 25 mg p.o. daily.  Patient tolerated medication well without any side effects or intolerances.  Blood work reviewed and shows potassium to be at high limit of normal.    Symptomatically patient is doing well.  He does not have any orthopnea, paroxysmal nocturnal dyspnea or limiting swelling.  He states that he continues to be physically active and is doing yard work on a periodic basis such as cutting trees and does get short of breath with exertional activity more than usual.  Patient denies any chest pain and no recent use of sublingual nitroglycerin  tablets.  Hypertension: Patient carries a history of high blood pressure.  Patient states that his blood pressures at home are between 944-967 mmHg and diastolic blood pressures are between 70-80 mmHg.  And his pulse ranges between 55 to 65 bpm.  He does not check his blood pressure at home regularly.   Past Medical History:  Diagnosis Date  . Carotid artery occlusion   . CHF (congestive heart failure) ([place])   . Coronary artery disease   . HTN (hypertension) 11/27/2018  . Hyperlipemia 11/27/2018  . Hyperlipidemia   . Hypertension   . Ischemic cardiomyopathy   . Peripheral artery disease [facility])     Past Surgical History:  Procedure Laterality Date  . CARDIAC CATHETERIZATION N/A 02/21/2015   Procedure: Left Heart Cath and Coronary Angiography;  Surgeon: [name], MD;  Location: [facility];  Service: Cardiovascular;  Laterality: N/A;  . CORONARY ANGIOPLASTY    . PERIPHERAL VASCULAR CATHETERIZATION Bilateral 04/25/2015   Procedure: Lower Extremity Angiography;  Surgeon: [name], MD;  Location: [facility] [facility];  Service: Cardiovascular;  Laterality: Bilateral;  . PERIPHERAL VASCULAR CATHETERIZATION Left 05/30/2015   Procedure: Peripheral Vascular Intervention;  Surgeon: [name], MD;  Location: [place] [facility];  Service: Cardiovascular;  Laterality: Left;  POPLITEAL  . PERIPHERAL VASCULAR CATHETERIZATION Left 05/30/2015   Procedure: Peripheral Vascular Intervention;  Surgeon: [name], MD;  Location: [place] [facility];  Service: Cardiovascular;  Laterality: Left;  POPLITEAL    Social History   Socioeconomic History  . Marital status: Widowed    Spouse name: Not on file  .  Number of children: 2  . Years of education: Not on file  . Highest education level: Not on file  Occupational History  . Not on file  Tobacco Use  . Smoking status: Current Every Day Smoker    Packs/day: 1.00    Types: Cigarettes  . Smokeless tobacco: Never Used  Substance and Sexual Activity   . Alcohol use: Yes    Comment: occ  . Drug use: Never  . Sexual activity: Not on file  Other Topics Concern  . Not on file  Social History Narrative  . Not on file   Social Determinants of Health   Financial Resource Strain:   . Difficulty of Paying Living Expenses:   Food Insecurity:   . Worried About Charity fundraiser in the Last Year:   . Arboriculturist in the Last Year:   Transportation Needs:   . Film/video editor (Medical):   Marland Kitchen Lack of Transportation (Non-Medical):   Physical Activity:   . Days of Exercise per Week:   . Minutes of Exercise per Session:   Stress:   . Feeling of Stress :   Social Connections:   . Frequency of Communication with Friends and Family:   . Frequency of Social Gatherings with Friends and Family:   . Attends Religious Services:   . Active Member of Clubs or Organizations:   . Attends Archivist Meetings:   Marland Kitchen Marital Status:   Intimate Partner Violence:   . Fear of Current or Ex-Partner:   . Emotionally Abused:   Marland Kitchen Physically Abused:   . Sexually Abused:     Review of Systems  Constitution: Negative for decreased appetite, malaise/fatigue, weight gain and weight loss.  Eyes: Negative for visual disturbance.  Cardiovascular: Positive for dyspnea on exertion (Not with regular activities, more pronounced with exertion.). Negative for chest pain, claudication (stable; improved), leg swelling, orthopnea, palpitations and syncope.  Respiratory: Negative for hemoptysis and wheezing.   Endocrine: Negative for cold intolerance and heat intolerance.  Hematologic/Lymphatic: Does not bruise/bleed easily.  Skin: Negative for nail changes.  Musculoskeletal: Negative for muscle weakness and myalgias.  Gastrointestinal: Negative for abdominal pain, change in bowel habit, nausea and vomiting.  Neurological: Negative for difficulty with concentration, dizziness, focal weakness and headaches.  Psychiatric/Behavioral: Negative for altered  mental status and suicidal ideas.  All other systems reviewed and are negative.     Objective:  Blood pressure 135/63, pulse (!) 52, temperature (!) 97.3 F (36.3 C), temperature source Temporal, height '5\' 9"'$  (1.753 m), weight 175 lb (79.4 kg). Body mass index is 25.84 kg/m.    Physical Exam  Constitutional: He is oriented to person, place, and time. Vital signs are normal. He appears well-developed and well-nourished.  HENT:  Head: Normocephalic and atraumatic.  Cardiovascular: Regular rhythm, normal heart sounds and intact distal pulses. Bradycardia present.  Pulses:      Carotid pulses are on the left side with bruit.      Femoral pulses are 2+ on the right side with bruit and 2+ on the left side with bruit.      Popliteal pulses are 2+ on the right side and 2+ on the left side.       Dorsalis pedis pulses are 2+ on the right side and 2+ on the left side.       Posterior tibial pulses are 1+ on the right side and 1+ on the left side.  Pulmonary/Chest: Effort normal and breath sounds normal.  No accessory muscle usage. No respiratory distress.  Abdominal: Soft. Bowel sounds are normal.  Musculoskeletal:        General: Normal range of motion.     Cervical back: Normal range of motion.  Neurological: He is alert and oriented to person, place, and time.  Skin: Skin is warm and dry.  Vitals reviewed.  Radiology: No results found.  Laboratory examination:   05/13/2018: Creatinine 1.09, EGFR 66/76, potassium 4.5, BMP normal.  05/05/2018: BNP 106.6. HgbA1c 5.9%  Labs 11/18/2017: Total cholesterol 143, triglycerides 80, HDL 57, LDL 70.  Serum glucose 104 mg, BUN 12, creatinine 1.15, eGFR 62/72 mL, potassium 4.6, CMP otherwise normal.  HB 10.5/HCT 34.2, microcytic indicis.  RDW elevated.  Platelets 324.  TSH normal.  CMP Latest Ref Rng & Units 06/09/2019  Glucose 65 - 99 mg/dL 105(H)  BUN 8 - 27 mg/dL 13  Creatinine 0.76 - 1.27 mg/dL 1.11  Sodium 134 - 144 mmol/L 146(H)  Potassium  3.5 - 5.2 mmol/L 5.2  Chloride 96 - 106 mmol/L 107(H)  CO2 20 - 29 mmol/L 21  Calcium 8.6 - 10.2 mg/dL 9.7   No flowsheet data found. Lipid Panel     Component Value Date/Time   CHOL 163 06/09/2019 1024   TRIG 75 06/09/2019 1024   HDL 65 06/09/2019 1024   CHOLHDL 2.5 06/09/2019 1024   LDLCALC 84 06/09/2019 1024   HEMOGLOBIN A1C No results found for: HGBA1C, MPG TSH No results for input(s): TSH in the last 8760 hours.  Medications Discontinued During This Encounter  Medication Reason  . amLODipine (NORVASC) 10 MG tablet Change in therapy  . spironolactone (ALDACTONE) 25 MG tablet    Meds ordered this encounter  Medications  . ezetimibe (ZETIA) 10 MG tablet    Sig: Take 1 tablet (10 mg total) by mouth daily.    Dispense:  90 tablet    Refill:  3  . hydrALAZINE (APRESOLINE) 10 MG tablet    Sig: Take 1 tablet (10 mg total) by mouth 3 (three) times daily.    Dispense:  90 tablet    Refill:  1  . spironolactone (ALDACTONE) 25 MG tablet    Sig: Take 0.5 tablets (12.5 mg total) by mouth daily.    Dispense:  15 tablet    Refill:  2    Current Meds  Medication Sig  . aspirin EC 81 MG tablet Take 1 tablet by mouth daily.  . carvedilol (COREG) 3.125 MG tablet Take 1 tablet by mouth twice daily  . CYANOCOBALAMIN PO Take 1,000 mcg by mouth daily. sublingual  . ferrous sulfate 325 (65 FE) MG tablet Take 325 mg by mouth daily with breakfast.  . nitroGLYCERIN (NITROSTAT) 0.4 MG SL tablet Place under the tongue.  . rosuvastatin (CRESTOR) 20 MG tablet Take 1 tablet by mouth once daily  . sacubitril-valsartan (ENTRESTO) 49-51 MG Take 1 tablet by mouth 2 (two) times daily. (Patient taking differently: Take 1 tablet by mouth daily. Could not tolerate bid)  . spironolactone (ALDACTONE) 25 MG tablet Take 0.5 tablets (12.5 mg total) by mouth daily.  . [DISCONTINUED] amLODipine (NORVASC) 10 MG tablet Take 1 tablet by mouth once daily  . [DISCONTINUED] spironolactone (ALDACTONE) 25 MG tablet  Take 1 tablet (25 mg total) by mouth daily.    Cardiac Studies:   EKG 06/02/2019: Sinus bradycardia at 46 bpm, normal axis, cannot exclude inferior infarct old. Deep T wave inversion in inferior, septal, and lateral leads, cannot exclude subendocardial infarct versus ischemia.  Abnormal EKG. No change from EKG 11/30/2018.  Echocardiogram 05/05/2018: Left ventricle cavity is normal in size. Mild concentric hypertrophy of the left ventricle. Moderate decrease in global wall motion. Mid to distal anteroseptal/anterior hypokinesis. Apical akinesis. Visual EF 35-40% Doppler evidence of grade I (impaired) diastolic dysfunction, normal LAP. Mild to moderate tricuspid regurgitation. Estimated pulmonary artery systolic pressure 23 mmHg. No significant change compared to previous study in 2016.  Carotid artery duplex  06/10/2019:  Duplex suggests stenosis in the right internal carotid artery (16-49%).  Duplex suggests stenosis in the right external carotid artery (<50%).  Duplex suggests stenosis in the left internal carotid artery (50-69%).  Antegrade right vertebral artery flow. Antegrade left vertebral artery  flow.  Compared to 11/24/2018, left ICA stenosis has progressed from <50%. Follow  up in six months is appropriate if clinically indicated.  Coronary Angiogram in [place] Acute anterior MI: Stenting with 3.5 x 12 mm proximal and 3.5 x 32 mm promos Premier DES in the mid LAD. 50-60% mid circumflex stenosis, nondominant right with a mid 85% stenosis.  Coronary Angiogram 02/21/2015 1. Widely patent mid LAD stent, 3.5 x 12 and 3.5 x 32 mm Promus placed on 03/12/2014 for acute MI, type III LAD which supplies essentially the entire anterolateral wall and inferior wall. 2. Moderate stenosis in the mid circumflex, 50%. Nondominant RCA. 3. Ischemic cardiomyopathy, LVEF 30-35% with mid to distal anterior anteroapical, apical, apical inferior, mid inferior severe hypokinesis to akinesis. 4.  Limited abdominal aortogram with bifemoral runoff reveals widely patent iliac vessels, profunda femoral vessel, moderately diseased bilateral internal iliac, severely diseased bilateral profunda femoral artery.  Peripheral arteriogram 06/27/2015: Right SFA atherectomy with Turbo Hawk followed by 6x150 mm Lutonix (DCB) angioplasty. 90% to 0%.  Left leg angioplasty 05/30/15: 1.5 mm solid crown CSI atherectomy left SFA, popliteal artery lesions and anterior tibial vessel & drug coated balloon angioplasty to the mid left SFA 95% to 0%. Stenting of proximal and mid popliteal Artery 5.0 x 100 mm Supera self-expanding stent.  Lower extremity arterial duplex 04/04/2016: No hemodynamically significant stenoses are identified in the lower extremity arterial system. There is diffuse soft plaque in the bilateral SFA. This exam reveals mildly decreased perfusion of the right lower extremity, with RABI 0.87 and moderately decreased perfusion of the left lower extremity, with LABI 0.65 noted at the dorsalis pedis artery level with biphasic waveform. The bilateral SFA angioplasty site & left popliteal artery stent is patent. Compared to the study done on 02/09/2015, ABI improved bilaterally from right 0.73 and left 0.53.  Assessment:     ICD-10-CM   1. Chronic heart failure with reduced ejection fraction and diastolic dysfunction (HCC)  I50.42 hydrALAZINE (APRESOLINE) 10 MG tablet    spironolactone (ALDACTONE) 25 MG tablet    Basic metabolic panel    Magnesium    Pro b natriuretic peptide (BNP)[facility])  2. Ischemic cardiomyopathy  I25.5   3. History of ST elevation myocardial infarction (STEMI)  I25.2   4. Stented coronary artery  Z95.5   5. Atherosclerosis of native coronary artery of native heart without angina pectoris  I25.10   6. Mixed hyperlipidemia  E78.2 ezetimibe (ZETIA) 10 MG tablet  7. Atherosclerosis of left carotid artery  I65.22 ezetimibe (ZETIA) 10 MG tablet    PCV  CAROTID DUPLEX (BILATERAL)  8. PAD (peripheral artery disease) (HCC)  I73.9   9. Essential hypertension  I10   10. Bradycardia  R00.1   11. Current tobacco use  Z72.0  Recommendations:   Chronic heart failure with reduced EF, NYHA class I/II:  Ejection Fraction noted on last 2D Echo  Recommend daily weight check, strict I/O's  Fluid restriction to <2L per day, Na restriction < 2g per day  Currently on Coreg, not uptitrating the medication secondary to asymptomatic bradycardia.  Patient is currently on Entresto 49/51 p.o. daily.  Patient cannot tolerate twice daily dosing as this causes him to be dizzy.  Encouraged him to be on 24/26 mg p.o. twice daily of Entresto.  However, because of insurance coverage he can only get 60 pills at a time.  Patient states that he currently is able to get 60 pills of Entresto 49/51 mg which is 26-monthsupply as opposed to being on 24/26 mg p.o. twice daily and getting 1 month supply for the same amount of price.  Patient is currently on Aldactone; however, given the potassium levels would like to decrease it to 12.5 mg p.o. daily  We discussed being on BiDil; however, this is cost prohibitive.  In addition, patient is considering being on ED medications and therefore being on nitrates would not be safe.  Therefore, we will start the patient only on hydralazine 10 mg p.o. 3 times daily and uptitrate as needed.  We will discontinue Norvasc given his underlying ischemic cardiomyopathy and LV function.  Patient was offered to [facility] the management of his chronic congestive heart failure and hypertension.  However, patient refused.  Patient was also offered assistance program to see if EDelene Lollcan be covered at a lower cost to the patient but he also refused assistance in this regard as well.  Prior to the next office visit we will check a BMP, magnesium level, and  proBNP.  Ischemic cardiomyopathy: See above  History of STEMI status post angioplasty and stent: Chronic and stable patient does not have anginal symptoms.  Continue current medical therapy  Carotid artery atherosclerosis:  Based on the most recent carotid duplex the disease process is progressive.  Patient already has known CAD and PAD and educated on the importance of risk factor modifications.  Smoking cessation reemphasized; however, patient does not have a quit date nor does he want pharmacological therapy to help him quit smoking.  I have asked him to discuss this further with his primary care physician.  Patient is currently on Crestor and tolerating it well.  We will add Zetia 10 mg p.o. daily  Repeat carotid duplex in 6 months as recommended to reevaluate disease progression.  Mixed hyperlipidemia: . Continue statin therapy.   . Added Zetia 10 mg p.o. daily . Patient denies myalgia or other side effects.  Asymptomatic bradycardia:  Educated on symptoms of bradycardia at today's office visit.  Continue Coreg at the current dose given his underlying ischemic cardiomyopathy, chronic heart failure with reduced EF, and CAD.  Patient may benefit from a monitor, this can be discussed at the next office visit.  Active smoking:  Smoking cessation reemphasized for at least 5 minutes during this encounter; however, patient does not have a quit date nor does he want pharmacological therapy to help him quit smoking.  I have asked him to discuss this further with his primary care physician.  Orders Placed This Encounter  Procedures  . Basic metabolic panel  . Magnesium  . Pro b natriuretic peptide (BNP)[facility] [facility])  . PCV CAROTID DUPLEX (BILATERAL)   Evaluation and management (541m) with time spent obtaining history,  performing exam, reviewing labs, studies, outside records, greater than 50% of that time was spent in counseling and coordination care with  the patient regarding complex decision making and discussion as state above, and documenting clinical evaluation.  --Continue cardiac medications as reconciled in final medication list. --Return in about 3 months (around 10/01/2019) for HF management, remind to have labs done prior to appt. . Or sooner if needed. --Continue follow-up with your primary care physician regarding the management of your other chronic comorbid conditions.  Patient's questions and concerns were addressed to his satisfaction. He voices understanding of the instructions provided during this encounter.    This note was created using a voice recognition software as a result there may be grammatical errors inadvertently enclosed that do not reflect the nature of this encounter. Every attempt is made to correct such errors.  Rex Kras, DO, [facility]. [place] Office: [phone]

## 2019-06-30 NOTE — Patient Instructions (Signed)
Please remember to bring in your medication bottles in at the next visit.   New Medications that were added at today's visit:  Hydralazine 10 mg p.o. 3 times daily Zetia 10 mg p.o. daily Decrease spironolactone to 12.5 mg p.o. every morning  Medications that were discontinued at today's visit: Stop amlodipine/Norvasc  Office will call you to have the following tests scheduled:  Carotid duplex in 6 months  Please get labs done in 3 months prior to the next office visit at the nearest [place] follow up with your PCP as scheduled.

## 2019-07-09 ENCOUNTER — Ambulatory Visit: Payer: PPO | Admitting: Cardiology

## 2019-07-15 DIAGNOSIS — I63412 Cerebral infarction due to embolism of left middle cerebral artery: Secondary | ICD-10-CM

## 2019-07-15 HISTORY — DX: Cerebral infarction due to embolism of left middle cerebral artery: I63.412

## 2019-07-26 ENCOUNTER — Other Ambulatory Visit: Payer: Self-pay | Admitting: Cardiology

## 2019-07-29 ENCOUNTER — Inpatient Hospital Stay (HOSPITAL_COMMUNITY)
Admission: EM | Admit: 2019-07-29 | Discharge: 2019-07-31 | DRG: 065 | Disposition: A | Discharge status: Home / Self Care | Payer: PPO | Attending: Family Medicine | Admitting: Family Medicine

## 2019-07-29 ENCOUNTER — Emergency Department (HOSPITAL_COMMUNITY): Payer: PPO

## 2019-07-29 ENCOUNTER — Encounter (HOSPITAL_COMMUNITY): Payer: Self-pay | Admitting: *Deleted

## 2019-07-29 ENCOUNTER — Other Ambulatory Visit: Payer: Self-pay

## 2019-07-29 ENCOUNTER — Inpatient Hospital Stay (HOSPITAL_COMMUNITY): Payer: PPO

## 2019-07-29 DIAGNOSIS — Z9861 Coronary angioplasty status: Secondary | ICD-10-CM | POA: Diagnosis not present

## 2019-07-29 DIAGNOSIS — R4701 Aphasia: Secondary | ICD-10-CM | POA: Diagnosis not present

## 2019-07-29 DIAGNOSIS — N179 Acute kidney failure, unspecified: Secondary | ICD-10-CM | POA: Diagnosis present

## 2019-07-29 DIAGNOSIS — I2510 Atherosclerotic heart disease of native coronary artery without angina pectoris: Secondary | ICD-10-CM | POA: Diagnosis present

## 2019-07-29 DIAGNOSIS — I361 Nonrheumatic tricuspid (valve) insufficiency: Secondary | ICD-10-CM

## 2019-07-29 DIAGNOSIS — I739 Peripheral vascular disease, unspecified: Secondary | ICD-10-CM | POA: Diagnosis not present

## 2019-07-29 DIAGNOSIS — I63412 Cerebral infarction due to embolism of left middle cerebral artery: Principal | ICD-10-CM | POA: Diagnosis present

## 2019-07-29 DIAGNOSIS — I5022 Chronic systolic (congestive) heart failure: Secondary | ICD-10-CM | POA: Diagnosis not present

## 2019-07-29 DIAGNOSIS — F17210 Nicotine dependence, cigarettes, uncomplicated: Secondary | ICD-10-CM | POA: Diagnosis not present

## 2019-07-29 DIAGNOSIS — Z79899 Other long term (current) drug therapy: Secondary | ICD-10-CM | POA: Diagnosis not present

## 2019-07-29 DIAGNOSIS — Z20822 Contact with and (suspected) exposure to covid-19: Secondary | ICD-10-CM | POA: Diagnosis not present

## 2019-07-29 DIAGNOSIS — R001 Bradycardia, unspecified: Secondary | ICD-10-CM | POA: Diagnosis not present

## 2019-07-29 DIAGNOSIS — I110 Hypertensive heart disease with heart failure: Secondary | ICD-10-CM | POA: Diagnosis present

## 2019-07-29 DIAGNOSIS — I10 Essential (primary) hypertension: Secondary | ICD-10-CM | POA: Diagnosis present

## 2019-07-29 DIAGNOSIS — I340 Nonrheumatic mitral (valve) insufficiency: Secondary | ICD-10-CM

## 2019-07-29 DIAGNOSIS — G8191 Hemiplegia, unspecified affecting right dominant side: Secondary | ICD-10-CM | POA: Diagnosis not present

## 2019-07-29 DIAGNOSIS — Z7982 Long term (current) use of aspirin: Secondary | ICD-10-CM | POA: Diagnosis not present

## 2019-07-29 DIAGNOSIS — I255 Ischemic cardiomyopathy: Secondary | ICD-10-CM | POA: Diagnosis present

## 2019-07-29 DIAGNOSIS — I639 Cerebral infarction, unspecified: Secondary | ICD-10-CM | POA: Diagnosis not present

## 2019-07-29 DIAGNOSIS — I6389 Other cerebral infarction: Secondary | ICD-10-CM | POA: Diagnosis not present

## 2019-07-29 DIAGNOSIS — E785 Hyperlipidemia, unspecified: Secondary | ICD-10-CM | POA: Diagnosis present

## 2019-07-29 DIAGNOSIS — Z03818 Encounter for observation for suspected exposure to other biological agents ruled out: Secondary | ICD-10-CM | POA: Diagnosis not present

## 2019-07-29 DIAGNOSIS — I63233 Cerebral infarction due to unspecified occlusion or stenosis of bilateral carotid arteries: Secondary | ICD-10-CM | POA: Diagnosis not present

## 2019-07-29 LAB — DIFFERENTIAL
Abs Immature Granulocytes: 0.01 10*3/uL (ref 0.00–0.07)
Basophils Absolute: 0 10*3/uL (ref 0.0–0.1)
Basophils Relative: 0 %
Eosinophils Absolute: 0.1 10*3/uL (ref 0.0–0.5)
Eosinophils Relative: 2 %
Immature Granulocytes: 0 %
Lymphocytes Relative: 45 %
Lymphs Abs: 2 10*3/uL (ref 0.7–4.0)
Monocytes Absolute: 0.4 10*3/uL (ref 0.1–1.0)
Monocytes Relative: 8 %
Neutro Abs: 2 10*3/uL (ref 1.7–7.7)
Neutrophils Relative %: 45 %

## 2019-07-29 LAB — RAPID URINE DRUG SCREEN, HOSP PERFORMED
Amphetamines: NOT DETECTED
Barbiturates: NOT DETECTED
Benzodiazepines: NOT DETECTED
Cocaine: NOT DETECTED
Opiates: NOT DETECTED
Tetrahydrocannabinol: NOT DETECTED

## 2019-07-29 LAB — I-STAT CHEM 8, ED
BUN: 7 mg/dL — ABNORMAL LOW (ref 8–23)
Calcium, Ion: 1.21 mmol/L (ref 1.15–1.40)
Chloride: 108 mmol/L (ref 98–111)
Creatinine, Ser: 1.3 mg/dL — ABNORMAL HIGH (ref 0.61–1.24)
Glucose, Bld: 106 mg/dL — ABNORMAL HIGH (ref 70–99)
HCT: 42 % (ref 39.0–52.0)
Hemoglobin: 14.3 g/dL (ref 13.0–17.0)
Potassium: 3.5 mmol/L (ref 3.5–5.1)
Sodium: 141 mmol/L (ref 135–145)
TCO2: 26 mmol/L (ref 22–32)

## 2019-07-29 LAB — URINALYSIS, ROUTINE W REFLEX MICROSCOPIC
Bacteria, UA: NONE SEEN
Bilirubin Urine: NEGATIVE
Glucose, UA: NEGATIVE mg/dL
Ketones, ur: NEGATIVE mg/dL
Leukocytes,Ua: NEGATIVE
Nitrite: NEGATIVE
Protein, ur: NEGATIVE mg/dL
Specific Gravity, Urine: 1.035 — ABNORMAL HIGH (ref 1.005–1.030)
pH: 6 (ref 5.0–8.0)

## 2019-07-29 LAB — COMPREHENSIVE METABOLIC PANEL
ALT: 17 U/L (ref 0–44)
AST: 21 U/L (ref 15–41)
Albumin: 3.6 g/dL (ref 3.5–5.0)
Alkaline Phosphatase: 58 U/L (ref 38–126)
Anion gap: 11 (ref 5–15)
BUN: 5 mg/dL — ABNORMAL LOW (ref 8–23)
CO2: 20 mmol/L — ABNORMAL LOW (ref 22–32)
Calcium: 9.1 mg/dL (ref 8.9–10.3)
Chloride: 110 mmol/L (ref 98–111)
Creatinine, Ser: 1.21 mg/dL (ref 0.61–1.24)
GFR calc Af Amer: >60 mL/min (ref >60)
GFR calc non Af Amer: 58 mL/min — ABNORMAL LOW (ref >60)
Glucose, Bld: 105 mg/dL — ABNORMAL HIGH (ref 70–99)
Potassium: 3.6 mmol/L (ref 3.5–5.1)
Sodium: 141 mmol/L (ref 135–145)
Total Bilirubin: 0.8 mg/dL (ref 0.3–1.2)
Total Protein: 7.3 g/dL (ref 6.5–8.1)

## 2019-07-29 LAB — CBC
HCT: 44.5 % (ref 39.0–52.0)
Hemoglobin: 14.6 g/dL (ref 13.0–17.0)
MCH: 32.3 pg (ref 26.0–34.0)
MCHC: 32.8 g/dL (ref 30.0–36.0)
MCV: 98.5 fL (ref 80.0–100.0)
Platelets: 233 10*3/uL (ref 150–400)
RBC: 4.52 MIL/uL (ref 4.22–5.81)
RDW: 13.7 % (ref 11.5–15.5)
WBC: 4.6 10*3/uL (ref 4.0–10.5)
nRBC: 0 % (ref 0.0–0.2)

## 2019-07-29 LAB — ETHANOL: Alcohol, Ethyl (B): <10 mg/dL (ref <10)

## 2019-07-29 LAB — CBG MONITORING, ED: Glucose-Capillary: 97 mg/dL (ref 70–99)

## 2019-07-29 LAB — ECHOCARDIOGRAM COMPLETE
Height: 69 in
Weight: 2780.8 oz

## 2019-07-29 LAB — SARS CORONAVIRUS 2 (TAT 6-24 HRS): SARS Coronavirus 2: NEGATIVE

## 2019-07-29 LAB — APTT: aPTT: 30 seconds (ref 24–36)

## 2019-07-29 LAB — PROTIME-INR
INR: 1 (ref 0.8–1.2)
Prothrombin Time: 13.5 seconds (ref 11.4–15.2)

## 2019-07-29 MED ORDER — FERROUS SULFATE 325 (65 FE) MG PO TABS
325.0000 mg | ORAL_TABLET | Freq: Every day | ORAL | Status: DC
  Administered 2019-07-30 – 2019-07-31 (×2): 325 mg via ORAL
  Filled 2019-07-29 (×2): qty 1

## 2019-07-29 MED ORDER — IOHEXOL 350 MG/ML SOLN
50.0000 mL | Freq: Once | INTRAVENOUS | Status: AC | PRN
  Administered 2019-07-29: 50 mL via INTRAVENOUS

## 2019-07-29 MED ORDER — IOHEXOL 350 MG/ML SOLN
40.0000 mL | Freq: Once | INTRAVENOUS | Status: AC | PRN
  Administered 2019-07-29: 40 mL via INTRAVENOUS

## 2019-07-29 MED ORDER — STROKE: EARLY STAGES OF RECOVERY BOOK
Freq: Once | Status: DC
  Filled 2019-07-29: qty 1

## 2019-07-29 MED ORDER — ENOXAPARIN SODIUM 40 MG/0.4ML ~~LOC~~ SOLN
40.0000 mg | SUBCUTANEOUS | Status: DC
  Administered 2019-07-29 – 2019-07-30 (×2): 40 mg via SUBCUTANEOUS
  Filled 2019-07-29 (×2): qty 0.4

## 2019-07-29 MED ORDER — EZETIMIBE 10 MG PO TABS
10.0000 mg | ORAL_TABLET | Freq: Every day | ORAL | Status: DC
  Administered 2019-07-30 – 2019-07-31 (×2): 10 mg via ORAL
  Filled 2019-07-29 (×2): qty 1

## 2019-07-29 MED ORDER — ACETAMINOPHEN 160 MG/5ML PO SOLN: 650.0000 mg | ORAL | Status: DC | PRN

## 2019-07-29 MED ORDER — CLOPIDOGREL BISULFATE 300 MG PO TABS
300.0000 mg | ORAL_TABLET | Freq: Once | ORAL | Status: AC
  Administered 2019-07-29: 300 mg via ORAL
  Filled 2019-07-29: qty 1

## 2019-07-29 MED ORDER — CLOPIDOGREL BISULFATE 75 MG PO TABS
75.0000 mg | ORAL_TABLET | Freq: Every day | ORAL | Status: DC
  Administered 2019-07-30 – 2019-07-31 (×2): 75 mg via ORAL
  Filled 2019-07-29 (×2): qty 1

## 2019-07-29 MED ORDER — ASPIRIN EC 325 MG PO TBEC
325.0000 mg | DELAYED_RELEASE_TABLET | Freq: Every day | ORAL | Status: DC
  Administered 2019-07-29 – 2019-07-31 (×3): 325 mg via ORAL
  Filled 2019-07-29 (×3): qty 1

## 2019-07-29 MED ORDER — ACETAMINOPHEN 650 MG RE SUPP: 650.0000 mg | RECTAL | Status: DC | PRN

## 2019-07-29 MED ORDER — ADULT MULTIVITAMIN W/MINERALS CH
1.0000 | ORAL_TABLET | Freq: Every day | ORAL | Status: DC
  Administered 2019-07-30 – 2019-07-31 (×2): 1 via ORAL
  Filled 2019-07-29 (×2): qty 1

## 2019-07-29 MED ORDER — ROSUVASTATIN CALCIUM 20 MG PO TABS
20.0000 mg | ORAL_TABLET | Freq: Every day | ORAL | Status: DC
  Administered 2019-07-30 – 2019-07-31 (×2): 20 mg via ORAL
  Filled 2019-07-29: qty 1
  Filled 2019-07-29: qty 4

## 2019-07-29 MED ORDER — ACETAMINOPHEN 325 MG PO TABS
650.0000 mg | ORAL_TABLET | ORAL | Status: DC | PRN
  Administered 2019-07-30: 650 mg via ORAL
  Filled 2019-07-29: qty 2

## 2019-07-29 NOTE — ED Provider Notes (Signed)
[facility] EMERGENCY DEPARTMENT Provider Note   CSN: [id number] Arrival date & time: 07/29/19  1150     History No chief complaint on file.   Shawn Schmidt is a 79 y.o. male.  HPI 79 year old male presents today with right-sided weakness.  Last known normal at 9 PM last night when he went to bed.  He now presents with right-sided weakness.  Reports that blood sugar is normal.  No report of blood thinner use.  He denies headache Patient initiated at the bridge as code stroke due to right-sided weakness with some expressive aphasia.    Past Medical History:  Diagnosis Date  . Carotid artery occlusion   . CHF (congestive heart failure) ([place])   . Coronary artery disease   . HTN (hypertension) 11/27/2018  . Hyperlipemia 11/27/2018  . Hyperlipidemia   . Hypertension   . Ischemic cardiomyopathy   . Peripheral artery disease [facility])     Patient Active Problem List   Diagnosis Date Noted  . Ischemic cardiomyopathy 06/30/2019  . History of ST elevation myocardial infarction (STEMI) 06/30/2019  . Stented coronary artery 06/30/2019  . Atherosclerosis of left carotid artery 06/30/2019  . PAD (peripheral artery disease) ([place]) 06/30/2019  . Essential hypertension 06/30/2019  . Bradycardia 06/30/2019  . HTN (hypertension) 11/27/2018  . Hyperlipemia 11/27/2018  . Chronic heart failure with reduced ejection fraction and diastolic dysfunction ([place]) 06/13/2018  . Bilateral carotid artery stenosis 06/13/2018  . Current tobacco use 06/13/2018  . Claudication in peripheral vascular disease ([place]) 04/23/2015  . Atherosclerosis of native coronary artery of native heart without angina pectoris 03/12/2014    Past Surgical History:  Procedure Laterality Date  . CARDIAC CATHETERIZATION N/A 02/21/2015   Procedure: Left Heart Cath and Coronary Angiography;  Surgeon: [name], MD;  Location: [facility];  Service: Cardiovascular;  Laterality: N/A;  . CORONARY ANGIOPLASTY     . PERIPHERAL VASCULAR CATHETERIZATION Bilateral 04/25/2015   Procedure: Lower Extremity Angiography;  Surgeon: [name], MD;  Location: [facility];  Service: Cardiovascular;  Laterality: Bilateral;  . PERIPHERAL VASCULAR CATHETERIZATION Left 05/30/2015   Procedure: Peripheral Vascular Intervention;  Surgeon: [name], MD;  Location: [facility];  Service: Cardiovascular;  Laterality: Left;  POPLITEAL  . PERIPHERAL VASCULAR CATHETERIZATION Left 05/30/2015   Procedure: Peripheral Vascular Intervention;  Surgeon: [name], MD;  Location: [facility];  Service: Cardiovascular;  Laterality: Left;  POPLITEAL       Family History  Problem Relation Age of Onset  . Hypertension Mother   . Hypertension Father     Social History   Tobacco Use  . Smoking status: Current Every Day Smoker    Packs/day: 1.00    Types: Cigarettes  . Smokeless tobacco: Never Used  Substance Use Topics  . Alcohol use: Yes    Comment: occ  . Drug use: Never    Home Medications Prior to Admission medications   Medication Sig Start Date End Date Taking? Authorizing Provider  aspirin EC 81 MG tablet Take 1 tablet by mouth daily. 12/12/15   [provider]  carvedilol (COREG) 3.125 MG tablet Take 1 tablet by mouth twice daily 07/26/19   Tolia, Sunit, DO  CYANOCOBALAMIN PO Take 1,000 mcg by mouth daily. sublingual    [provider]  ezetimibe (ZETIA) 10 MG tablet Take 1 tablet (10 mg total) by mouth daily. 06/30/19 09/28/19  Tolia, Sunit, DO  ferrous sulfate 325 (65 FE) MG tablet Take  325 mg by mouth daily with breakfast.    [provider]  hydrALAZINE (APRESOLINE) 10 MG tablet Take 1 tablet (10 mg total) by mouth 3 (three) times daily. 06/30/19 09/28/19  Tolia, Sunit, DO  nitroGLYCERIN (NITROSTAT) 0.4 MG SL tablet Place under the tongue.    [provider]  rosuvastatin (CRESTOR) 20 MG tablet Take 1 tablet by mouth once daily 07/26/19   Tolia, Sunit, DO    sacubitril-valsartan (ENTRESTO) 49-51 MG Take 1 tablet by mouth 2 (two) times daily. Patient taking differently: Take 1 tablet by mouth daily. Could not tolerate bid 11/30/18   Miquel Dunn, NP  spironolactone (ALDACTONE) 25 MG tablet Take 0.5 tablets (12.5 mg total) by mouth daily. 06/30/19 09/28/19  Rex Kras, DO    Allergies    Other  Review of Systems   Review of Systems  Unable to perform ROS: Acuity of condition    Physical Exam Updated Vital Signs There were no vitals taken for this visit.  Physical Exam Vitals and nursing note reviewed.  Constitutional:      Appearance: Normal appearance. He is normal weight.  HENT:     Head: Normocephalic and atraumatic.     Right Ear: External ear normal.     Left Ear: External ear normal.     Nose: Nose normal.     Mouth/Throat:     Mouth: Mucous membranes are moist.  Eyes:     Extraocular Movements: Extraocular movements intact.     Pupils: Pupils are equal, round, and reactive to light.  Cardiovascular:     Rate and Rhythm: Normal rate and regular rhythm.     Pulses: Normal pulses.     Heart sounds: Normal heart sounds.     Comments: Right sided chest wall lipoma noted Pulmonary:     Effort: Pulmonary effort is normal.     Breath sounds: Normal breath sounds.  Abdominal:     General: Abdomen is flat. Bowel sounds are normal.     Palpations: Abdomen is soft.     Tenderness: There is no abdominal tenderness.  Musculoskeletal:        General: No swelling, tenderness, deformity or signs of injury.     Cervical back: Normal range of motion.  Skin:    General: Skin is warm and dry.     Capillary Refill: Capillary refill takes less than 2 seconds.  Neurological:     Mental Status: He is alert.     Comments: Right palmar drift and right leg drift noted Extraocular movements intact Patient with some expressive aphasia No visual field deficit noted  Psychiatric:        Mood and Affect: Mood normal.     ED  Results / Procedures / Treatments   Labs (all labs ordered are listed, but only abnormal results are displayed) Labs Reviewed  ETHANOL  PROTIME-INR  APTT  CBC  DIFFERENTIAL  COMPREHENSIVE METABOLIC PANEL  RAPID URINE DRUG SCREEN, HOSP PERFORMED  URINALYSIS, ROUTINE W REFLEX MICROSCOPIC  CBG MONITORING, ED  I-STAT CHEM 8, ED    EKG None  Radiology CT Code Stroke CTA Head W/WO contrast  Result Date: 07/29/2019 CLINICAL DATA:  Stroke EXAM: CT ANGIOGRAPHY HEAD AND NECK TECHNIQUE: Multidetector CT imaging of the head and neck was performed using the standard protocol during bolus administration of intravenous contrast. Multiplanar CT image reconstructions and MIPs were obtained to evaluate the vascular anatomy. Carotid stenosis measurements (when applicable) are obtained utilizing NASCET criteria, using the distal internal  carotid diameter as the denominator. CONTRAST:  33mL OMNIPAQUE IOHEXOL 350 MG/ML SOLN COMPARISON:  CT head immediately preceding the CTA. FINDINGS: CTA NECK FINDINGS Aortic arch: Atherosclerotic aortic arch. Four vessel branching of the arch with left vertebral artery origin from the arch. Atherosclerotic disease and mild stenosis proximal left subclavian artery. Right carotid system: Atherosclerotic disease right carotid bifurcation without significant stenosis. Beading of the right internal carotid artery compatible with fibromuscular dysplasia without aneurysm or dissection. Left carotid system: Left common carotid artery widely patent. Atherosclerotic disease left carotid bifurcation. Approximately 25% diameter stenosis proximal left internal carotid artery due to noncalcified plaque. Fibromuscular dysplasia in the left internal carotid artery without dissection or aneurysm. Vertebral arteries: Right vertebral artery is widely patent. Left vertebral artery origin from the aortic arch without significant stenosis. Skeleton: Cervical spondylosis without acute skeletal  abnormality. Other neck: Negative for mass or adenopathy in the neck. Upper chest: No acute abnormality in the lung apices bilaterally. Review of the MIP images confirms the above findings CTA HEAD FINDINGS Anterior circulation: Atherosclerotic calcification in the cavernous carotid bilaterally. Mild to moderate stenosis in the cavernous carotid bilaterally. Short segment occlusion left M2 branch most likely due to clot. Left M1 segment widely patent. Right middle cerebral artery patent without significant stenosis. Posterior circulation: Both vertebral arteries patent to the basilar. PICA patent bilaterally. Basilar widely patent. Superior cerebellar and posterior cerebral arteries patent bilaterally without stenosis. Venous sinuses: Normal venous enhancement Anatomic variants: None Review of the MIP images confirms the above findings IMPRESSION: 1. Short segment occlusion left M2 branch consistent with acute thrombus. 2. Mild to moderate atherosclerotic stenosis in the cavernous carotid bilaterally. 3. Mild atherosclerotic disease carotid bifurcation bilaterally without significant stenosis. 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 4. Both vertebral arteries widely patent without stenosis. 5. These results were called by telephone at the time of interpretation on 07/29/2019 at 12:33 pm to provider [facility] , who verbally acknowledged these results. Electronically Signed   By: Franchot Gallo M.D.   On: 07/29/2019 12:33   CT Code Stroke CTA Neck W/WO contrast  Result Date: 07/29/2019 CLINICAL DATA:  Stroke EXAM: CT ANGIOGRAPHY HEAD AND NECK TECHNIQUE: Multidetector CT imaging of the head and neck was performed using the standard protocol during bolus administration of intravenous contrast. Multiplanar CT image reconstructions and MIPs were obtained to evaluate the vascular anatomy. Carotid stenosis measurements (when applicable) are obtained utilizing NASCET criteria, using  the distal internal carotid diameter as the denominator. CONTRAST:  52mL OMNIPAQUE IOHEXOL 350 MG/ML SOLN COMPARISON:  CT head immediately preceding the CTA. FINDINGS: CTA NECK FINDINGS Aortic arch: Atherosclerotic aortic arch. Four vessel branching of the arch with left vertebral artery origin from the arch. Atherosclerotic disease and mild stenosis proximal left subclavian artery. Right carotid system: Atherosclerotic disease right carotid bifurcation without significant stenosis. Beading of the right internal carotid artery compatible with fibromuscular dysplasia without aneurysm or dissection. Left carotid system: Left common carotid artery widely patent. Atherosclerotic disease left carotid bifurcation. Approximately 25% diameter stenosis proximal left internal carotid artery due to noncalcified plaque. Fibromuscular dysplasia in the left internal carotid artery without dissection or aneurysm. Vertebral arteries: Right vertebral artery is widely patent. Left vertebral artery origin from the aortic arch without significant stenosis. Skeleton: Cervical spondylosis without acute skeletal abnormality. Other neck: Negative for mass or adenopathy in the neck. Upper chest: No acute abnormality in the lung apices bilaterally. Review of the MIP images confirms the above findings CTA HEAD FINDINGS Anterior  circulation: Atherosclerotic calcification in the cavernous carotid bilaterally. Mild to moderate stenosis in the cavernous carotid bilaterally. Short segment occlusion left M2 branch most likely due to clot. Left M1 segment widely patent. Right middle cerebral artery patent without significant stenosis. Posterior circulation: Both vertebral arteries patent to the basilar. PICA patent bilaterally. Basilar widely patent. Superior cerebellar and posterior cerebral arteries patent bilaterally without stenosis. Venous sinuses: Normal venous enhancement Anatomic variants: None Review of the MIP images confirms the above  findings IMPRESSION: 1. Short segment occlusion left M2 branch consistent with acute thrombus. 2. Mild to moderate atherosclerotic stenosis in the cavernous carotid bilaterally. 3. Mild atherosclerotic disease carotid bifurcation bilaterally without significant stenosis. 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 4. Both vertebral arteries widely patent without stenosis. 5. These results were called by telephone at the time of interpretation on 07/29/2019 at 12:33 pm to provider [facility] , who verbally acknowledged these results. Electronically Signed   By: Franchot Gallo M.D.   On: 07/29/2019 12:33   CT HEAD CODE STROKE WO CONTRAST  Result Date: 07/29/2019 CLINICAL DATA:  Code stroke. Right-sided weakness and right facial droop. 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 EXAM: CT HEAD WITHOUT CONTRAST TECHNIQUE: Contiguous axial images were obtained from the base of the skull through the vertex without intravenous contrast. COMPARISON:  None. FINDINGS: Brain: Hypodensity in the left temporal lobe compatible with acute infarct. No associated hemorrhage. Mild atrophy and mild chronic microvascular ischemic changes in the white matter. Negative for mass lesion. Vascular: Negative for hyperdense vessel Skull: 1 cm lucency in the right parietal bone over the convexity. No other skeletal lesion identified. Sinuses/Orbits: Mild mucosal edema paranasal sinuses. Normal orbit bilaterally. Other: None ASPECTS ([place] Stroke Program Early CT Score) - Ganglionic level infarction (caudate, lentiform nuclei, internal capsule, insula, M1-M3 cortex): 6 - Supraganglionic infarction (M4-M6 cortex): 3 Total score (0-10 with 10 being normal): 9 IMPRESSION: 1. Acute infarct left temporal lobe without hemorrhage. 2. ASPECTS is 9 3. Results texted to Dr. Leonel Ramsay Electronically Signed   By: Franchot Gallo M.D.   On: 07/29/2019 12:20    Procedures .Critical Care Performed by: Pattricia Boss, MD Authorized  by: Pattricia Boss, MD   Critical care provider statement:    Critical care time (minutes):  45   Critical care end time:  07/29/2019 12:55 PM   Critical care was necessary to treat or prevent imminent or life-threatening deterioration of the following conditions:  CNS failure or compromise   Critical care was time spent personally by me on the following activities:  Discussions with consultants, evaluation of patient's response to treatment, examination of patient, ordering and performing treatments and interventions, ordering and review of laboratory studies, ordering and review of radiographic studies, pulse oximetry, re-evaluation of patient's condition, obtaining history from patient or surrogate and review of old charts   (including critical care time)  Medications Ordered in ED Medications - No data to display  ED Course  I have reviewed the triage vital signs and the nursing notes.  Pertinent labs & imaging results that were available during my care of the patient were reviewed by me and considered in my medical decision making (see chart for details). Dr. Leonel Ramsay at bedside in CT Patient reevaluated with neurology team. Right leg weakness appears somewhat improved as does aphasia Dr. Leonel Ramsay states that there is some LVO noted and patient will have perfusion study and decisions regarding IR made at that time 12:49 PM Dr. Leonel Ramsay advises that no further intervention is required at  this time and patient should be admitted to the hospitalist team.    Vitals:   07/29/19 1243  BP: (!) 175/63  Pulse: (!) 50  Resp: 20  Temp: (!) 97.5 F (36.4 C)  SpO2: 98%   This patient complains of right side weakness, this involves an extensive number of treatment options, and is a complaint that carries with it a high risk of complications and morbidity.  The differential diagnosis includes ich, ischemic stroke   I Ordered, reviewed, and interpreted labs, which included cbc and  cmet   I ordered imaging studies which included CT brain  I independently visualized and interpreted imaging which showed acute infarct left temporal lobe Additional history obtained from wife  Previous records obtained and reviewed through epic  I consulted Joes and  and discussed lab and imaging findings  Critical interventions: Identified as code stroke,   After the interventions stated above, I reevaluated the patient and found stable for prior exam  Discussed with Dr. Doristine Bosworth and she will see for admission  MDM Rules/Calculators/A&P                      Final Clinical Impression(s) / ED Diagnoses Final diagnoses:  Cerebrovascular accident (CVA), unspecified mechanism ([place])    Rx / [place] Orders ED Discharge Orders    None       Pattricia Boss, MD 07/29/19 1337

## 2019-07-29 NOTE — Code Documentation (Signed)
79 yo male coming from home where he was noted to have sudden onset of right facial droop, right arm and leg weakness, and slurred speech when he woke up at 0600. LKW 2100. Daughter brought the patient into the ED private vehicle.   Code Stroke called in triage. Stroke Team met patient in CT. CT Head negative for hemorrhage. IV placed for CTA. CTA completed. M2 occlusion noted on scan. Pt taken back to complete CTP - no core or penumbra noted on CTP.   Pt returned to ED - Trauma A. Placed on cardiac monitor. Initial NIHSS 5 - see Stroke Timeline. Plan - Cont. to monitor for worsening. Remains in IR window until 4/15 2100 - Reactivate Code Stroke if pt worsens. Q2 mNIHSS and VS. Handoff given to Claiborne Billings, Therapist, sports.

## 2019-07-29 NOTE — H&P (Signed)
History and Physical    Shawn Schmidt [id number] DOB: [birth year] DOA: 07/29/2019  PCP: [name], MD  Patient coming from: Home I have personally briefly reviewed patient's old medical records in [place]  Chief Complaint: Right-sided weakness  HPI: Shawn Schmidt is a 79 y.o. male with medical history significant of ischemic cardiomyopathy, coronary artery disease, carotid stenosis, hyperlipidemia, hypertension, tobacco abuse, peripheral artery disease presents to emergency department due to right-sided weakness which started this morning.  He tells me that he was doing fine last night however this morning he stated that he could not move at all.  No headache, blurry vision, slurred speech, facial droop, loss of consciousness, seizures, previous history of stroke, chest pain, shortness of breath, palpitation, leg swelling, fever, chills, nausea, vomiting, urinary or bowel changes.  He lives with his daughter at home.  Smokes 1 pack of cigarettes per day, drinks alcohol occasionally.  No history of illicit drug use.  ED Course: Upon arrival to ED: Patient's blood pressure in 175/63, heart rate in 50s, CBC, CMP, PT/INR, blood glucose, ethanol: WNL.  UA, UDS, COVID-19 pending.  Code stroke was called as patient had right-sided weakness with some expressive aphasia.  CT head without contrast shows acute infarct of left temporal lobe without hemorrhage.  EDP consulted neurology.  [facility] hospitalist consulted for admission for stroke management.  Review of Systems: As per HPI otherwise negative.    Past Medical History:  Diagnosis Date  . Carotid artery occlusion   . CHF (congestive heart failure) ([place])   . Coronary artery disease   . HTN (hypertension) 11/27/2018  . Hyperlipemia 11/27/2018  . Hyperlipidemia   . Hypertension   . Ischemic cardiomyopathy   . Peripheral artery disease [facility])     Past Surgical History:  Procedure Laterality Date  . CARDIAC CATHETERIZATION  N/A 02/21/2015   Procedure: Left Heart Cath and Coronary Angiography;  Surgeon: [name], MD;  Location: [place] [facility];  Service: Cardiovascular;  Laterality: N/A;  . CORONARY ANGIOPLASTY    . PERIPHERAL VASCULAR CATHETERIZATION Bilateral 04/25/2015   Procedure: Lower Extremity Angiography;  Surgeon: [name], MD;  Location: [place] [facility];  Service: Cardiovascular;  Laterality: Bilateral;  . PERIPHERAL VASCULAR CATHETERIZATION Left 05/30/2015   Procedure: Peripheral Vascular Intervention;  Surgeon: [name], MD;  Location: [place] [facility];  Service: Cardiovascular;  Laterality: Left;  POPLITEAL  . PERIPHERAL VASCULAR CATHETERIZATION Left 05/30/2015   Procedure: Peripheral Vascular Intervention;  Surgeon: [name], MD;  Location: [place] [facility];  Service: Cardiovascular;  Laterality: Left;  POPLITEAL     reports that he has been smoking cigarettes. He has been smoking about 1.00 pack per day. He has never used smokeless tobacco. He reports current alcohol use. He reports that he does not use drugs.  Allergies  Allergen Reactions  . Lisinopril Other (See Comments)    Hyper activity  . Other     Halothane///Anesthesia - test was done to show allergy - Son had very bad experience- life threatening     Family History  Problem Relation Age of Onset  . Hypertension Mother   . Hypertension Father     Prior to Admission medications   Medication Sig Start Date End Date Taking? Authorizing Provider  acetaminophen (TYLENOL) 500 MG tablet Take 1,000 mg by mouth every 6 (six) hours as needed for mild pain or headache.   Yes [provider]  aspirin EC 81 MG tablet Take 1 tablet  by mouth daily. 12/12/15  Yes [provider]  carvedilol (COREG) 3.125 MG tablet Take 1 tablet by mouth twice daily 07/26/19  Yes Tolia, Sunit, DO  CYANOCOBALAMIN PO Take 1,000 mcg by mouth daily. sublingual   Yes [provider]  ezetimibe (ZETIA) 10 MG tablet Take 1 tablet (10 mg  total) by mouth daily. 06/30/19 09/28/19 Yes Tolia, Sunit, DO  ferrous sulfate 325 (65 FE) MG tablet Take 325 mg by mouth daily with breakfast.   Yes [provider]  hydrALAZINE (APRESOLINE) 10 MG tablet Take 1 tablet (10 mg total) by mouth 3 (three) times daily. 06/30/19 09/28/19 Yes Tolia, Sunit, DO  Multiple Vitamin (MULTIVITAMIN) capsule Take 1 capsule by mouth daily.   Yes [provider]  nitroGLYCERIN (NITROSTAT) 0.4 MG SL tablet Place under the tongue.   Yes [provider]  rosuvastatin (CRESTOR) 20 MG tablet Take 1 tablet by mouth once daily 07/26/19  Yes Tolia, Sunit, DO  sacubitril-valsartan (ENTRESTO) 49-51 MG Take 1 tablet by mouth 2 (two) times daily. Patient taking differently: Take 1 tablet by mouth daily. Could not tolerate bid 11/30/18  Yes Miquel Dunn, NP  spironolactone (ALDACTONE) 25 MG tablet Take 0.5 tablets (12.5 mg total) by mouth daily. 06/30/19 09/28/19 Yes Rex Kras, DO    Physical Exam: Vitals:   07/29/19 1243 07/29/19 1244  BP: (!) 175/63   Pulse: (!) 50   Resp: 20   Temp: (!) 97.5 F (36.4 C)   TempSrc: Oral   SpO2: 98%   Weight:  78.8 kg  Height:  5\' 9"  (1.753 m)    Constitutional: NAD, calm, comfortable, communicating well, on room air Eyes: PERRL, lids and conjunctivae normal ENMT: Mucous membranes are moist. Posterior pharynx clear of any exudate or lesions.Normal dentition.  Neck: normal, supple, no masses, no thyromegaly Respiratory: clear to auscultation bilaterally, no wheezing, no crackles. Normal respiratory effort. No accessory muscle use.  Cardiovascular: Regular rate and rhythm, no murmurs / rubs / gallops. No extremity edema. 2+ pedal pulses. No carotid bruits.  Abdomen: no tenderness, no masses palpated. No hepatosplenomegaly. Bowel sounds positive.  Musculoskeletal: no clubbing / cyanosis. No joint deformity upper and lower extremities. Good ROM, no contractures. Normal muscle tone.  Skin: no rashes,  lesions, ulcers. No induration Neurologic: CN 2-12 grossly intact. Sensation intact, DTR normal.  Power: 4 out of 5 in right upper and lower extremity and 5 out of 5 in left upper and lower extremity.  Psychiatric: Normal judgment and insight. Alert and oriented x 3. Normal mood.    Labs on Admission: I have personally reviewed following labs and imaging studies  CBC: Recent Labs  Lab 07/29/19 1158 07/29/19 1206  WBC 4.6  --   NEUTROABS 2.0  --   HGB 14.6 14.3  HCT 44.5 42.0  MCV 98.5  --   PLT 233  --    Basic Metabolic Panel: Recent Labs  Lab 07/29/19 1158 07/29/19 1206  NA 141 141  K 3.6 3.5  CL 110 108  CO2 20*  --   GLUCOSE 105* 106*  BUN 5* 7*  CREATININE 1.21 1.30*  CALCIUM 9.1  --    GFR: Estimated Creatinine Clearance: 48.3 mL/min (A) (by C-G formula based on SCr of 1.3 mg/dL (H)). Liver Function Tests: Recent Labs  Lab 07/29/19 1158  AST 21  ALT 17  ALKPHOS 58  BILITOT 0.8  PROT 7.3  ALBUMIN 3.6   No results for input(s): LIPASE, AMYLASE in the last  168 hours. No results for input(s): AMMONIA in the last 168 hours. Coagulation Profile: Recent Labs  Lab 07/29/19 1158  INR 1.0   Cardiac Enzymes: No results for input(s): CKTOTAL, CKMB, CKMBINDEX, TROPONINI in the last 168 hours. BNP (last 3 results) No results for input(s): PROBNP in the last 8760 hours. HbA1C: No results for input(s): HGBA1C in the last 72 hours. CBG: Recent Labs  Lab 07/29/19 1157  GLUCAP 97   Lipid Profile: No results for input(s): CHOL, HDL, LDLCALC, TRIG, CHOLHDL, LDLDIRECT in the last 72 hours. Thyroid Function Tests: No results for input(s): TSH, T4TOTAL, FREET4, T3FREE, THYROIDAB in the last 72 hours. Anemia Panel: No results for input(s): VITAMINB12, FOLATE, FERRITIN, TIBC, IRON, RETICCTPCT in the last 72 hours. Urine analysis: No results found for: COLORURINE, APPEARANCEUR, LABSPEC, PHURINE, GLUCOSEU, HGBUR, BILIRUBINUR, KETONESUR, PROTEINUR, UROBILINOGEN,  NITRITE, LEUKOCYTESUR  Radiological Exams on Admission: CT Code Stroke CTA Head W/WO contrast  Result Date: 07/29/2019 CLINICAL DATA:  Stroke EXAM: CT ANGIOGRAPHY HEAD AND NECK TECHNIQUE: Multidetector CT imaging of the head and neck was performed using the standard protocol during bolus administration of intravenous contrast. Multiplanar CT image reconstructions and MIPs were obtained to evaluate the vascular anatomy. Carotid stenosis measurements (when applicable) are obtained utilizing NASCET criteria, using the distal internal carotid diameter as the denominator. CONTRAST:  2mL OMNIPAQUE IOHEXOL 350 MG/ML SOLN COMPARISON:  CT head immediately preceding the CTA. FINDINGS: CTA NECK FINDINGS Aortic arch: Atherosclerotic aortic arch. Four vessel branching of the arch with left vertebral artery origin from the arch. Atherosclerotic disease and mild stenosis proximal left subclavian artery. Right carotid system: Atherosclerotic disease right carotid bifurcation without significant stenosis. Beading of the right internal carotid artery compatible with fibromuscular dysplasia without aneurysm or dissection. Left carotid system: Left common carotid artery widely patent. Atherosclerotic disease left carotid bifurcation. Approximately 25% diameter stenosis proximal left internal carotid artery due to noncalcified plaque. Fibromuscular dysplasia in the left internal carotid artery without dissection or aneurysm. Vertebral arteries: Right vertebral artery is widely patent. Left vertebral artery origin from the aortic arch without significant stenosis. Skeleton: Cervical spondylosis without acute skeletal abnormality. Other neck: Negative for mass or adenopathy in the neck. Upper chest: No acute abnormality in the lung apices bilaterally. Review of the MIP images confirms the above findings CTA HEAD FINDINGS Anterior circulation: Atherosclerotic calcification in the cavernous carotid bilaterally. Mild to moderate  stenosis in the cavernous carotid bilaterally. Short segment occlusion left M2 branch most likely due to clot. Left M1 segment widely patent. Right middle cerebral artery patent without significant stenosis. Posterior circulation: Both vertebral arteries patent to the basilar. PICA patent bilaterally. Basilar widely patent. Superior cerebellar and posterior cerebral arteries patent bilaterally without stenosis. Venous sinuses: Normal venous enhancement Anatomic variants: None Review of the MIP images confirms the above findings IMPRESSION: 1. Short segment occlusion left M2 branch consistent with acute thrombus. 2. Mild to moderate atherosclerotic stenosis in the cavernous carotid bilaterally. 3. Mild atherosclerotic disease carotid bifurcation bilaterally without significant stenosis. 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 4. Both vertebral arteries widely patent without stenosis. 5. These results were called by telephone at the time of interpretation on 07/29/2019 at 12:33 pm to provider MCNEILL [facility] , who verbally acknowledged these results. Electronically Signed   By: Franchot Gallo M.D.   On: 07/29/2019 12:33   CT Code Stroke CTA Neck W/WO contrast  Result Date: 07/29/2019 CLINICAL DATA:  Stroke EXAM: CT ANGIOGRAPHY HEAD AND NECK TECHNIQUE: Multidetector CT imaging of  the head and neck was performed using the standard protocol during bolus administration of intravenous contrast. Multiplanar CT image reconstructions and MIPs were obtained to evaluate the vascular anatomy. Carotid stenosis measurements (when applicable) are obtained utilizing NASCET criteria, using the distal internal carotid diameter as the denominator. CONTRAST:  62mL OMNIPAQUE IOHEXOL 350 MG/ML SOLN COMPARISON:  CT head immediately preceding the CTA. FINDINGS: CTA NECK FINDINGS Aortic arch: Atherosclerotic aortic arch. Four vessel branching of the arch with left vertebral artery origin from the arch.  Atherosclerotic disease and mild stenosis proximal left subclavian artery. Right carotid system: Atherosclerotic disease right carotid bifurcation without significant stenosis. Beading of the right internal carotid artery compatible with fibromuscular dysplasia without aneurysm or dissection. Left carotid system: Left common carotid artery widely patent. Atherosclerotic disease left carotid bifurcation. Approximately 25% diameter stenosis proximal left internal carotid artery due to noncalcified plaque. Fibromuscular dysplasia in the left internal carotid artery without dissection or aneurysm. Vertebral arteries: Right vertebral artery is widely patent. Left vertebral artery origin from the aortic arch without significant stenosis. Skeleton: Cervical spondylosis without acute skeletal abnormality. Other neck: Negative for mass or adenopathy in the neck. Upper chest: No acute abnormality in the lung apices bilaterally. Review of the MIP images confirms the above findings CTA HEAD FINDINGS Anterior circulation: Atherosclerotic calcification in the cavernous carotid bilaterally. Mild to moderate stenosis in the cavernous carotid bilaterally. Short segment occlusion left M2 branch most likely due to clot. Left M1 segment widely patent. Right middle cerebral artery patent without significant stenosis. Posterior circulation: Both vertebral arteries patent to the basilar. PICA patent bilaterally. Basilar widely patent. Superior cerebellar and posterior cerebral arteries patent bilaterally without stenosis. Venous sinuses: Normal venous enhancement Anatomic variants: None Review of the MIP images confirms the above findings IMPRESSION: 1. Short segment occlusion left M2 branch consistent with acute thrombus. 2. Mild to moderate atherosclerotic stenosis in the cavernous carotid bilaterally. 3. Mild atherosclerotic disease carotid bifurcation bilaterally without significant stenosis. Fibromuscular dysplasia of the internal  carotid artery bilaterally. 4. Both vertebral arteries widely patent without stenosis. 5. These results were called by telephone at the time of interpretation on 07/29/2019 at 12:33 pm to provider MCNEILL [facility] , who verbally acknowledged these results. Electronically Signed   By: Franchot Gallo M.D.   On: 07/29/2019 12:33   CT CEREBRAL PERFUSION W CONTRAST  Result Date: 07/29/2019 CLINICAL DATA:  Stroke EXAM: CT PERFUSION BRAIN TECHNIQUE: Multiphase CT imaging of the brain was performed following IV bolus contrast injection. Subsequent parametric perfusion maps were calculated using RAPID software. CONTRAST:  50 mL Isovue 370 IV COMPARISON:  CT a head and neck 07/29/2019 FINDINGS: CT Brain Perfusion Findings: CBF (<30%) Volume: 49mL Perfusion (Tmax>6.0s) volume: 46mL Mismatch Volume: 74mL ASPECTS on noncontrast CT Head: 9 at 12:04 p.m. today. Infarct Core: 0 mL Infarction Location:None IMPRESSION: CT perfusion negative for acute infarct or ischemia CT head earlier today reveals hypodensity in the left temporal lobe consistent with acute infarct. These results were called by telephone at the time of interpretation on 07/29/2019 at 12:45 pm to provider MCNEILL [facility] , who verbally acknowledged these results. Electronically Signed   By: Franchot Gallo M.D.   On: 07/29/2019 12:55   CT HEAD CODE STROKE WO CONTRAST  Result Date: 07/29/2019 CLINICAL DATA:  Code stroke. Right-sided weakness and right facial droop. 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 EXAM: CT HEAD WITHOUT CONTRAST TECHNIQUE: Contiguous axial images were obtained from the base of the skull through the vertex without intravenous contrast. COMPARISON:  None. FINDINGS:  Brain: Hypodensity in the left temporal lobe compatible with acute infarct. No associated hemorrhage. Mild atrophy and mild chronic microvascular ischemic changes in the white matter. Negative for mass lesion. Vascular: Negative for hyperdense vessel Skull: 1 cm lucency in the right  parietal bone over the convexity. No other skeletal lesion identified. Sinuses/Orbits: Mild mucosal edema paranasal sinuses. Normal orbit bilaterally. Other: None ASPECTS ([place] Stroke Program Early CT Score) - Ganglionic level infarction (caudate, lentiform nuclei, internal capsule, insula, M1-M3 cortex): 6 - Supraganglionic infarction (M4-M6 cortex): 3 Total score (0-10 with 10 being normal): 9 IMPRESSION: 1. Acute infarct left temporal lobe without hemorrhage. 2. ASPECTS is 9 3. Results texted to Dr. Leonel Ramsay Electronically Signed   By: Franchot Gallo M.D.   On: 07/29/2019 12:20   Assessment/Plan Principal Problem:   CVA (cerebral vascular accident) ([place]) Active Problems:   Coronary artery disease   HTN (hypertension)   Hyperlipemia   Ischemic cardiomyopathy   PAD (peripheral artery disease) ([facility])   Bradycardia   AKI (acute kidney injury) ([place])    Acute infarction of left temporal lobe: -Patient presented with right-sided weakness-risk factors including hypertension, hyperlipidemia, tobacco abuse. -Reviewed CT head, CT angiogram of head and neck and cerebral perfusion study. -admit forTelemetry monitoring.  We will get EKG. -Stroke protocol -Allow for permissive hypertension for the first 24-48h - only treat PRN if SBP 123456 mmHg or diastolic blood pressure 123XX123. Blood pressures can be gradually normalized to SBP<140 upon discharge. -Loading dose of ASA and Plavix given. -Ordered MRI & MRA of brain, Doppler ultrasound of carotid, lipid panel, A1c and transthoracic echocardiogram to- rule out PFO -Frequent neuro checks -Risk factor modification -Consult Neurology-appreciate help -Keep him n.p.o. until he passes the bedside swallow evaluation -PT/OT eval, Speech consult  Hypertension: -Hold Coreg, hydralazine, Entresto and Aldactone for now to allow permissive hypertension.  Monitor blood pressure closely.  Ischemic cardiomyopathy/chronic congestive heart failure with reduced  ejection fraction: Patient is euvolemic.   -(Reviewed outpatient cardiology notes) -On aspirin, statin, Coreg, Entresto and Aldactone -Continue aspirin, statin.  Hold Coreg, Entresto and Aldactone to allow permissive hypertension. -Strict INO's and daily weight.  Check electrolytes. -Monitor signs for fluid overload.  Hyperlipidemia: Check lipid panel -Continue Zetia and statin  AKI: Mild -Hold nephrotoxic medication.  Repeat BMP tomorrow a.m.  Carotid artery atherosclerosis/PAD: -Continue Zetia, aspirin, statin  Tobacco abuse: -Refused nicotine patch. -Counseled about cessation  Asymptomatic bradycardia: On telemetry -Could be due to Coreg -Continue to monitor  DVT prophylaxis: Lovenox/SCD/TED Code Status: Full code-confirmed with the patient Family Communication: Patient's daughter present at bedside.  Plan of care discussed with patient and his daughter at bedside in length and they verbalized understanding and agreed with it. Disposition Plan: Likely home in 1 to 2 days Consults called: Neurology by EDP Admission status: Inpatient   Mckinley Jewel MD [facility] Pager [phone]  If 7PM-7AM, please contact night-coverage [email or web address] Password [facility]  07/29/2019, 1:06 PM

## 2019-07-29 NOTE — ED Notes (Signed)
Pt remains in MRI at this time  

## 2019-07-29 NOTE — ED Notes (Signed)
Pt returned from MRI.  Daughter at bedside

## 2019-07-29 NOTE — Consult Note (Signed)
Neurology Consultation Reason for Consult: Right-sided weakness  Referring Physician: Jeanell Sparrow, D  CC: Right-sided weakness  History is obtained from: Patient  HPI: Shawn Schmidt is a 79 y.o. male with a history of CHF, carotid stenosis, hyperlipidemia, hypertension who presents with right-sided weakness that was present on awakening this morning.  He went to bed last night his normal state of health, and on awakening he stated that he "could not move at all".  Since that time, he has been able to get up and walk enough to have his wife bring him into the emergency department.  In the emergency department, the ER recognized potential LVO and a code stroke was activated.  He was brought in for a CT angiogram which reveals a?  Nonocclusive thrombus in the proximal M2 branch on the left.  LKW: 4/14 prior to bed tpa given?: no, outside of window    ROS: A 14 point ROS was performed and is negative except as noted in the HPI.   Past Medical History:  Diagnosis Date  . Carotid artery occlusion   . CHF (congestive heart failure) ([place])   . Coronary artery disease   . HTN (hypertension) 11/27/2018  . Hyperlipemia 11/27/2018  . Hyperlipidemia   . Hypertension   . Ischemic cardiomyopathy   . Peripheral artery disease ([facility])      Family History  Problem Relation Age of Onset  . Hypertension Mother   . Hypertension Father      Social History:  reports that he has been smoking cigarettes. He has been smoking about 1.00 pack per day. He has never used smokeless tobacco. He reports current alcohol use. He reports that he does not use drugs.   Exam: Current vital signs: Vitals:   07/29/19 1300 07/29/19 1315  BP: (!) 181/62 (!) 168/69  Pulse: (!) 49 (!) 46  Resp: 17 18  Temp:    SpO2: 99% 98%     Vital signs in last 24 hours:     Physical Exam  Constitutional: Appears well-developed and well-nourished.  Psych: Affect appropriate to situation Eyes: No scleral injection HENT: No  OP obstrucion MSK: no joint deformities.  Cardiovascular: Normal rate and regular rhythm.  Respiratory: Effort normal, non-labored breathing GI: Soft.  No distension. There is no tenderness.  Skin: WDI  Neuro: Mental Status: Patient is awake, alert, oriented to person, place, month, year, and situation. Patient is able to give a clear and coherent history. No signs of aphasia or neglect Cranial Nerves: II: Visual Fields are full. Pupils are equal, round, and reactive to light.   III,IV, VI: EOMI without ptosis or diploplia.  V: Facial sensation is diminished on the right VII: Facial movement with mild weakness on the right he is dysarthric VIII: hearing is intact to voice X: Uvula elevates symmetrically XI: Shoulder shrug is symmetric. XII: tongue is midline without atrophy or fasciculations.  Motor: Tone is normal. Bulk is normal. 5/5 strength was present on the left, he has 4-/5 weakness of the right arm, though he is able to hold it up off the bed for 10 seconds.  4/5 weakness of the right leg Sensory: Sensation is diminished on the right Cerebellar: FNF and HKS are consistent with weakness on the right, intact on the left    I have reviewed labs in epic and the results pertinent to this consultation are: Creatinine 1.3 by Chem-8  I have reviewed the images obtained: CT/CTA-summer infarct on the left with a partially occluded left M2.  Impression: 79 yo M with L M2 ischemic stroke. He has a relativly low NIH and CTP without perfusion deficit. At this point, would not proceed with IR, but if he were to worsen, could consider it at that point. Unclear source at this time.   Recommendations: - HgbA1c, fasting lipid panel - MRI, MRA  of the brain without contrast - Frequent neuro checks - Echocardiogram - Carotid dopplers - Prophylactic therapy-Antiplatelet med: Aspirin - dose 325mg  PO or 300mg  PR, plavix 75mg  daily following 300mg  load.  - Risk factor modification -  Telemetry monitoring - PT consult, OT consult, Speech consult - Stroke team to follow   Roland Rack, MD [facility] [phone]  If 7pm- 7am, please page neurology on call as listed in [place].

## 2019-07-29 NOTE — Progress Notes (Signed)
  Echocardiogram 2D Echocardiogram has been performed.  Kaleeah Gingerich G Pamalee Marcoe 07/29/2019, 3:05 PM

## 2019-07-29 NOTE — ED Triage Notes (Signed)
Patient presents to ed POV with his daughter states he went to bed last pm at 9 woke up this am at 6am and his legs were weak, his daughter thought his speech was different. Upon arrival alert oriented c/o weakness in right leg. Speech clear however slow

## 2019-07-29 NOTE — ED Notes (Signed)
Transported to MRI

## 2019-07-30 ENCOUNTER — Inpatient Hospital Stay (HOSPITAL_COMMUNITY): Payer: PPO

## 2019-07-30 DIAGNOSIS — I6389 Other cerebral infarction: Secondary | ICD-10-CM

## 2019-07-30 DIAGNOSIS — I639 Cerebral infarction, unspecified: Secondary | ICD-10-CM

## 2019-07-30 LAB — BASIC METABOLIC PANEL
Anion gap: 12 (ref 5–15)
BUN: 8 mg/dL (ref 8–23)
CO2: 20 mmol/L — ABNORMAL LOW (ref 22–32)
Calcium: 8.9 mg/dL (ref 8.9–10.3)
Chloride: 109 mmol/L (ref 98–111)
Creatinine, Ser: 1.29 mg/dL — ABNORMAL HIGH (ref 0.61–1.24)
GFR calc Af Amer: >60 mL/min (ref >60)
GFR calc non Af Amer: 53 mL/min — ABNORMAL LOW (ref >60)
Glucose, Bld: 68 mg/dL — ABNORMAL LOW (ref 70–99)
Potassium: 3.5 mmol/L (ref 3.5–5.1)
Sodium: 141 mmol/L (ref 135–145)

## 2019-07-30 LAB — CBC WITH DIFFERENTIAL/PLATELET
Abs Immature Granulocytes: 0.01 10*3/uL (ref 0.00–0.07)
Basophils Absolute: 0 10*3/uL (ref 0.0–0.1)
Basophils Relative: 1 %
Eosinophils Absolute: 0 10*3/uL (ref 0.0–0.5)
Eosinophils Relative: 1 %
HCT: 42.9 % (ref 39.0–52.0)
Hemoglobin: 13.9 g/dL (ref 13.0–17.0)
Immature Granulocytes: 0 %
Lymphocytes Relative: 39 %
Lymphs Abs: 1.8 10*3/uL (ref 0.7–4.0)
MCH: 32 pg (ref 26.0–34.0)
MCHC: 32.4 g/dL (ref 30.0–36.0)
MCV: 98.8 fL (ref 80.0–100.0)
Monocytes Absolute: 0.5 10*3/uL (ref 0.1–1.0)
Monocytes Relative: 10 %
Neutro Abs: 2.3 10*3/uL (ref 1.7–7.7)
Neutrophils Relative %: 49 %
Platelets: 212 10*3/uL (ref 150–400)
RBC: 4.34 MIL/uL (ref 4.22–5.81)
RDW: 13.6 % (ref 11.5–15.5)
WBC: 4.6 10*3/uL (ref 4.0–10.5)
nRBC: 0 % (ref 0.0–0.2)

## 2019-07-30 LAB — MAGNESIUM: Magnesium: 2 mg/dL (ref 1.7–2.4)

## 2019-07-30 LAB — ECHOCARDIOGRAM LIMITED
Height: 69 in
Weight: 2780.8 oz

## 2019-07-30 LAB — LIPID PANEL
Cholesterol: 114 mg/dL (ref 0–200)
HDL: 41 mg/dL (ref >40)
LDL Cholesterol: 51 mg/dL (ref 0–99)
Total CHOL/HDL Ratio: 2.8 RATIO
Triglycerides: 111 mg/dL (ref <150)
VLDL: 22 mg/dL (ref 0–40)

## 2019-07-30 LAB — CBG MONITORING, ED
Glucose-Capillary: 58 mg/dL — ABNORMAL LOW (ref 70–99)
Glucose-Capillary: 105 mg/dL — ABNORMAL HIGH (ref 70–99)

## 2019-07-30 LAB — HEMOGLOBIN A1C
Hgb A1c MFr Bld: 6.2 % — ABNORMAL HIGH (ref 4.8–5.6)
Mean Plasma Glucose: 131.24 mg/dL

## 2019-07-30 MED ORDER — PERFLUTREN LIPID MICROSPHERE
1.0000 mL | INTRAVENOUS | Status: AC | PRN
  Administered 2019-07-30: 2 mL via INTRAVENOUS
  Filled 2019-07-30: qty 10

## 2019-07-30 NOTE — Progress Notes (Signed)
New Admission Note:  Arrival Method: arrived via stretcher from [facility] ED Mental Orientation: pt is alert and oriented x4 Telemetry: yes, NSR on monitor Assessment: Completed Skin: skin is dry and intact IV: 20 G left AC, SL Pain: pt c/o 5/10 pain in head and tylenol administered Safety Measures: Safety Fall Prevention Plan was given, discussed. Admission: Completed 3W: Patient has been orientated to the room, unit and the staff. Family: pt has daughter and son at bedside. They both are pleasant and supportive and up to date with pt plan of care.   Orders have been reviewed and implemented. Will continue to monitor the patient. Call light has been placed within reach and bed alarm has been activated.   Janus Molder, RN

## 2019-07-30 NOTE — Consult Note (Addendum)
ELECTROPHYSIOLOGY CONSULT NOTE  Patient ID: SA PISARSKI MRN: [id number], DOB/AGE: 1940/12/07   Admit date: 07/29/2019 Date of Consult: 07/30/2019  Primary Physician: [name], MD Primary Cardiologist: Dr. Einar Gip Reason for Consultation: Cryptogenic stroke ; recommendations regarding Implantable Loop Recorder, requested by Dr. Einar Gip  History of Present Illness Shawn Schmidt was admitted on 07/29/2019 with R sided weakness and stroke.  He first developed symptoms upon waking.    PMHx includes: PVD (b/l LE interventions), CAD (2015 PCI to the mid and proximal LAD by placement of 2 overlapping mid LAD stents), HTN, HLD, ICM  he has undergone workup for stroke including echocardiogram and carotid dopplers.  The patient has been monitored on telemetry which has demonstrated sinus rhythm with no arrhythmias.    I have discussed the case with neuro NA, no note yet in the system.  They would like him evaluated for loop, no need for TEE given his age.   Echocardiogram this admission demonstrated 07/29/2019   IMPRESSIONS   1. No intracardiac source of embolism was identified, however additional  images with Definity echo contrast should be performed as there is apical  akinesis and an apical thrombus can't be excluded.   2. Left ventricular ejection fraction, by estimation, is 35 to 40%. The  left ventricle has moderately decreased function. The left ventricle  demonstrates regional wall motion abnormalities (see scoring  diagram/findings for description). Left ventricular   diastolic parameters are consistent with Grade I diastolic dysfunction  (impaired relaxation). Elevated left atrial pressure.   3. Right ventricular systolic function is normal. The right ventricular  size is normal. There is normal pulmonary artery systolic pressure. The  estimated right ventricular systolic pressure is [id number].   4. Left atrial size was mildly dilated.   5. The mitral valve is normal in  structure. Mild mitral valve  regurgitation. No evidence of mitral stenosis.   6. The aortic valve is normal in structure. Aortic valve regurgitation is  not visualized. No aortic stenosis is present.   7. The inferior vena cava is normal in size with greater than 50%  respiratory variability, suggesting right atrial pressure of 3 mmHg.   07/30/19 IMPRESSIONS   1. Limited f/u study with definity Moderate LVE with septal and apical  akinesis EF 40-45% no mural apical thrombus.   FINDINGS   Left Ventricle: Definity contrast agent was given IV to delineate the  left ventricular endocardial borders.   Additional Comments: Limited f/u study with definity Moderate LVE with  septal and apical akinesis EF 40-45% no mural apical thrombus.    Lab work is reviewed.  Prior to admission, the patient denies chest pain, shortness of breath, dizziness, palpitations, or syncope.  They are recovering from their stroke with plans to home at discharge.  EP has been asked to evaluate for placement of an implantable loop recorder to monitor for atrial fibrillation.     Past Medical History:  Diagnosis Date   Carotid artery occlusion    CHF (congestive heart failure) (HCC)    Coronary artery disease    HTN (hypertension) 11/27/2018   Hyperlipemia 11/27/2018   Hyperlipidemia    Hypertension    Ischemic cardiomyopathy    Peripheral artery disease [facility])      Surgical History:  Past Surgical History:  Procedure Laterality Date   CARDIAC CATHETERIZATION N/A 02/21/2015   Procedure: Left Heart Cath and Coronary Angiography;  Surgeon: [name], MD;  Location: [place] CV LAB;  Service:  Cardiovascular;  Laterality: N/A;   CORONARY ANGIOPLASTY     PERIPHERAL VASCULAR CATHETERIZATION Bilateral 04/25/2015   Procedure: Lower Extremity Angiography;  Surgeon: [name], MD;  Location: [place] [facility];  Service: Cardiovascular;  Laterality: Bilateral;   PERIPHERAL VASCULAR CATHETERIZATION Left 05/30/2015    Procedure: Peripheral Vascular Intervention;  Surgeon: [name], MD;  Location: [place] CV LAB;  Service: Cardiovascular;  Laterality: Left;  POPLITEAL   PERIPHERAL VASCULAR CATHETERIZATION Left 05/30/2015   Procedure: Peripheral Vascular Intervention;  Surgeon: [name], MD;  Location: [place] [facility];  Service: Cardiovascular;  Laterality: Left;  POPLITEAL     Medications Prior to Admission  Medication Sig Dispense Refill Last Dose   acetaminophen (TYLENOL) 500 MG tablet Take 1,000 mg by mouth every 6 (six) hours as needed for mild pain or headache.   unk   aspirin EC 81 MG tablet Take 1 tablet by mouth daily.   07/28/2019 at Unknown time   carvedilol (COREG) 3.125 MG tablet Take 1 tablet by mouth twice daily 180 tablet 1 07/29/2019 at 0700   CYANOCOBALAMIN PO Take 1,000 mcg by mouth daily. sublingual   07/29/2019 at Unknown time   ezetimibe (ZETIA) 10 MG tablet Take 1 tablet (10 mg total) by mouth daily. 90 tablet 3 07/29/2019 at Unknown time   ferrous sulfate 325 (65 FE) MG tablet Take 325 mg by mouth daily with breakfast.   07/29/2019 at Unknown time   hydrALAZINE (APRESOLINE) 10 MG tablet Take 1 tablet (10 mg total) by mouth 3 (three) times daily. 90 tablet 1 07/29/2019 at Unknown time   Multiple Vitamin (MULTIVITAMIN) capsule Take 1 capsule by mouth daily.   07/28/2019 at Unknown time   nitroGLYCERIN (NITROSTAT) 0.4 MG SL tablet Place 0.4 mg under the tongue every 5 (five) minutes as needed for chest pain.    unk   rosuvastatin (CRESTOR) 20 MG tablet Take 1 tablet by mouth once daily 90 tablet 1 07/28/2019 at Unknown time   sacubitril-valsartan (ENTRESTO) 49-51 MG Take 1 tablet by mouth 2 (two) times daily. (Patient taking differently: Take 1 tablet by mouth daily. Could not tolerate bid) 60 tablet 3 07/28/2019 at Unknown time   spironolactone (ALDACTONE) 25 MG tablet Take 0.5 tablets (12.5 mg total) by mouth daily. 15 tablet 2 07/29/2019 at Unknown time    Inpatient Medications:     stroke: mapping our early stages of recovery book   Does not apply Once   aspirin EC  325 mg Oral Daily   clopidogrel  75 mg Oral Daily   enoxaparin (LOVENOX) injection  40 mg Subcutaneous Q24H   ezetimibe  10 mg Oral Daily   ferrous sulfate  325 mg Oral Q breakfast   multivitamin with minerals  1 tablet Oral Daily   rosuvastatin  20 mg Oral Daily    Allergies:  Allergies  Allergen Reactions   Lisinopril Other (See Comments)    Hyper activity   Other     Halothane///Anesthesia - test was done to show allergy - Son had very bad experience- life threatening     Social History   Socioeconomic History   Marital status: Widowed    Spouse name: Not on file   Number of children: 2   Years of education: Not on file   Highest education level: Not on file  Occupational History   Not on file  Tobacco Use   Smoking status: Current Every Day Smoker    Packs/day: 1.00    Types:  Cigarettes   Smokeless tobacco: Never Used  Substance and Sexual Activity   Alcohol use: Yes    Comment: occ   Drug use: Never   Sexual activity: Not on file  Other Topics Concern   Not on file  Social History Narrative   Not on file   Social Determinants of Health   Financial Resource Strain:    Difficulty of Paying Living Expenses:   Food Insecurity:    Worried About Charity fundraiser in the Last Year:    Arboriculturist in the Last Year:   Transportation Needs:    Film/video editor (Medical):    Lack of Transportation (Non-Medical):   Physical Activity:    Days of Exercise per Week:    Minutes of Exercise per Session:   Stress:    Feeling of Stress :   Social Connections:    Frequency of Communication with Friends and Family:    Frequency of Social Gatherings with Friends and Family:    Attends Religious Services:    Active Member of Clubs or Organizations:    Attends Music therapist:    Marital Status:   Intimate Partner Violence:    Fear of Current or Ex-Partner:      Emotionally Abused:    Physically Abused:    Sexually Abused:      Family History  Problem Relation Age of Onset   Hypertension Mother    Hypertension Father       Review of Systems: All other systems reviewed and are otherwise negative except as noted above.  Physical Exam: Vitals:   07/30/19 0700 07/30/19 0800 07/30/19 0900 07/30/19 1215  BP: (!) 168/68 (!) 158/59 (!) 152/68 (!) 200/79  Pulse: (!) 57 61 (!) 57 (!) 53  Resp: 16 17 18 18   Temp:      TempSrc:      SpO2: 97% 96% 97% 97%  Weight:      Height:        GEN- The patient is well appearing, alert and oriented x 3 today.   Head- normocephalic, atraumatic Eyes-  Sclera clear, conjunctiva pink Ears- hearing intact Oropharynx- clear Neck- supple Lungs- CTA b/l, normal work of breathing Heart- Regular rate and rhythm, soft SM, no rubs or gallops  GI- soft, NT, ND Extremities- no clubbing, cyanosis, or edema MS- no significant deformity or atrophy Skin- no rash or lesion Psych- euthymic mood, full affect   Labs:   Lab Results  Component Value Date   WBC 4.6 07/30/2019   HGB 13.9 07/30/2019   HCT 42.9 07/30/2019   MCV 98.8 07/30/2019   PLT 212 07/30/2019    Recent Labs  Lab 07/29/19 1158 07/29/19 1206 07/30/19 0551  NA 141   < > 141  K 3.6   < > 3.5  CL 110   < > 109  CO2 20*   < > 20*  BUN 5*   < > 8  CREATININE 1.21   < > 1.29*  CALCIUM 9.1   < > 8.9  PROT 7.3  --   --   BILITOT 0.8  --   --   ALKPHOS 58  --   --   ALT 17  --   --   AST 21  --   --   GLUCOSE 105*   < > 68*   < > = values in this interval not displayed.   No results found for: CKTOTAL, CKMB, CKMBINDEX, TROPONINI Lab  Results  Component Value Date   CHOL 114 07/30/2019   CHOL 163 06/09/2019   Lab Results  Component Value Date   HDL 41 07/30/2019   HDL 65 06/09/2019   Lab Results  Component Value Date   LDLCALC 51 07/30/2019   LDLCALC 84 06/09/2019   Lab Results  Component Value Date   TRIG 111 07/30/2019    TRIG 75 06/09/2019   Lab Results  Component Value Date   CHOLHDL 2.8 07/30/2019   CHOLHDL 2.5 06/09/2019   No results found for: LDLDIRECT  No results found for: DDIMER   Radiology/Studies:   CT Code Stroke CTA Head W/WO contrast Result Date: 07/29/2019 CLINICAL DATA:  Stroke EXAM: CT ANGIOGRAPHY HEAD AND NECK TECHNIQUE: Multidetector CT imaging of the head and neck was performed using the standard protocol during bolus administration of intravenous contrast. Multiplanar CT image reconstructions and MIPs were obtained to evaluate the vascular anatomy. Carotid stenosis measurements (when applicable) are obtained utilizing NASCET criteria, using the distal internal carotid diameter as the denominator. CONTRAST:  80mL OMNIPAQUE IOHEXOL 350 MG/ML SOLN COMPARISON:  CT head immediately preceding the CTA. FINDINGS: CTA NECK FINDINGS Aortic arch: Atherosclerotic aortic arch. Four vessel branching of the arch with left vertebral artery origin from the arch. Atherosclerotic disease and mild stenosis proximal left subclavian artery. Right carotid system: Atherosclerotic disease right carotid bifurcation without significant stenosis. Beading of the right internal carotid artery compatible with fibromuscular dysplasia without aneurysm or dissection. Left carotid system: Left common carotid artery widely patent. Atherosclerotic disease left carotid bifurcation. Approximately 25% diameter stenosis proximal left internal carotid artery due to noncalcified plaque. Fibromuscular dysplasia in the left internal carotid artery without dissection or aneurysm. Vertebral arteries: Right vertebral artery is widely patent. Left vertebral artery origin from the aortic arch without significant stenosis. Skeleton: Cervical spondylosis without acute skeletal abnormality. Other neck: Negative for mass or adenopathy in the neck. Upper chest: No acute abnormality in the lung apices bilaterally. Review of the MIP images confirms the  above findings CTA HEAD FINDINGS Anterior circulation: Atherosclerotic calcification in the cavernous carotid bilaterally. Mild to moderate stenosis in the cavernous carotid bilaterally. Short segment occlusion left M2 branch most likely due to clot. Left M1 segment widely patent. Right middle cerebral artery patent without significant stenosis. Posterior circulation: Both vertebral arteries patent to the basilar. PICA patent bilaterally. Basilar widely patent. Superior cerebellar and posterior cerebral arteries patent bilaterally without stenosis. Venous sinuses: Normal venous enhancement Anatomic variants: None Review of the MIP images confirms the above findings IMPRESSION: 1. Short segment occlusion left M2 branch consistent with acute thrombus. 2. Mild to moderate atherosclerotic stenosis in the cavernous carotid bilaterally. 3. Mild atherosclerotic disease carotid bifurcation bilaterally without significant stenosis. 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 4. Both vertebral arteries widely patent without stenosis. 5. These results were called by telephone at the time of interpretation on 07/29/2019 at 12:33 pm to provider [facility] , who verbally acknowledged these results. Electronically Signed   By: Franchot Gallo M.D.   On: 07/29/2019 12:33     MR ANGIO HEAD WO CONTRAST Result Date: 07/29/2019 CLINICAL DATA:  Stroke, follow-up EXAM: MRI HEAD WITHOUT CONTRAST MRA HEAD WITHOUT CONTRAST TECHNIQUE: Multiplanar, multiecho pulse sequences of the brain and surrounding structures were obtained without intravenous contrast. Angiographic images of the head were obtained using MRA technique without contrast. COMPARISON:  None. FINDINGS: MRI HEAD Brain: There is cortical/subcortical restricted diffusion involving the posterior left insula and superior left temporal  lobe. Additional restricted diffusion is noted spanning the posterior left lentiform nucleus to the posterior body  of the caudate with involvement of intervening white matter. There is a small area of associated susceptibility in the superior left temporal lobe reflecting petechial hemorrhage. There is no intracranial mass or significant mass effect. Additional minimal patchy T2 hyperintensity in the supratentorial white matter is nonspecific but may reflect minor chronic microvascular ischemic changes. There is no hydrocephalus or extra-axial fluid collection. Vascular: Major vessel flow voids at the skull base are preserved. Skull and upper cervical spine: No suspicious osseous lesion. Sinuses/Orbits: Trace mucosal thickening.  Orbits are unremarkable. Other: Sella is unremarkable.  Mastoid air cells are clear. MRA HEAD Intracranial internal carotid arteries are patent with atherosclerotic irregularity. There is persistent nonocclusive thrombus within a proximal left M2 MCA branch. Right middle and both anterior cerebral arteries are patent. Intracranial vertebral arteries, basilar artery, posterior cerebral arteries are patent. There is no aneurysm. IMPRESSION: Acute left MCA territory infarcts involving the superior temporal lobe, posterior insula, and basal ganglia and adjacent white matter. Small petechial hemorrhage is present. Persistent nonocclusive thrombus within a proximal left M2 MCA branch. Electronically Signed   By: Macy Mis M.D.   On: 07/29/2019 16:05      CT HEAD CODE STROKE WO CONTRAST Result Date: 07/29/2019 CLINICAL DATA:  Code stroke. Right-sided weakness and right facial droop. 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 EXAM: CT HEAD WITHOUT CONTRAST TECHNIQUE: Contiguous axial images were obtained from the base of the skull through the vertex without intravenous contrast. COMPARISON:  None. FINDINGS: Brain: Hypodensity in the left temporal lobe compatible with acute infarct. No associated hemorrhage. Mild atrophy and mild chronic microvascular ischemic changes in the white matter. Negative for mass lesion.  Vascular: Negative for hyperdense vessel Skull: 1 cm lucency in the right parietal bone over the convexity. No other skeletal lesion identified. Sinuses/Orbits: Mild mucosal edema paranasal sinuses. Normal orbit bilaterally. Other: None ASPECTS ([place] Stroke Program Early CT Score) - Ganglionic level infarction (caudate, lentiform nuclei, internal capsule, insula, M1-M3 cortex): 6 - Supraganglionic infarction (M4-M6 cortex): 3 Total score (0-10 with 10 being normal): 9 IMPRESSION: 1. Acute infarct left temporal lobe without hemorrhage. 2. ASPECTS is 9 3. Results texted to Dr. Leonel Ramsay Electronically Signed   By: Franchot Gallo M.D.   On: 07/29/2019 12:20     VAS US CAROTID Result Date: 07/30/2019 Carotid Arterial Duplex Study Indications:       Carotid artery disease. Other Factors:     CTA 07/29/19 fibromuscular displasia bilateral ICAs. Right no                    stenosis. Left 25% narrowing. Comparison Study:  06/10/19 right 16-49% left 50-69% (at outside facility) Performing Technologist: June Leap RDMS, RVT  Examination Guidelines: A complete evaluation includes B-mode imaging, spectral Doppler, color Doppler, and power Doppler as needed of all accessible portions of each vessel. Bilateral testing is considered an integral part of a complete examination. Limited examinations for reoccurring indications may be performed as noted.  Right Carotid Findings: +----------+--------+--------+--------+----------------------+--------+           PSV cm/sEDV cm/sStenosisPlaque Description    Comments +----------+--------+--------+--------+----------------------+--------+ CCA Prox  81      10                                             +----------+--------+--------+--------+----------------------+--------+  CCA Distal73      12                                             +----------+--------+--------+--------+----------------------+--------+ ICA Prox  133     29      1-39%   homogeneous  and smooth         +----------+--------+--------+--------+----------------------+--------+ ICA Mid   111     21                                             +----------+--------+--------+--------+----------------------+--------+ ICA Distal138     29                                             +----------+--------+--------+--------+----------------------+--------+ ECA       163     17                                             +----------+--------+--------+--------+----------------------+--------+ +----------+--------+-------+----------------+-------------------+           PSV cm/sEDV cmsDescribe        Arm Pressure (mmHG) +----------+--------+-------+----------------+-------------------+ [id number]            Multiphasic, WNL                    +----------+--------+-------+----------------+-------------------+ +---------+--------+--+--------+--+---------+ VertebralPSV cm/s86EDV cm/s19Antegrade +---------+--------+--+--------+--+---------+  Left Carotid Findings: +----------+--------+--------+--------+------------------+---------------------+           PSV cm/sEDV cm/sStenosisPlaque DescriptionComments              +----------+--------+--------+--------+------------------+---------------------+ CCA Prox  121     17                                                      +----------+--------+--------+--------+------------------+---------------------+ CCA Distal64      14                                                      +----------+--------+--------+--------+------------------+---------------------+ ICA Prox  99      22      1-39%                                           +----------+--------+--------+--------+------------------+---------------------+ ICA Mid   140     37                                elevated velocities  mid ICA                +----------+--------+--------+--------+------------------+---------------------+ ICA Distal200     42                                                      +----------+--------+--------+--------+------------------+---------------------+ ECA       119     13                                                      +----------+--------+--------+--------+------------------+---------------------+ +----------+--------+--------+----------------+-------------------+           PSV cm/sEDV cm/sDescribe        Arm Pressure (mmHG) +----------+--------+--------+----------------+-------------------+ [id number]             Multiphasic, WNL                    +----------+--------+--------+----------------+-------------------+ +---------+--------+--+--------+--+---------+ VertebralPSV cm/s85EDV cm/s23Antegrade +---------+--------+--+--------+--+---------+   Summary: Right Carotid: Velocities in the right ICA are consistent with a 1-39% stenosis. Left Carotid: Velocities in the left ICA are consistent with a 1-39% stenosis.               Elevated velocities noted in the mid ICA.  *See table(s) above for measurements and observations.     Preliminary      12-lead ECG SB/SR, I do not see an EKG from this admission All prior EKG's in EPIC reviewed with no documented atrial fibrillation  Telemetry SB 50's  Assessment and Plan:  1. Cryptogenic stroke The patient presents with cryptogenic stroke.  I spoke at length with the patient and his daughter at bedside about monitoring for afib with either a 30 day event monitor or an implantable loop recorder.  Risks, benefits, and alteratives to implantable loop recorder were discussed with the patient today.   At this time, the patient does not want to pursue monitoring.  He would like to follow up with Dr. Einar Gip and see what his recommendations are, though inclined to want to start with event monitoring.  Recommend early follow up be arranged with Dr.  Einar Gip after discharge.   Baldwin Jamaica, PA-C 07/30/2019  I have seen and examined this patient with Tommye Standard  Agree with above, note added to reflect my findings.  On exam, RRR, no murmurs.  Patient admitted to the hospital with cryptogenic stroke.  No source has been found.  He would likely benefit from a Linq monitor implant to look for atrial fibrillation.  Despite this and extensive discussion with the patient, he would like to discuss his options further with his primary cardiologist.  If Linq monitoring is indicated at that point, would be happy to implant as an outpatient.   Jaxden Blyden M. Travaris Kosh MD 07/30/2019 3:37 PM

## 2019-07-30 NOTE — Progress Notes (Signed)
Carotid duplex       has been completed. Preliminary results can be found under CV proc through chart review. Ivon Oelkers, BS, RDMS, RVT   

## 2019-07-30 NOTE — Progress Notes (Addendum)
STROKE TEAM PROGRESS NOTE   INTERVAL HISTORY His daughter is at the bedside. Reviewed stroke diagnosis and recommend Loop recorder. This was d/w the EP team, but pt declined insertion and want to return to his out pt cardiologist, Dr Einar Gip. Neuro exam has improved right side weakness since admit.  I personally reviewed history of presenting illness with the patient and daughter, electronic medical records and imaging films in PACS. Vitals:   07/30/19 0800 07/30/19 0900 07/30/19 1215 07/30/19 1254  BP: (!) 158/59 (!) 152/68 (!) 200/79 (!) 187/79  Pulse: 61 (!) 57 (!) 53 (!) 52  Resp: 17 18 18 18   Temp:    98.5 F (36.9 C)  TempSrc:    Oral  SpO2: 96% 97% 97% 99%  Weight:    76 kg  Height:    5\' 9"  (1.753 m)    CBC:  Recent Labs  Lab 07/29/19 1158 07/29/19 1158 07/29/19 1206 07/30/19 0551  WBC 4.6  --   --  4.6  NEUTROABS 2.0  --   --  2.3  HGB 14.6   < > 14.3 13.9  HCT 44.5   < > 42.0 42.9  MCV 98.5  --   --  98.8  PLT 233  --   --  212   < > = values in this interval not displayed.    Basic Metabolic Panel:  Recent Labs  Lab 07/29/19 1158 07/29/19 1158 07/29/19 1206 07/29/19 1304 07/30/19 0551  NA 141   < > 141  --  141  K 3.6   < > 3.5  --  3.5  CL 110   < > 108  --  109  CO2 20*  --   --   --  20*  GLUCOSE 105*   < > 106*  --  68*  BUN 5*   < > 7*  --  8  CREATININE 1.21   < > 1.30*  --  1.29*  CALCIUM 9.1  --   --   --  8.9  MG  --   --   --  2.0  --    < > = values in this interval not displayed.   Lipid Panel:     Component Value Date/Time   CHOL 114 07/30/2019 0551   CHOL 163 06/09/2019 1024   TRIG 111 07/30/2019 0551   HDL 41 07/30/2019 0551   HDL 65 06/09/2019 1024   CHOLHDL 2.8 07/30/2019 0551   VLDL 22 07/30/2019 0551   LDLCALC 51 07/30/2019 0551   LDLCALC 84 06/09/2019 1024   HgbA1c:  Lab Results  Component Value Date   HGBA1C 6.2 (H) 07/30/2019   Urine Drug Screen:     Component Value Date/Time   LABOPIA NONE DETECTED 07/29/2019  1158   COCAINSCRNUR NONE DETECTED 07/29/2019 1158   LABBENZ NONE DETECTED 07/29/2019 1158   AMPHETMU NONE DETECTED 07/29/2019 1158   THCU NONE DETECTED 07/29/2019 1158   LABBARB NONE DETECTED 07/29/2019 1158    Alcohol Level     Component Value Date/Time   ETH <10 07/29/2019 1158    IMAGING past 24 hours MR ANGIO HEAD WO CONTRAST  Result Date: 07/29/2019 CLINICAL DATA:  Stroke, follow-up EXAM: MRI HEAD WITHOUT CONTRAST MRA HEAD WITHOUT CONTRAST TECHNIQUE: Multiplanar, multiecho pulse sequences of the brain and surrounding structures were obtained without intravenous contrast. Angiographic images of the head were obtained using MRA technique without contrast. COMPARISON:  None. FINDINGS: MRI HEAD Brain: There is cortical/subcortical restricted diffusion  involving the posterior left insula and superior left temporal lobe. Additional restricted diffusion is noted spanning the posterior left lentiform nucleus to the posterior body of the caudate with involvement of intervening white matter. There is a small area of associated susceptibility in the superior left temporal lobe reflecting petechial hemorrhage. There is no intracranial mass or significant mass effect. Additional minimal patchy T2 hyperintensity in the supratentorial white matter is nonspecific but may reflect minor chronic microvascular ischemic changes. There is no hydrocephalus or extra-axial fluid collection. Vascular: Major vessel flow voids at the skull base are preserved. Skull and upper cervical spine: No suspicious osseous lesion. Sinuses/Orbits: Trace mucosal thickening.  Orbits are unremarkable. Other: Sella is unremarkable.  Mastoid air cells are clear. MRA HEAD Intracranial internal carotid arteries are patent with atherosclerotic irregularity. There is persistent nonocclusive thrombus within a proximal left M2 MCA branch. Right middle and both anterior cerebral arteries are patent. Intracranial vertebral arteries, basilar  artery, posterior cerebral arteries are patent. There is no aneurysm. IMPRESSION: Acute left MCA territory infarcts involving the superior temporal lobe, posterior insula, and basal ganglia and adjacent white matter. Small petechial hemorrhage is present. Persistent nonocclusive thrombus within a proximal left M2 MCA branch. Electronically Signed   By: Macy Mis M.D.   On: 07/29/2019 16:05   MR BRAIN WO CONTRAST  Result Date: 07/29/2019 CLINICAL DATA:  Stroke, follow-up EXAM: MRI HEAD WITHOUT CONTRAST MRA HEAD WITHOUT CONTRAST TECHNIQUE: Multiplanar, multiecho pulse sequences of the brain and surrounding structures were obtained without intravenous contrast. Angiographic images of the head were obtained using MRA technique without contrast. COMPARISON:  None. FINDINGS: MRI HEAD Brain: There is cortical/subcortical restricted diffusion involving the posterior left insula and superior left temporal lobe. Additional restricted diffusion is noted spanning the posterior left lentiform nucleus to the posterior body of the caudate with involvement of intervening white matter. There is a small area of associated susceptibility in the superior left temporal lobe reflecting petechial hemorrhage. There is no intracranial mass or significant mass effect. Additional minimal patchy T2 hyperintensity in the supratentorial white matter is nonspecific but may reflect minor chronic microvascular ischemic changes. There is no hydrocephalus or extra-axial fluid collection. Vascular: Major vessel flow voids at the skull base are preserved. Skull and upper cervical spine: No suspicious osseous lesion. Sinuses/Orbits: Trace mucosal thickening.  Orbits are unremarkable. Other: Sella is unremarkable.  Mastoid air cells are clear. MRA HEAD Intracranial internal carotid arteries are patent with atherosclerotic irregularity. There is persistent nonocclusive thrombus within a proximal left M2 MCA branch. Right middle and both anterior  cerebral arteries are patent. Intracranial vertebral arteries, basilar artery, posterior cerebral arteries are patent. There is no aneurysm. IMPRESSION: Acute left MCA territory infarcts involving the superior temporal lobe, posterior insula, and basal ganglia and adjacent white matter. Small petechial hemorrhage is present. Persistent nonocclusive thrombus within a proximal left M2 MCA branch. Electronically Signed   By: Macy Mis M.D.   On: 07/29/2019 16:05   VAS US CAROTID  Result Date: 07/30/2019 Carotid Arterial Duplex Study Indications:       Carotid artery disease. Other Factors:     CTA 07/29/19 fibromuscular displasia bilateral ICAs. Right no                    stenosis. Left 25% narrowing. Comparison Study:  06/10/19 right 16-49% left 50-69% (at outside facility) Performing Technologist: June Leap RDMS, RVT  Examination Guidelines: A complete evaluation includes B-mode imaging, spectral Doppler, color Doppler, and power  Doppler as needed of all accessible portions of each vessel. Bilateral testing is considered an integral part of a complete examination. Limited examinations for reoccurring indications may be performed as noted.  Right Carotid Findings: +----------+--------+--------+--------+----------------------+--------+           PSV cm/sEDV cm/sStenosisPlaque Description    Comments +----------+--------+--------+--------+----------------------+--------+ CCA Prox  81      10                                             +----------+--------+--------+--------+----------------------+--------+ CCA Distal73      12                                             +----------+--------+--------+--------+----------------------+--------+ ICA Prox  133     29      1-39%   homogeneous and smooth         +----------+--------+--------+--------+----------------------+--------+ ICA Mid   111     21                                              +----------+--------+--------+--------+----------------------+--------+ ICA Distal138     29                                             +----------+--------+--------+--------+----------------------+--------+ ECA       163     17                                             +----------+--------+--------+--------+----------------------+--------+ +----------+--------+-------+----------------+-------------------+           PSV cm/sEDV cmsDescribe        Arm Pressure (mmHG) +----------+--------+-------+----------------+-------------------+ [id number]            Multiphasic, WNL                    +----------+--------+-------+----------------+-------------------+ +---------+--------+--+--------+--+---------+ VertebralPSV cm/s86EDV cm/s19Antegrade +---------+--------+--+--------+--+---------+  Left Carotid Findings: +----------+--------+--------+--------+------------------+---------------------+           PSV cm/sEDV cm/sStenosisPlaque DescriptionComments              +----------+--------+--------+--------+------------------+---------------------+ CCA Prox  121     17                                                      +----------+--------+--------+--------+------------------+---------------------+ CCA Distal64      14                                                      +----------+--------+--------+--------+------------------+---------------------+ ICA Prox  99      22      1-39%                                           +----------+--------+--------+--------+------------------+---------------------+  ICA Mid   140     37                                elevated velocities                                                       mid ICA               +----------+--------+--------+--------+------------------+---------------------+ ICA Distal200     42                                                       +----------+--------+--------+--------+------------------+---------------------+ ECA       119     13                                                      +----------+--------+--------+--------+------------------+---------------------+ +----------+--------+--------+----------------+-------------------+           PSV cm/sEDV cm/sDescribe        Arm Pressure (mmHG) +----------+--------+--------+----------------+-------------------+ [id number]             Multiphasic, WNL                    +----------+--------+--------+----------------+-------------------+ +---------+--------+--+--------+--+---------+ VertebralPSV cm/s85EDV cm/s23Antegrade +---------+--------+--+--------+--+---------+   Summary: Right Carotid: Velocities in the right ICA are consistent with a 1-39% stenosis. Left Carotid: Velocities in the left ICA are consistent with a 1-39% stenosis.               Elevated velocities noted in the mid ICA. Vertebrals:  Bilateral vertebral arteries demonstrate antegrade flow. Subclavians: Normal flow hemodynamics were seen in bilateral subclavian              arteries. *See table(s) above for measurements and observations.  Electronically signed by Antony Contras MD on 07/30/2019 at 1:49:42 PM.    Final    ECHOCARDIOGRAM LIMITED  Result Date: 07/30/2019    ECHOCARDIOGRAM LIMITED REPORT   Patient Name:   Shawn Schmidt Date of Exam: 07/30/2019 Medical Rec #:  [id number]      Height:       69.0 in Accession #:    [id number]     Weight:       173.8 lb Date of Birth:  06/29/1940     BSA:          1.946 m Patient Age:    30 years       BP:           146/131 mmHg Patient Gender: M              HR:           51 bpm. Exam Location:  Inpatient Procedure: Intracardiac Opacification Agent and Limited Echo Indications:    Stroke 434.91 / I163.9  History:        Patient has prior history of Echocardiogram examinations, most  recent 07/29/2019. CHF and Cardiomyopathy, CAD, PAD and Carotid                  Disease; Risk Factors:Hypertension and Dyslipidemia.  Sonographer:    Jonelle Sidle Dance Referring Phys: [place]  1. Limited f/u study with definity Moderate LVE with septal and apical akinesis EF 40-45% no mural apical thrombus. FINDINGS  Left Ventricle: Definity contrast agent was given IV to delineate the left ventricular endocardial borders. Additional Comments: Limited f/u study with definity Moderate LVE with septal and apical akinesis EF 40-45% no mural apical thrombus. Jenkins Rouge MD Electronically signed by Jenkins Rouge MD Signature Date/Time: 07/30/2019/11:16:26 AM    Final     PHYSICAL EXAM Constitutional: Frail elderly African-American male no acute distress Psych: Affect appropriate to situation Eyes: No scleral injection HENT: No OP obstrucion MSK: no joint deformities.  Cardiovascular: Normal rate and regular rhythm.  Respiratory: Effort normal, non-labored breathing GI: Soft.  No distension. There is no tenderness.  Skin: WDI  Neuro: Mental Status: Patient is awake, alert, oriented to person, place, month, year, and situation. Patient is able to give a clear and coherent history. No signs of aphasia or neglect Cranial Nerves: II: Visual Fields are full. Pupils are equal, round, and reactive to light.   III,IV, VI: EOMI without ptosis or diploplia.  V: Facial sensation is diminished on the right VII: Facial movement with mild weakness on the right he is slightly dysarthric VIII: hearing is intact to voice X: Uvula elevates symmetrically XI: Shoulder shrug is symmetric. XII: tongue is midline without atrophy or fasciculations.  Motor: Tone is normal. Bulk is normal. 5/5 strength was present on the left, he has 4/5 weakness of the right arm, though he is able to hold it up off the bed for 10 seconds.  4+/5 weakness of the right leg Sensory: Sensation is diminished on the right Cerebellar: FNF and HKS are consistent with weakness on the  right, intact on the left  ASSESSMENT/PLAN Mr. ANARI TINSON is a 79 y.o. male with history of CHF, ischemic cardiomyopathy, PAD,CAD, HTN, HLD. On 4/15 pt presenting with wake up right side weakness. No tPA given d/t outside of window. Thrombectomy was considered, but due to low NIH and improving exam, this was not done.   StrokeLMCA infarct embolic appearing, source not currently found. Recommend Loop, but pt declined. Consider holter monitor for d/c  Code Stroke CT head No acute abnormality. CTA head & neck partially occluded  LM2  CT perfusion without deficit  MRI LMCA infarcts; small petechial hemorrhagic conversion  MRA Left M2 MCA  Carotid Doppler  Neg for stenosis  2D Echo   LDL 51  HgbA1c 6.2  Lovenox for VTE prophylaxis    Diet   Diet clear liquid Room service appropriate? Yes; Fluid consistency: Thin     ASA 81mg  prior to admission, now on ASA + Plavix  Therapy recommendations:  pending  Disposition:  pending  Hypertension  Home meds:  Coreg,Apresoline, Nirto, Entresto, Aldactone  Stable . Permissive hypertension (OK if < 220/120) but gradually normalize in 5-7 days . Long-term BP goal normotensive  Hyperlipidemia  Home meds: Zetia & Crestor, resumed in hospital  LDL 51, goal < 70  Continue statin at discharge   No dx of Diabetes  HgbA1c 6.2, goal < 7.0  CBGs Recent Labs    07/29/19 1157 07/30/19 0717 07/30/19 0745  GLUCAP 97 58* 105*      Other Stroke Risk  Factors  Advanced age  Previous Cigarette smoker  Hx stroke/TIA  Family hx stroke   Coronary artery disease  Congestive heart failure  Other Active Problems  PAD, Ischemic cardiomyopathy  Hospital day # 1  Desiree Metzger-Cihelka, ARNP-C, ANVP-BC Pager: [phone] I have personally obtained history,examined this patient, reviewed notes, independently viewed imaging studies, participated in medical decision making and plan of care.ROS completed by me personally  and pertinent positives fully documented  I have made any additions or clarifications directly to the above note. Agree with note above.  Patient has presented with embolic left MCA branch infarct of cryptogenic etiology.  Strong suspicion for paroxysmal A. fib.  Patient will benefit with long-term cardiac monitoring but is refusing loop recorder insertion but appears to be agreeable for outpatient cardiac monitor to be arranged by his cardiologist.  Recommend aspirin and Plavix for 3 weeks followed by Plavix alone.  We also briefly discuss with the patient and daughter interested in possible participation in the BMS stroke prevention trial but after reviewing consent form they declined.  Greater than 50% time during the 35-minute visit was spent in counseling and coordination of care about his embolic stroke and discussion about evaluation and treatment and answering questions.  Discussed with Dr. Genella Mech, [place] Pager: [phone] 07/30/2019 5:58 PM  To contact Stroke Continuity provider, please refer to [email or web address] After hours, contact General Neurology

## 2019-07-30 NOTE — Plan of Care (Signed)
  Problem: Self-Care: Goal: Ability to communicate needs accurately will improve Outcome: Progressing   Problem: Elimination: Goal: Will not experience complications related to urinary retention Outcome: Progressing   Problem: Pain Managment: Goal: General experience of comfort will improve Outcome: Progressing   Problem: Safety: Goal: Ability to remain free from injury will improve Outcome: Progressing

## 2019-07-30 NOTE — ED Notes (Signed)
Report given to 3 west 

## 2019-07-30 NOTE — Evaluation (Signed)
Physical Therapy Evaluation Patient Details Name: Shawn Schmidt MRN: [id number] DOB: Jan 10, 1941 Today's Date: 07/30/2019   History of Present Illness  Pt is a 79 y/o male admitted secondary to R extremity weakness. Found to have L MCA infarct. PMH includes CAD, HTN, PAD, CHF, and ischemic cardiomyopathy.   Clinical Impression  Pt admitted secondary to problem above with deficits below. Pt reports R extremity weakness feels as if it has resolved. Pt with mild unsteadiness during gait requiring min guard A. Feel pt will progress well and be able to d/c home with HHPT. Will continue to follow acutely to maximize functional mobility independence and safety.     Follow Up Recommendations Home health PT;Supervision for mobility/OOB    Equipment Recommendations  Other (comment)(TBD)    Recommendations for Other Services       Precautions / Restrictions Precautions Precautions: Fall Restrictions Weight Bearing Restrictions: No      Mobility  Bed Mobility Overal bed mobility: Needs Assistance Bed Mobility: Supine to Sit;Sit to Supine     Supine to sit: Min assist Sit to supine: Supervision   General bed mobility comments: Min A for trunk elevation to come to sitting on stretcher. Supervision for return to supine.   Transfers Overall transfer level: Needs assistance Equipment used: None Transfers: Sit to/from Stand Sit to Stand: Min guard         General transfer comment: Min guard for safety.   Ambulation/Gait Ambulation/Gait assistance: Min guard Gait Distance (Feet): 5 Feet Assistive device: None Gait Pattern/deviations: Step-through pattern;Decreased stride length Gait velocity: Decreased   General Gait Details: Practiced forward and backward walking to and from stretcher. Pt hooked to monitors in ED, so mobility limited to room. Mild unsteadiness noted, however, no overt LOB noted.   Stairs            Wheelchair Mobility    Modified Rankin (Stroke  Patients Only) Modified Rankin (Stroke Patients Only) Pre-Morbid Rankin Score: No symptoms Modified Rankin: Moderately severe disability     Balance Overall balance assessment: Needs assistance Sitting-balance support: No upper extremity supported;Feet supported Sitting balance-Leahy Scale: Good     Standing balance support: During functional activity;No upper extremity supported Standing balance-Leahy Scale: Fair                               Pertinent Vitals/Pain Pain Assessment: No/denies pain    Home Living Family/patient expects to be discharged to:: Private residence Living Arrangements: Children Available Help at Discharge: Family;Available PRN/intermittently Type of Home: Mobile home Home Access: Stairs to enter Entrance Stairs-Rails: Right;Left;Can reach both Entrance Stairs-Number of Steps: 4 Home Layout: One level Home Equipment: Cane - single point      Prior Function Level of Independence: Independent               Hand Dominance        Extremity/Trunk Assessment   Upper Extremity Assessment Upper Extremity Assessment: Defer to OT evaluation    Lower Extremity Assessment Lower Extremity Assessment: Generalized weakness(Coordination WFL; sensation WFL )    Cervical / Trunk Assessment Cervical / Trunk Assessment: Normal  Communication   Communication: No difficulties  Cognition Arousal/Alertness: Awake/alert Behavior During Therapy: WFL for tasks assessed/performed Overall Cognitive Status: Within Functional Limits for tasks assessed  General Comments      Exercises     Assessment/Plan    PT Assessment Patient needs continued PT services  PT Problem List Decreased strength;Decreased balance;Decreased mobility;Decreased knowledge of precautions       PT Treatment Interventions Gait training;DME instruction;Stair training;Functional mobility training;Balance  training;Therapeutic exercise;Therapeutic activities;Patient/family education    PT Goals (Current goals can be found in the Care Plan section)  Acute Rehab PT Goals Patient Stated Goal: to go home PT Goal Formulation: With patient Time For Goal Achievement: 08/13/19 Potential to Achieve Goals: Good    Frequency Min 4X/week   Barriers to discharge        Co-evaluation               AM-PAC PT "6 Clicks" Mobility  Outcome Measure Help needed turning from your back to your side while in a flat bed without using bedrails?: A Little Help needed moving from lying on your back to sitting on the side of a flat bed without using bedrails?: A Little Help needed moving to and from a bed to a chair (including a wheelchair)?: A Little Help needed standing up from a chair using your arms (e.g., wheelchair or bedside chair)?: A Little Help needed to walk in hospital room?: A Little Help needed climbing 3-5 steps with a railing? : A Lot 6 Click Score: 17    End of Session Equipment Utilized During Treatment: Gait belt Activity Tolerance: Patient tolerated treatment well Patient left: in bed;with call bell/phone within reach Nurse Communication: Mobility status PT Visit Diagnosis: Unsteadiness on feet (R26.81);Muscle weakness (generalized) (M62.81)    Time: 1100-1113 PT Time Calculation (min) (ACUTE ONLY): 13 min   Charges:   PT Evaluation $PT Eval Low Complexity: 1 Low          Lou Miner, DPT  Acute Rehabilitation Services  Pager: [phone] Office: [phone]   Rudean Hitt 07/30/2019, 12:54 PM

## 2019-07-30 NOTE — Progress Notes (Signed)
PROGRESS NOTE  HERMINIO COURSEN  [id number] DOB: [birth year] DOA: 07/29/2019 PCP: [name], MD   Brief Narrative: JERRION ARANA is a 79 y.o. male with medical history significant of ischemic cardiomyopathy, coronary artery disease, carotid stenosis, hyperlipidemia, hypertension, tobacco abuse, peripheral artery disease presents to emergency department due to right-sided weakness which started this morning.  He tells me that he was doing fine last night however this morning he stated that he could not move at all.  No headache, blurry vision, slurred speech, facial droop, loss of consciousness, seizures, previous history of stroke, chest pain, shortness of breath, palpitation, leg swelling, fever, chills, nausea, vomiting, urinary or bowel changes.  He lives with his daughter at home.  Smokes 1 pack of cigarettes per day, drinks alcohol occasionally.  No history of illicit drug use.  ED Course: Upon arrival to ED: Patient's blood pressure in 175/63, heart rate in 50s, CBC, CMP, PT/INR, blood glucose, ethanol: WNL.  UA, UDS, COVID-19 pending.  Code stroke was called as patient had right-sided weakness with some expressive aphasia.  CT head without contrast shows acute infarct of left temporal lobe without hemorrhage.  EDP consulted neurology.  [facility] hospitalist consulted for admission for stroke management.  Assessment & Plan: Principal Problem:   CVA (cerebral vascular accident) ([place]) Active Problems:   Coronary artery disease   HTN (hypertension)   Hyperlipemia   Ischemic cardiomyopathy   PAD (peripheral artery disease) ([facility])   Bradycardia   AKI (acute kidney injury) ([place])  Acute infarction of left temporal lobe: Suspected to be embolic left MCA branch infarct.  - Will pursue longterm cardiac monitoring for high suspicion of AFib. Loop recorder vs. outpatient follow up.  - Previously on aspirin, will start DAPT x3 weeks, then plavix step up monotherapy alone. - Symptoms  improving. No indication for NIR. - Initial echo showed no CES, but apical akinesis could present nidus for clot. Limited echocardiogram ordered with definity contrast which did not show any CES.  - Continue statin/zetia, LDL at goal 51. HbA1c 6.2% will benefit from outpatient follow up.  - Continue PT/OT  Hypertension: -Holding Coreg, hydralazine, Entresto and Aldactone for now to allow permissive hypertension. BP elevated but not severely so. Plan to restart stepwise over next 24-48 hours.  Ischemic cardiomyopathy/chronic congestive heart failure with reduced ejection fraction: Patient is euvolemic. Echo confirms abnormalities already known. Pt follows with Dr. Einar Gip.  -On aspirin, statin, Coreg, Entresto and Aldactone -Continue aspirin, statin.  Hold Coreg, Entresto and Aldactone to allow permissive hypertension. -Strict INO's and daily weight.  Check electrolytes. -Monitor signs for fluid overload.  Hyperlipidemia: Check lipid panel -Continue Zetia and statin  AKI: Mild -Hold nephrotoxins, monitor  Carotid artery atherosclerosis/PAD: -Continue Zetia, aspirin, statin  Tobacco abuse: -Refused nicotine patch. -Counseled about cessation  Asymptomatic bradycardia: On telemetry -Could be due to Coreg -Continue to monitor  DVT prophylaxis: Lovenox Code Status: Full Family Communication At bedside Disposition Plan:  Status is: Inpatient  Remains inpatient appropriate because:Ongoing diagnostic testing needed not appropriate for outpatient work up   Dispo: The patient is from: Home              Anticipated d/c is to: Home              Anticipated d/c date is: 1 day              Patient currently is not medically stable to d/c.        Consultants:   Neurology  Procedures:   Echo  Antimicrobials:  None   Subjective: Right sided sensory changes are stable, improving overall. No speech difficulties presently. Denies HA, chest pain, palpitations, leg  swelling, orthopnea.  Objective: Vitals:   07/30/19 0900 07/30/19 1215 07/30/19 1254 07/30/19 1703  BP: (!) 152/68 (!) 200/79 (!) 187/79 (!) 173/76  Pulse: (!) 57 (!) 53 (!) 52 (!) 53  Resp: 18 18 18 18   Temp:   98.5 F (36.9 C) 98.6 F (37 C)  TempSrc:   Oral Oral  SpO2: 97% 97% 99% 100%  Weight:   76 kg   Height:   5\' 9"  (1.753 m)     Intake/Output Summary (Last 24 hours) at 07/30/2019 1839 Last data filed at 07/30/2019 1423 Gross per 24 hour  Intake 360 ml  Output 225 ml  Net 135 ml   Filed Weights   07/29/19 1244 07/30/19 1254  Weight: 78.8 kg 76 kg    Gen: 79 y.o. male in no distress  Pulm: Non-labored breathing . Clear to auscultation bilaterally.  CV: Regular rate and rhythm. No murmur, rub, or gallop. No JVD, no pedal edema. GI: Abdomen soft, non-tender, non-distended, with normoactive bowel sounds. No organomegaly or masses felt. Ext: Warm, no deformities Skin: No rashes, lesions or ulcers Neuro: Alert and oriented. all extremities with 5/5 strength (notable improvement from prior) Psych: Judgement and insight appear normal. Mood & affect appropriate.   Data Reviewed: I have personally reviewed following labs and imaging studies  CBC: Recent Labs  Lab 07/29/19 1158 07/29/19 1206 07/30/19 0551  WBC 4.6  --  4.6  NEUTROABS 2.0  --  2.3  HGB 14.6 14.3 13.9  HCT 44.5 42.0 42.9  MCV 98.5  --  98.8  PLT 233  --  [id number]   Basic Metabolic Panel: Recent Labs  Lab 07/29/19 1158 07/29/19 1206 07/29/19 1304 07/30/19 0551  NA 141 141  --  141  K 3.6 3.5  --  3.5  CL 110 108  --  109  CO2 20*  --   --  20*  GLUCOSE 105* 106*  --  68*  BUN 5* 7*  --  8  CREATININE 1.21 1.30*  --  1.29*  CALCIUM 9.1  --   --  8.9  MG  --   --  2.0  --    GFR: Estimated Creatinine Clearance: 48.7 mL/min (A) (by C-G formula based on SCr of 1.29 mg/dL (H)). Liver Function Tests: Recent Labs  Lab 07/29/19 1158  AST 21  ALT 17  ALKPHOS 58  BILITOT 0.8  PROT 7.3  ALBUMIN  3.6   No results for input(s): LIPASE, AMYLASE in the last 168 hours. No results for input(s): AMMONIA in the last 168 hours. Coagulation Profile: Recent Labs  Lab 07/29/19 1158  INR 1.0   Cardiac Enzymes: No results for input(s): CKTOTAL, CKMB, CKMBINDEX, TROPONINI in the last 168 hours. BNP (last 3 results) No results for input(s): PROBNP in the last 8760 hours. HbA1C: Recent Labs    07/30/19 0551  HGBA1C 6.2*   CBG: Recent Labs  Lab 07/29/19 1157 07/30/19 0717 07/30/19 0745  GLUCAP 97 58* 105*   Lipid Profile: Recent Labs    07/30/19 0551  CHOL 114  HDL 41  LDLCALC 51  TRIG 111  CHOLHDL 2.8   Thyroid Function Tests: No results for input(s): TSH, T4TOTAL, FREET4, T3FREE, THYROIDAB in the last 72 hours. Anemia Panel: No results for input(s): VITAMINB12, FOLATE, FERRITIN, TIBC, IRON, RETICCTPCT  in the last 72 hours. Urine analysis:    Component Value Date/Time   COLORURINE STRAW (A) 07/29/2019 1158   APPEARANCEUR CLEAR 07/29/2019 1158   LABSPEC 1.035 (H) 07/29/2019 1158   PHURINE 6.0 07/29/2019 1158   GLUCOSEU NEGATIVE 07/29/2019 1158   HGBUR SMALL (A) 07/29/2019 1158   BILIRUBINUR NEGATIVE 07/29/2019 1158   KETONESUR NEGATIVE 07/29/2019 1158   PROTEINUR NEGATIVE 07/29/2019 1158   NITRITE NEGATIVE 07/29/2019 1158   LEUKOCYTESUR NEGATIVE 07/29/2019 1158   Recent Results (from the past 240 hour(s))  SARS CORONAVIRUS 2 (TAT 6-24 HRS) Nasopharyngeal Nasopharyngeal Swab     Status: None   Collection Time: 07/29/19 12:51 PM   Specimen: Nasopharyngeal Swab  Result Value Ref Range Status   SARS Coronavirus 2 NEGATIVE NEGATIVE Final    Comment: (NOTE) SARS-CoV-2 target nucleic acids are NOT DETECTED. The SARS-CoV-2 RNA is generally detectable in upper and lower respiratory specimens during the acute phase of infection. Negative results do not preclude SARS-CoV-2 infection, do not rule out co-infections with other pathogens, and should not be used as  the sole basis for treatment or other patient management decisions. Negative results must be combined with clinical observations, patient history, and epidemiological information. The expected result is Negative. Fact Sheet for Patients: SugarRoll.be Fact Sheet for Healthcare Providers: https://www.woods-mathews.com/ This test is not yet approved or cleared by the [place] FDA and  has been authorized for detection and/or diagnosis of SARS-CoV-2 by FDA under an Emergency Use Authorization (EUA). This EUA will remain  in effect (meaning this test can be used) for the duration of the COVID-19 declaration under Section 56 4(b)(1) of the Act, 21 U.S.C. section 360bbb-3(b)(1), unless the authorization is terminated or revoked sooner. Performed at [facility], [place] [address] [place], [place] [address]       Radiology Studies: CT Code Stroke CTA Head W/WO contrast  Result Date: 07/29/2019 CLINICAL DATA:  Stroke EXAM: CT ANGIOGRAPHY HEAD AND NECK TECHNIQUE: Multidetector CT imaging of the head and neck was performed using the standard protocol during bolus administration of intravenous contrast. Multiplanar CT image reconstructions and MIPs were obtained to evaluate the vascular anatomy. Carotid stenosis measurements (when applicable) are obtained utilizing NASCET criteria, using the distal internal carotid diameter as the denominator. CONTRAST:  52mL OMNIPAQUE IOHEXOL 350 MG/ML SOLN COMPARISON:  CT head immediately preceding the CTA. FINDINGS: CTA NECK FINDINGS Aortic arch: Atherosclerotic aortic arch. Four vessel branching of the arch with left vertebral artery origin from the arch. Atherosclerotic disease and mild stenosis proximal left subclavian artery. Right carotid system: Atherosclerotic disease right carotid bifurcation without significant stenosis. Beading of the right internal carotid artery compatible with fibromuscular dysplasia  without aneurysm or dissection. Left carotid system: Left common carotid artery widely patent. Atherosclerotic disease left carotid bifurcation. Approximately 25% diameter stenosis proximal left internal carotid artery due to noncalcified plaque. Fibromuscular dysplasia in the left internal carotid artery without dissection or aneurysm. Vertebral arteries: Right vertebral artery is widely patent. Left vertebral artery origin from the aortic arch without significant stenosis. Skeleton: Cervical spondylosis without acute skeletal abnormality. Other neck: Negative for mass or adenopathy in the neck. Upper chest: No acute abnormality in the lung apices bilaterally. Review of the MIP images confirms the above findings CTA HEAD FINDINGS Anterior circulation: Atherosclerotic calcification in the cavernous carotid bilaterally. Mild to moderate stenosis in the cavernous carotid bilaterally. Short segment occlusion left M2 branch most likely due to clot. Left M1 segment widely patent. Right middle cerebral artery  patent without significant stenosis. Posterior circulation: Both vertebral arteries patent to the basilar. PICA patent bilaterally. Basilar widely patent. Superior cerebellar and posterior cerebral arteries patent bilaterally without stenosis. Venous sinuses: Normal venous enhancement Anatomic variants: None Review of the MIP images confirms the above findings IMPRESSION: 1. Short segment occlusion left M2 branch consistent with acute thrombus. 2. Mild to moderate atherosclerotic stenosis in the cavernous carotid bilaterally. 3. Mild atherosclerotic disease carotid bifurcation bilaterally without significant stenosis. 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 4. Both vertebral arteries widely patent without stenosis. 5. These results were called by telephone at the time of interpretation on 07/29/2019 at 12:33 pm to provider MCNEILL [facility] , who verbally acknowledged these results.  Electronically Signed   By: Franchot Gallo M.D.   On: 07/29/2019 12:33   CT Code Stroke CTA Neck W/WO contrast  Result Date: 07/29/2019 CLINICAL DATA:  Stroke EXAM: CT ANGIOGRAPHY HEAD AND NECK TECHNIQUE: Multidetector CT imaging of the head and neck was performed using the standard protocol during bolus administration of intravenous contrast. Multiplanar CT image reconstructions and MIPs were obtained to evaluate the vascular anatomy. Carotid stenosis measurements (when applicable) are obtained utilizing NASCET criteria, using the distal internal carotid diameter as the denominator. CONTRAST:  68mL OMNIPAQUE IOHEXOL 350 MG/ML SOLN COMPARISON:  CT head immediately preceding the CTA. FINDINGS: CTA NECK FINDINGS Aortic arch: Atherosclerotic aortic arch. Four vessel branching of the arch with left vertebral artery origin from the arch. Atherosclerotic disease and mild stenosis proximal left subclavian artery. Right carotid system: Atherosclerotic disease right carotid bifurcation without significant stenosis. Beading of the right internal carotid artery compatible with fibromuscular dysplasia without aneurysm or dissection. Left carotid system: Left common carotid artery widely patent. Atherosclerotic disease left carotid bifurcation. Approximately 25% diameter stenosis proximal left internal carotid artery due to noncalcified plaque. Fibromuscular dysplasia in the left internal carotid artery without dissection or aneurysm. Vertebral arteries: Right vertebral artery is widely patent. Left vertebral artery origin from the aortic arch without significant stenosis. Skeleton: Cervical spondylosis without acute skeletal abnormality. Other neck: Negative for mass or adenopathy in the neck. Upper chest: No acute abnormality in the lung apices bilaterally. Review of the MIP images confirms the above findings CTA HEAD FINDINGS Anterior circulation: Atherosclerotic calcification in the cavernous carotid bilaterally. Mild to  moderate stenosis in the cavernous carotid bilaterally. Short segment occlusion left M2 branch most likely due to clot. Left M1 segment widely patent. Right middle cerebral artery patent without significant stenosis. Posterior circulation: Both vertebral arteries patent to the basilar. PICA patent bilaterally. Basilar widely patent. Superior cerebellar and posterior cerebral arteries patent bilaterally without stenosis. Venous sinuses: Normal venous enhancement Anatomic variants: None Review of the MIP images confirms the above findings IMPRESSION: 1. Short segment occlusion left M2 branch consistent with acute thrombus. 2. Mild to moderate atherosclerotic stenosis in the cavernous carotid bilaterally. 3. Mild atherosclerotic disease carotid bifurcation bilaterally without significant stenosis. 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 4. Both vertebral arteries widely patent without stenosis. 5. These results were called by telephone at the time of interpretation on 07/29/2019 at 12:33 pm to provider MCNEILL [facility] , who verbally acknowledged these results. Electronically Signed   By: Franchot Gallo M.D.   On: 07/29/2019 12:33   MR ANGIO HEAD WO CONTRAST  Result Date: 07/29/2019 CLINICAL DATA:  Stroke, follow-up EXAM: MRI HEAD WITHOUT CONTRAST MRA HEAD WITHOUT CONTRAST TECHNIQUE: Multiplanar, multiecho pulse sequences of the brain and surrounding structures were obtained without intravenous contrast.  Angiographic images of the head were obtained using MRA technique without contrast. COMPARISON:  None. FINDINGS: MRI HEAD Brain: There is cortical/subcortical restricted diffusion involving the posterior left insula and superior left temporal lobe. Additional restricted diffusion is noted spanning the posterior left lentiform nucleus to the posterior body of the caudate with involvement of intervening white matter. There is a small area of associated susceptibility in the superior left  temporal lobe reflecting petechial hemorrhage. There is no intracranial mass or significant mass effect. Additional minimal patchy T2 hyperintensity in the supratentorial white matter is nonspecific but may reflect minor chronic microvascular ischemic changes. There is no hydrocephalus or extra-axial fluid collection. Vascular: Major vessel flow voids at the skull base are preserved. Skull and upper cervical spine: No suspicious osseous lesion. Sinuses/Orbits: Trace mucosal thickening.  Orbits are unremarkable. Other: Sella is unremarkable.  Mastoid air cells are clear. MRA HEAD Intracranial internal carotid arteries are patent with atherosclerotic irregularity. There is persistent nonocclusive thrombus within a proximal left M2 MCA branch. Right middle and both anterior cerebral arteries are patent. Intracranial vertebral arteries, basilar artery, posterior cerebral arteries are patent. There is no aneurysm. IMPRESSION: Acute left MCA territory infarcts involving the superior temporal lobe, posterior insula, and basal ganglia and adjacent white matter. Small petechial hemorrhage is present. Persistent nonocclusive thrombus within a proximal left M2 MCA branch. Electronically Signed   By: Macy Mis M.D.   On: 07/29/2019 16:05   MR BRAIN WO CONTRAST  Result Date: 07/29/2019 CLINICAL DATA:  Stroke, follow-up EXAM: MRI HEAD WITHOUT CONTRAST MRA HEAD WITHOUT CONTRAST TECHNIQUE: Multiplanar, multiecho pulse sequences of the brain and surrounding structures were obtained without intravenous contrast. Angiographic images of the head were obtained using MRA technique without contrast. COMPARISON:  None. FINDINGS: MRI HEAD Brain: There is cortical/subcortical restricted diffusion involving the posterior left insula and superior left temporal lobe. Additional restricted diffusion is noted spanning the posterior left lentiform nucleus to the posterior body of the caudate with involvement of intervening white matter.  There is a small area of associated susceptibility in the superior left temporal lobe reflecting petechial hemorrhage. There is no intracranial mass or significant mass effect. Additional minimal patchy T2 hyperintensity in the supratentorial white matter is nonspecific but may reflect minor chronic microvascular ischemic changes. There is no hydrocephalus or extra-axial fluid collection. Vascular: Major vessel flow voids at the skull base are preserved. Skull and upper cervical spine: No suspicious osseous lesion. Sinuses/Orbits: Trace mucosal thickening.  Orbits are unremarkable. Other: Sella is unremarkable.  Mastoid air cells are clear. MRA HEAD Intracranial internal carotid arteries are patent with atherosclerotic irregularity. There is persistent nonocclusive thrombus within a proximal left M2 MCA branch. Right middle and both anterior cerebral arteries are patent. Intracranial vertebral arteries, basilar artery, posterior cerebral arteries are patent. There is no aneurysm. IMPRESSION: Acute left MCA territory infarcts involving the superior temporal lobe, posterior insula, and basal ganglia and adjacent white matter. Small petechial hemorrhage is present. Persistent nonocclusive thrombus within a proximal left M2 MCA branch. Electronically Signed   By: Macy Mis M.D.   On: 07/29/2019 16:05   CT CEREBRAL PERFUSION W CONTRAST  Result Date: 07/29/2019 CLINICAL DATA:  Stroke EXAM: CT PERFUSION BRAIN TECHNIQUE: Multiphase CT imaging of the brain was performed following IV bolus contrast injection. Subsequent parametric perfusion maps were calculated using RAPID software. CONTRAST:  50 mL Isovue 370 IV COMPARISON:  CT a head and neck 07/29/2019 FINDINGS: CT Brain Perfusion Findings: CBF (<30%) Volume: 48mL Perfusion (Tmax>6.0s)  volume: 63mL Mismatch Volume: 6mL ASPECTS on noncontrast CT Head: 9 at 12:04 p.m. today. Infarct Core: 0 mL Infarction Location:None IMPRESSION: CT perfusion negative for acute  infarct or ischemia CT head earlier today reveals hypodensity in the left temporal lobe consistent with acute infarct. These results were called by telephone at the time of interpretation on 07/29/2019 at 12:45 pm to provider MCNEILL [facility] , who verbally acknowledged these results. Electronically Signed   By: Franchot Gallo M.D.   On: 07/29/2019 12:55   ECHOCARDIOGRAM COMPLETE  Result Date: 07/29/2019    ECHOCARDIOGRAM REPORT   Patient Name:   NYAN WHAN Date of Exam: 07/29/2019 Medical Rec #:  [id number]      Height:       69.0 in Accession #:    [id number]     Weight:       173.8 lb Date of Birth:  12/17/1940     BSA:          1.946 m Patient Age:    42 years       BP:           159/66 mmHg Patient Gender: M              HR:           45 bpm. Exam Location:  Inpatient Procedure: 2D Echo, Cardiac Doppler and Color Doppler Indications:    Stroke 434.91 / I163.9  History:        Patient has no prior history of Echocardiogram examinations.                 Cardiomyopathy and CHF, CAD, PAD; Risk Factors:Hypertension and                 Dyslipidemia.  Sonographer:    Jonelle Sidle Dance Referring Phys: [id number] [place]  1. No intracardiac source of embolism was identified, however additional images with Definity echo contrast should be performed as there is apical akinesis and an apical thrombus can't be excluded.  2. Left ventricular ejection fraction, by estimation, is 35 to 40%. The left ventricle has moderately decreased function. The left ventricle demonstrates regional wall motion abnormalities (see scoring diagram/findings for description). Left ventricular  diastolic parameters are consistent with Grade I diastolic dysfunction (impaired relaxation). Elevated left atrial pressure.  3. Right ventricular systolic function is normal. The right ventricular size is normal. There is normal pulmonary artery systolic pressure. The estimated right ventricular systolic pressure is [id number] mmHg.  4.  Left atrial size was mildly dilated.  5. The mitral valve is normal in structure. Mild mitral valve regurgitation. No evidence of mitral stenosis.  6. The aortic valve is normal in structure. Aortic valve regurgitation is not visualized. No aortic stenosis is present.  7. The inferior vena cava is normal in size with greater than 50% respiratory variability, suggesting right atrial pressure of 3 mmHg. FINDINGS  Left Ventricle: Left ventricular ejection fraction, by estimation, is 35 to 40%. The left ventricle has moderately decreased function. The left ventricle demonstrates regional wall motion abnormalities. The left ventricular internal cavity size was normal in size. There is no left ventricular hypertrophy. Left ventricular diastolic parameters are consistent with Grade I diastolic dysfunction (impaired relaxation). Elevated left atrial pressure.  LV Wall Scoring: The mid and distal anterior wall, mid and distal anterior septum, apical inferior segment, and apex are akinetic. The mid inferoseptal segment is normal. Right Ventricle: The right ventricular size is normal. No increase in  right ventricular wall thickness. Right ventricular systolic function is normal. There is normal pulmonary artery systolic pressure. The tricuspid regurgitant velocity is 2.37 m/s, and  with an assumed right atrial pressure of 3 mmHg, the estimated right ventricular systolic pressure is [id number] mmHg. Left Atrium: Left atrial size was mildly dilated. Right Atrium: Right atrial size was normal in size. Pericardium: There is no evidence of pericardial effusion. Mitral Valve: The mitral valve is normal in structure. Normal mobility of the mitral valve leaflets. Mild mitral valve regurgitation. No evidence of mitral valve stenosis. Tricuspid Valve: The tricuspid valve is normal in structure. Tricuspid valve regurgitation is mild . No evidence of tricuspid stenosis. Aortic Valve: The aortic valve is normal in structure. Aortic valve  regurgitation is not visualized. No aortic stenosis is present. Pulmonic Valve: The pulmonic valve was normal in structure. Pulmonic valve regurgitation is not visualized. No evidence of pulmonic stenosis. Aorta: The aortic root is normal in size and structure. Venous: The inferior vena cava is normal in size with greater than 50% respiratory variability, suggesting right atrial pressure of 3 mmHg. IAS/Shunts: No atrial level shunt detected by color flow Doppler.  LEFT VENTRICLE PLAX 2D LVIDd:         5.22 cm  Diastology LVIDs:         3.53 cm  LV e' lateral:   4.28 cm/s LV PW:         1.11 cm  LV E/e' lateral: 15.7 LV IVS:        0.99 cm  LV e' medial:    4.58 cm/s LVOT diam:     2.30 cm  LV E/e' medial:  14.7 LV SV:         71 LV SV Index:   37 LVOT Area:     4.15 cm  RIGHT VENTRICLE            IVC RV Basal diam:  2.50 cm    IVC diam: 1.25 cm RV S prime:     9.75 cm/s TAPSE (M-mode): 2.4 cm LEFT ATRIUM             Index       RIGHT ATRIUM           Index LA diam:        3.70 cm 1.90 cm/m  RA Area:     14.70 cm LA Vol (A2C):   79.8 ml 41.00 ml/m RA Volume:   35.90 ml  18.45 ml/m LA Vol (A4C):   53.1 ml 27.28 ml/m LA Biplane Vol: 68.5 ml 35.19 ml/m  AORTIC VALVE LVOT Vmax:   79.80 cm/s LVOT Vmean:  44.900 cm/s LVOT VTI:    0.172 m  AORTA Ao Root diam: 3.30 cm Ao Asc diam:  3.15 cm MITRAL VALVE               TRICUSPID VALVE MV Area (PHT): 1.75 cm    TR Peak grad:   22.5 mmHg MV Decel Time: 433 msec    TR Vmax:        237.00 cm/s MV E velocity: 67.40 cm/s MV A velocity: 88.80 cm/s  SHUNTS MV E/A ratio:  0.76        Systemic VTI:  0.17 m                            Systemic Diam: 2.30 cm Ena Dawley MD Electronically signed by Ena Dawley MD Signature Date/Time: 07/29/2019/7:44:13  PM    Final    CT HEAD CODE STROKE WO CONTRAST  Result Date: 07/29/2019 CLINICAL DATA:  Code stroke. Right-sided weakness and right facial droop. 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 EXAM: CT HEAD WITHOUT CONTRAST TECHNIQUE:  Contiguous axial images were obtained from the base of the skull through the vertex without intravenous contrast. COMPARISON:  None. FINDINGS: Brain: Hypodensity in the left temporal lobe compatible with acute infarct. No associated hemorrhage. Mild atrophy and mild chronic microvascular ischemic changes in the white matter. Negative for mass lesion. Vascular: Negative for hyperdense vessel Skull: 1 cm lucency in the right parietal bone over the convexity. No other skeletal lesion identified. Sinuses/Orbits: Mild mucosal edema paranasal sinuses. Normal orbit bilaterally. Other: None ASPECTS ([place] Stroke Program Early CT Score) - Ganglionic level infarction (caudate, lentiform nuclei, internal capsule, insula, M1-M3 cortex): 6 - Supraganglionic infarction (M4-M6 cortex): 3 Total score (0-10 with 10 being normal): 9 IMPRESSION: 1. Acute infarct left temporal lobe without hemorrhage. 2. ASPECTS is 9 3. Results texted to Dr. Leonel Ramsay Electronically Signed   By: Franchot Gallo M.D.   On: 07/29/2019 12:20   VAS US CAROTID  Result Date: 07/30/2019 Carotid Arterial Duplex Study Indications:       Carotid artery disease. Other Factors:     CTA 07/29/19 fibromuscular displasia bilateral ICAs. Right no                    stenosis. Left 25% narrowing. Comparison Study:  06/10/19 right 16-49% left 50-69% (at outside facility) Performing Technologist: June Leap RDMS, RVT  Examination Guidelines: A complete evaluation includes B-mode imaging, spectral Doppler, color Doppler, and power Doppler as needed of all accessible portions of each vessel. Bilateral testing is considered an integral part of a complete examination. Limited examinations for reoccurring indications may be performed as noted.  Right Carotid Findings: +----------+--------+--------+--------+----------------------+--------+           PSV cm/sEDV cm/sStenosisPlaque Description    Comments  +----------+--------+--------+--------+----------------------+--------+ CCA Prox  81      10                                             +----------+--------+--------+--------+----------------------+--------+ CCA Distal73      12                                             +----------+--------+--------+--------+----------------------+--------+ ICA Prox  133     29      1-39%   homogeneous and smooth         +----------+--------+--------+--------+----------------------+--------+ ICA Mid   111     21                                             +----------+--------+--------+--------+----------------------+--------+ ICA Distal138     29                                             +----------+--------+--------+--------+----------------------+--------+ ECA       163     17                                             +----------+--------+--------+--------+----------------------+--------+ +----------+--------+-------+----------------+-------------------+  PSV cm/sEDV cmsDescribe        Arm Pressure (mmHG) +----------+--------+-------+----------------+-------------------+ [id number]            Multiphasic, WNL                    +----------+--------+-------+----------------+-------------------+ +---------+--------+--+--------+--+---------+ VertebralPSV cm/s86EDV cm/s19Antegrade +---------+--------+--+--------+--+---------+  Left Carotid Findings: +----------+--------+--------+--------+------------------+---------------------+           PSV cm/sEDV cm/sStenosisPlaque DescriptionComments              +----------+--------+--------+--------+------------------+---------------------+ CCA Prox  121     17                                                      +----------+--------+--------+--------+------------------+---------------------+ CCA Distal64      14                                                       +----------+--------+--------+--------+------------------+---------------------+ ICA Prox  99      22      1-39%                                           +----------+--------+--------+--------+------------------+---------------------+ ICA Mid   140     37                                elevated velocities                                                       mid ICA               +----------+--------+--------+--------+------------------+---------------------+ ICA Distal200     42                                                      +----------+--------+--------+--------+------------------+---------------------+ ECA       119     13                                                      +----------+--------+--------+--------+------------------+---------------------+ +----------+--------+--------+----------------+-------------------+           PSV cm/sEDV cm/sDescribe        Arm Pressure (mmHG) +----------+--------+--------+----------------+-------------------+ [id number]             Multiphasic, WNL                    +----------+--------+--------+----------------+-------------------+ +---------+--------+--+--------+--+---------+ VertebralPSV cm/s85EDV cm/s23Antegrade +---------+--------+--+--------+--+---------+   Summary: Right Carotid: Velocities in the right ICA are consistent with a 1-39% stenosis. Left Carotid: Velocities in the left  ICA are consistent with a 1-39% stenosis.               Elevated velocities noted in the mid ICA. Vertebrals:  Bilateral vertebral arteries demonstrate antegrade flow. Subclavians: Normal flow hemodynamics were seen in bilateral subclavian              arteries. *See table(s) above for measurements and observations.  Electronically signed by Antony Contras MD on 07/30/2019 at 1:49:42 PM.    Final    ECHOCARDIOGRAM LIMITED  Result Date: 07/30/2019    ECHOCARDIOGRAM LIMITED REPORT   Patient Name:   DERECK NEENAN Date of Exam:  07/30/2019 Medical Rec #:  [id number]      Height:       69.0 in Accession #:    [id number]     Weight:       173.8 lb Date of Birth:  1941-03-09     BSA:          1.946 m Patient Age:    10 years       BP:           146/131 mmHg Patient Gender: M              HR:           51 bpm. Exam Location:  Inpatient Procedure: Intracardiac Opacification Agent and Limited Echo Indications:    Stroke 434.91 / I163.9  History:        Patient has prior history of Echocardiogram examinations, most                 recent 07/29/2019. CHF and Cardiomyopathy, CAD, PAD and Carotid                 Disease; Risk Factors:Hypertension and Dyslipidemia.  Sonographer:    Jonelle Sidle Dance Referring Phys: [place]  1. Limited f/u study with definity Moderate LVE with septal and apical akinesis EF 40-45% no mural apical thrombus. FINDINGS  Left Ventricle: Definity contrast agent was given IV to delineate the left ventricular endocardial borders. Additional Comments: Limited f/u study with definity Moderate LVE with septal and apical akinesis EF 40-45% no mural apical thrombus. Jenkins Rouge MD Electronically signed by Jenkins Rouge MD Signature Date/Time: 07/30/2019/11:16:26 AM    Final     Scheduled Meds: .  stroke: mapping our early stages of recovery book   Does not apply Once  . aspirin EC  325 mg Oral Daily  . clopidogrel  75 mg Oral Daily  . enoxaparin (LOVENOX) injection  40 mg Subcutaneous Q24H  . ezetimibe  10 mg Oral Daily  . ferrous sulfate  325 mg Oral Q breakfast  . multivitamin with minerals  1 tablet Oral Daily  . rosuvastatin  20 mg Oral Daily   Continuous Infusions:   LOS: 1 day   Time spent: 25 minutes.  Patrecia Pour, MD [facility] [email or web address] 07/30/2019, 6:39 PM

## 2019-07-30 NOTE — ED Notes (Signed)
Lunch Tray Ordered @ 1047. 

## 2019-07-30 NOTE — ED Notes (Signed)
Patient glucose noted to be 58. Given 2 orange juices.

## 2019-07-30 NOTE — Progress Notes (Signed)
  Echocardiogram 2D Echocardiogram has been performed.  Dion Sibal G Rayshell Goecke 07/30/2019, 11:10 AM

## 2019-07-30 NOTE — ED Notes (Signed)
PO fluids offered to pt.  Pt declined.

## 2019-07-31 LAB — GLUCOSE, CAPILLARY: Glucose-Capillary: 87 mg/dL (ref 70–99)

## 2019-07-31 MED ORDER — CLOPIDOGREL BISULFATE 75 MG PO TABS: 75.0000 mg | ORAL_TABLET | Freq: Every day | ORAL | 0 refills | Status: DC

## 2019-07-31 MED ORDER — ASPIRIN EC 81 MG PO TBEC: 81.0000 mg | DELAYED_RELEASE_TABLET | Freq: Every day | ORAL | 0 refills | Status: AC

## 2019-07-31 NOTE — Care Management (Signed)
Spoke w patient, he declines [facility] services.

## 2019-07-31 NOTE — Progress Notes (Signed)
Pt discharge education and instructions completed with pt and daughter at bedside; both voices understanding and denies any questions. Pt IV and telemetry removed; pt discharge home with daughter to transport home. Pt to pick up electronically sent prescriptions from preferred pharmacy on file. Pt transported off unit via wheelchair with belongings and daughter to the side. Delia Heady RN

## 2019-07-31 NOTE — Progress Notes (Signed)
Occupational Therapy Evaluation Patient Details Name: Shawn Schmidt MRN: [id number] DOB: June 11, 1940 Today's Date: 07/31/2019    History of Present Illness Pt is a 79 y/o male admitted secondary to R extremity weakness. Found to have L MCA infarct. PMH includes CAD, HTN, PAD, CHF, and ischemic cardiomyopathy. MRI LMCA infarcts; small petechial hemorrhagic conversion.    Clinical Impression   Pt appears close to baseline functionally. Daughter present and will supervise IADL tasks and notify MD if their are concerns if she notices any deficits. Pt reports difficulty with his vision at baseline. Recommend follow up with his eye doctor.  No further OT needed at this time.     Follow Up Recommendations  No OT follow up;Supervision - Intermittent    Equipment Recommendations  None recommended by OT    Recommendations for Other Services       Precautions / Restrictions Precautions Precautions: Fall      Mobility Bed Mobility Overal bed mobility: Independent                Transfers Overall transfer level: Needs assistance   Transfers: Sit to/from Stand;Stand Pivot Transfers Sit to Stand: Supervision Stand pivot transfers: Supervision       General transfer comment: initially mild unsteadiness due to limited mobility    Balance Overall balance assessment: Needs assistance   Sitting balance-Leahy Scale: Good       Standing balance-Leahy Scale: Fair                             ADL either performed or assessed with clinical judgement   ADL Overall ADL's : At baseline                                       General ADL Comments: educated on fall precuations during ADL; recommend pt use his shower seat; pt/daugher verbalized understanding; pt does his own financial and medication management - recommenddtr supervise these activities to assure pt is at his baseline     Vision Baseline Vision/History: Wears glasses Wears Glasses:  Reading only Additional Comments: Pt does not report changes in vision although he states he does not see well. Recommend he see his eye doctor     Perception Perception Comments: WFL   Praxis Praxis Praxis tested?: Within functional limits    Pertinent Vitals/Pain Pain Assessment: No/denies pain     Hand Dominance Right   Extremity/Trunk Assessment Upper Extremity Assessment Upper Extremity Assessment: Overall WFL for tasks assessed   Lower Extremity Assessment Lower Extremity Assessment: Defer to PT evaluation   Cervical / Trunk Assessment Cervical / Trunk Assessment: Normal   Communication Communication Communication: No difficulties   Cognition Arousal/Alertness: Awake/alert Behavior During Therapy: WFL for tasks assessed/performed Overall Cognitive Status: Within Functional Limits for tasks assessed                                 General Comments: appears [facility] for basic ADL tasks   General Comments  Reviewed signs/symptoms of CVA using BeFast    Exercises     Shoulder Instructions      Home Living Family/patient expects to be discharged to:: Private residence Living Arrangements: Children Available Help at Discharge: Family;Available PRN/intermittently Type of Home: Mobile home Home Access: Stairs to enter Entrance Stairs-Number of Steps:  4 Entrance Stairs-Rails: Right;Left;Can reach both Home Layout: One level     Bathroom Shower/Tub: Teacher, early years/pre: Standard Bathroom Accessibility: Yes How Accessible: Accessible via walker Home Equipment: Cane - single point;Walker - 2 wheels;Shower seat          Prior Functioning/Environment Level of Independence: Independent        Comments: drives        OT Problem List: Decreased activity tolerance      OT Treatment/Interventions:      OT Goals(Current goals can be found in the care plan section) Acute Rehab OT Goals Patient Stated Goal: to go home OT Goal  Formulation: All assessment and education complete, DC therapy  OT Frequency:     Barriers to D/C:            Co-evaluation              AM-PAC OT "6 Clicks" Daily Activity     Outcome Measure Help from another person eating meals?: None Help from another person taking care of personal grooming?: None Help from another person toileting, which includes using toliet, bedpan, or urinal?: None Help from another person bathing (including washing, rinsing, drying)?: None Help from another person to put on and taking off regular upper body clothing?: None Help from another person to put on and taking off regular lower body clothing?: None 6 Click Score: 24   End of Session Equipment Utilized During Treatment: Gait belt Nurse Communication: Mobility status  Activity Tolerance: Patient tolerated treatment well Patient left: in bed;with call bell/phone within reach;with family/visitor present  OT Visit Diagnosis: Unsteadiness on feet (R26.81);Muscle weakness (generalized) (M62.81)                Time: 1010-1040 OT Time Calculation (min): 30 min Charges:  OT General Charges $OT Visit: 1 Visit OT Evaluation $OT Eval Moderate Complexity: 1 Mod OT Treatments $Self Care/Home Management : 8-22 mins  Maurie Boettcher, OT/L   Acute OT Clinical Specialist Acute Rehabilitation Services Pager [phone] Office [phone]   [facility] 07/31/2019, 10:49 AM

## 2019-07-31 NOTE — Discharge Summary (Signed)
Physician Discharge Summary  Shawn Schmidt [id number] DOB: 08-16-1940 DOA: 07/29/2019  PCP: [name], MD  Admit date: 07/29/2019 Discharge date: 07/31/2019  Admitted From: Home Disposition: Home   Recommendations for Outpatient Follow-up:  1. Follow up with PCP in 1-2 weeks 2. Please obtain BMP/CBC in one week 3. Follow up with cardiology, Dr. Einar Gip, for outpatient cardiac monitoring.   Home Health: No OT recommended, declined PT Equipment/Devices: None Discharge Condition: Stable CODE STATUS: Full Diet recommendation: Heart healthy  Brief/Interim Summary: Shawn Schmidt a 79 y.o.malewith medical history significant ofischemic cardiomyopathy, coronary artery disease, carotid stenosis, hyperlipidemia, hypertension, tobacco abuse, peripheral artery disease presents to emergency department due to right-sided weakness which started this morning. He tells me that he was doing fine last night however this morning he stated that he could not move at all.  No headache, blurry vision, slurred speech, facial droop, loss of consciousness, seizures, previous history of stroke, chest pain, shortness of breath, palpitation, leg swelling, fever, chills, nausea, vomiting, urinary or bowel changes.  He lives with his daughter at home. Smokes 1 pack of cigarettes per day, drinks alcohol occasionally. No history of illicit drug use.  ED Course:Upon arrival to ED: Patient's blood pressure in 175/63, heart rate in 50s, CBC, CMP, PT/INR, blood glucose, ethanol: WNL. UA, UDS, COVID-19 pending. Code stroke was called as patient had right-sided weakness with some expressive aphasia. CT head without contrast shows acute infarct of left temporal lobe without hemorrhage. EDP consulted neurology. [facility] hospitalist consulted for admission for stroke management.  Discharge Diagnoses:  Principal Problem:   CVA (cerebral vascular accident) ([place]) Active Problems:   Coronary artery disease    HTN (hypertension)   Hyperlipemia   Ischemic cardiomyopathy   PAD (peripheral artery disease) ([facility])   Bradycardia   AKI (acute kidney injury) ([place])  Acute infarction of left temporal lobe: Suspected to be embolic left MCA branch infarct.  - Will pursue longterm cardiac monitoring for high suspicion of AFib with cardiology at outpatient follow up.  - Previously on aspirin, will start DAPT x3 weeks, then plavix step up monotherapy alone. - Symptoms improving. No indication for NIR. - Initial echo showed no CES, but apical akinesis could present nidus for clot. Limited echocardiogram ordered with definity contrast which did not show any CES.  - Continue statin/zetia, LDL at goal 51. HbA1c 6.2% will benefit from outpatient follow up.  - Continue PT/OT  Hypertension: - Restart medications at discharge after period of permissive HTN concluded.  Ischemic cardiomyopathy/chronic congestive heart failure with reduced ejection fraction: Patient is euvolemic.Echo confirms abnormalities already known. Pt follows with Dr. Einar Gip.  - Continue home medications  Hyperlipidemia: Check lipid panel -Continue Zetia and statin  AKI: Mild - Hold nephrotoxins, monitor  Carotid artery atherosclerosis/PAD: -Continue Zetia, aspirin, statin  Tobacco abuse: -Refused nicotine patch. -Counseled about cessation  Asymptomatic bradycardia: On telemetry -Could be due to Coreg, monitor at follow up  Discharge Instructions Discharge Instructions    Diet - low sodium heart healthy   Complete by: As directed    Discharge instructions   Complete by: As directed    To reduce your risk of another stroke, please follow these recommendations:  - Continue taking BOTH aspirin and plavix for 3 more weeks, THEN take only plavix 75mg  daily (stop aspirin) - Follow up with Dr. Einar Gip as soon as possible to get a heart monitor to look for an abnormal heart rhythm that may increase your risk of strokes. - Continue  taking crestor and zetia to lower your cholesterol. - Continue taking medications for blood pressure/heart failure as you were.  - If your symptoms return, seek medical attention right away.   Increase activity slowly   Complete by: As directed      Allergies as of 07/31/2019      Reactions   Lisinopril Other (See Comments)   Hyper activity   Other    Halothane///Anesthesia - test was done to show allergy - Son had very bad experience- life threatening       Medication List    TAKE these medications   acetaminophen 500 MG tablet Commonly known as: TYLENOL Take 1,000 mg by mouth every 6 (six) hours as needed for mild pain or headache.   aspirin EC 81 MG tablet Take 1 tablet (81 mg total) by mouth daily for 21 days.   carvedilol 3.125 MG tablet Commonly known as: COREG Take 1 tablet by mouth twice daily   clopidogrel 75 MG tablet Commonly known as: PLAVIX Take 1 tablet (75 mg total) by mouth daily. Start taking on: August 01, 2019   CYANOCOBALAMIN PO Take 1,000 mcg by mouth daily. sublingual   Entresto 49-51 MG Generic drug: sacubitril-valsartan Take 1 tablet by mouth 2 (two) times daily. What changed:   when to take this  additional instructions   ezetimibe 10 MG tablet Commonly known as: ZETIA Take 1 tablet (10 mg total) by mouth daily.   ferrous sulfate 325 (65 FE) MG tablet Take 325 mg by mouth daily with breakfast.   hydrALAZINE 10 MG tablet Commonly known as: APRESOLINE Take 1 tablet (10 mg total) by mouth 3 (three) times daily.   multivitamin capsule Take 1 capsule by mouth daily.   nitroGLYCERIN 0.4 MG SL tablet Commonly known as: NITROSTAT Place 0.4 mg under the tongue every 5 (five) minutes as needed for chest pain.   rosuvastatin 20 MG tablet Commonly known as: CRESTOR Take 1 tablet by mouth once daily   spironolactone 25 MG tablet Commonly known as: ALDACTONE Take 0.5 tablets (12.5 mg total) by mouth daily.      Follow-up Information     [name], MD. Schedule an appointment as soon as possible for a visit in 1 week(s).   Specialty: Family Medicine Contact information: [place] [address] [phone]        [name], MD. Schedule an appointment as soon as possible for a visit.   Specialty: Cardiology Contact information: [place] [address] [phone]          Allergies  Allergen Reactions  . Lisinopril Other (See Comments)    Hyper activity  . Other     Halothane///Anesthesia - test was done to show allergy - Son had very bad experience- life threatening     Consultations:  Neurology  Procedures/Studies: CT Code Stroke CTA Head W/WO contrast  Result Date: 07/29/2019 CLINICAL DATA:  Stroke EXAM: CT ANGIOGRAPHY HEAD AND NECK TECHNIQUE: Multidetector CT imaging of the head and neck was performed using the standard protocol during bolus administration of intravenous contrast. Multiplanar CT image reconstructions and MIPs were obtained to evaluate the vascular anatomy. Carotid stenosis measurements (when applicable) are obtained utilizing NASCET criteria, using the distal internal carotid diameter as the denominator. CONTRAST:  49mL OMNIPAQUE IOHEXOL 350 MG/ML SOLN COMPARISON:  CT head immediately preceding the CTA. FINDINGS: CTA NECK FINDINGS Aortic arch: Atherosclerotic aortic arch. Four vessel branching of the arch with left  vertebral artery origin from the arch. Atherosclerotic disease and mild stenosis proximal left subclavian artery. Right carotid system: Atherosclerotic disease right carotid bifurcation without significant stenosis. Beading of the right internal carotid artery compatible with fibromuscular dysplasia without aneurysm or dissection. Left carotid system: Left common carotid artery widely patent. Atherosclerotic disease left carotid bifurcation. Approximately 25% diameter stenosis proximal left internal carotid artery due to  noncalcified plaque. Fibromuscular dysplasia in the left internal carotid artery without dissection or aneurysm. Vertebral arteries: Right vertebral artery is widely patent. Left vertebral artery origin from the aortic arch without significant stenosis. Skeleton: Cervical spondylosis without acute skeletal abnormality. Other neck: Negative for mass or adenopathy in the neck. Upper chest: No acute abnormality in the lung apices bilaterally. Review of the MIP images confirms the above findings CTA HEAD FINDINGS Anterior circulation: Atherosclerotic calcification in the cavernous carotid bilaterally. Mild to moderate stenosis in the cavernous carotid bilaterally. Short segment occlusion left M2 branch most likely due to clot. Left M1 segment widely patent. Right middle cerebral artery patent without significant stenosis. Posterior circulation: Both vertebral arteries patent to the basilar. PICA patent bilaterally. Basilar widely patent. Superior cerebellar and posterior cerebral arteries patent bilaterally without stenosis. Venous sinuses: Normal venous enhancement Anatomic variants: None Review of the MIP images confirms the above findings IMPRESSION: 1. Short segment occlusion left M2 branch consistent with acute thrombus. 2. Mild to moderate atherosclerotic stenosis in the cavernous carotid bilaterally. 3. Mild atherosclerotic disease carotid bifurcation bilaterally without significant stenosis. 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 4. Both vertebral arteries widely patent without stenosis. 5. These results were called by telephone at the time of interpretation on 07/29/2019 at 12:33 pm to provider MCNEILL [facility] , who verbally acknowledged these results. Electronically Signed   By: Franchot Gallo M.D.   On: 07/29/2019 12:33   CT Code Stroke CTA Neck W/WO contrast  Result Date: 07/29/2019 CLINICAL DATA:  Stroke EXAM: CT ANGIOGRAPHY HEAD AND NECK TECHNIQUE: Multidetector CT  imaging of the head and neck was performed using the standard protocol during bolus administration of intravenous contrast. Multiplanar CT image reconstructions and MIPs were obtained to evaluate the vascular anatomy. Carotid stenosis measurements (when applicable) are obtained utilizing NASCET criteria, using the distal internal carotid diameter as the denominator. CONTRAST:  60mL OMNIPAQUE IOHEXOL 350 MG/ML SOLN COMPARISON:  CT head immediately preceding the CTA. FINDINGS: CTA NECK FINDINGS Aortic arch: Atherosclerotic aortic arch. Four vessel branching of the arch with left vertebral artery origin from the arch. Atherosclerotic disease and mild stenosis proximal left subclavian artery. Right carotid system: Atherosclerotic disease right carotid bifurcation without significant stenosis. Beading of the right internal carotid artery compatible with fibromuscular dysplasia without aneurysm or dissection. Left carotid system: Left common carotid artery widely patent. Atherosclerotic disease left carotid bifurcation. Approximately 25% diameter stenosis proximal left internal carotid artery due to noncalcified plaque. Fibromuscular dysplasia in the left internal carotid artery without dissection or aneurysm. Vertebral arteries: Right vertebral artery is widely patent. Left vertebral artery origin from the aortic arch without significant stenosis. Skeleton: Cervical spondylosis without acute skeletal abnormality. Other neck: Negative for mass or adenopathy in the neck. Upper chest: No acute abnormality in the lung apices bilaterally. Review of the MIP images confirms the above findings CTA HEAD FINDINGS Anterior circulation: Atherosclerotic calcification in the cavernous carotid bilaterally. Mild to moderate stenosis in the cavernous carotid bilaterally. Short segment occlusion left M2 branch most likely due to clot. Left M1 segment widely patent. Right middle cerebral artery patent  without significant stenosis.  Posterior circulation: Both vertebral arteries patent to the basilar. PICA patent bilaterally. Basilar widely patent. Superior cerebellar and posterior cerebral arteries patent bilaterally without stenosis. Venous sinuses: Normal venous enhancement Anatomic variants: None Review of the MIP images confirms the above findings IMPRESSION: 1. Short segment occlusion left M2 branch consistent with acute thrombus. 2. Mild to moderate atherosclerotic stenosis in the cavernous carotid bilaterally. 3. Mild atherosclerotic disease carotid bifurcation bilaterally without significant stenosis. 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 4. Both vertebral arteries widely patent without stenosis. 5. These results were called by telephone at the time of interpretation on 07/29/2019 at 12:33 pm to provider MCNEILL [facility] , who verbally acknowledged these results. Electronically Signed   By: Franchot Gallo M.D.   On: 07/29/2019 12:33   MR ANGIO HEAD WO CONTRAST  Result Date: 07/29/2019 CLINICAL DATA:  Stroke, follow-up EXAM: MRI HEAD WITHOUT CONTRAST MRA HEAD WITHOUT CONTRAST TECHNIQUE: Multiplanar, multiecho pulse sequences of the brain and surrounding structures were obtained without intravenous contrast. Angiographic images of the head were obtained using MRA technique without contrast. COMPARISON:  None. FINDINGS: MRI HEAD Brain: There is cortical/subcortical restricted diffusion involving the posterior left insula and superior left temporal lobe. Additional restricted diffusion is noted spanning the posterior left lentiform nucleus to the posterior body of the caudate with involvement of intervening white matter. There is a small area of associated susceptibility in the superior left temporal lobe reflecting petechial hemorrhage. There is no intracranial mass or significant mass effect. Additional minimal patchy T2 hyperintensity in the supratentorial white matter is nonspecific but may reflect  minor chronic microvascular ischemic changes. There is no hydrocephalus or extra-axial fluid collection. Vascular: Major vessel flow voids at the skull base are preserved. Skull and upper cervical spine: No suspicious osseous lesion. Sinuses/Orbits: Trace mucosal thickening.  Orbits are unremarkable. Other: Sella is unremarkable.  Mastoid air cells are clear. MRA HEAD Intracranial internal carotid arteries are patent with atherosclerotic irregularity. There is persistent nonocclusive thrombus within a proximal left M2 MCA branch. Right middle and both anterior cerebral arteries are patent. Intracranial vertebral arteries, basilar artery, posterior cerebral arteries are patent. There is no aneurysm. IMPRESSION: Acute left MCA territory infarcts involving the superior temporal lobe, posterior insula, and basal ganglia and adjacent white matter. Small petechial hemorrhage is present. Persistent nonocclusive thrombus within a proximal left M2 MCA branch. Electronically Signed   By: Macy Mis M.D.   On: 07/29/2019 16:05   MR BRAIN WO CONTRAST  Result Date: 07/29/2019 CLINICAL DATA:  Stroke, follow-up EXAM: MRI HEAD WITHOUT CONTRAST MRA HEAD WITHOUT CONTRAST TECHNIQUE: Multiplanar, multiecho pulse sequences of the brain and surrounding structures were obtained without intravenous contrast. Angiographic images of the head were obtained using MRA technique without contrast. COMPARISON:  None. FINDINGS: MRI HEAD Brain: There is cortical/subcortical restricted diffusion involving the posterior left insula and superior left temporal lobe. Additional restricted diffusion is noted spanning the posterior left lentiform nucleus to the posterior body of the caudate with involvement of intervening white matter. There is a small area of associated susceptibility in the superior left temporal lobe reflecting petechial hemorrhage. There is no intracranial mass or significant mass effect. Additional minimal patchy T2  hyperintensity in the supratentorial white matter is nonspecific but may reflect minor chronic microvascular ischemic changes. There is no hydrocephalus or extra-axial fluid collection. Vascular: Major vessel flow voids at the skull base are preserved. Skull and upper cervical spine: No suspicious osseous lesion. Sinuses/Orbits: Trace mucosal thickening.  Orbits are unremarkable. Other: Sella is unremarkable.  Mastoid air cells are clear. MRA HEAD Intracranial internal carotid arteries are patent with atherosclerotic irregularity. There is persistent nonocclusive thrombus within a proximal left M2 MCA branch. Right middle and both anterior cerebral arteries are patent. Intracranial vertebral arteries, basilar artery, posterior cerebral arteries are patent. There is no aneurysm. IMPRESSION: Acute left MCA territory infarcts involving the superior temporal lobe, posterior insula, and basal ganglia and adjacent white matter. Small petechial hemorrhage is present. Persistent nonocclusive thrombus within a proximal left M2 MCA branch. Electronically Signed   By: Macy Mis M.D.   On: 07/29/2019 16:05   CT CEREBRAL PERFUSION W CONTRAST  Result Date: 07/29/2019 CLINICAL DATA:  Stroke EXAM: CT PERFUSION BRAIN TECHNIQUE: Multiphase CT imaging of the brain was performed following IV bolus contrast injection. Subsequent parametric perfusion maps were calculated using RAPID software. CONTRAST:  50 mL Isovue 370 IV COMPARISON:  CT a head and neck 07/29/2019 FINDINGS: CT Brain Perfusion Findings: CBF (<30%) Volume: 62mL Perfusion (Tmax>6.0s) volume: 43mL Mismatch Volume: 77mL ASPECTS on noncontrast CT Head: 9 at 12:04 p.m. today. Infarct Core: 0 mL Infarction Location:None IMPRESSION: CT perfusion negative for acute infarct or ischemia CT head earlier today reveals hypodensity in the left temporal lobe consistent with acute infarct. These results were called by telephone at the time of interpretation on 07/29/2019 at 12:45 pm  to provider MCNEILL [facility] , who verbally acknowledged these results. Electronically Signed   By: Franchot Gallo M.D.   On: 07/29/2019 12:55   ECHOCARDIOGRAM COMPLETE  Result Date: 07/29/2019    ECHOCARDIOGRAM REPORT   Patient Name:   JASKARN SUIT Date of Exam: 07/29/2019 Medical Rec #:  [id number]      Height:       69.0 in Accession #:    [id number]     Weight:       173.8 lb Date of Birth:  1941-01-04     BSA:          1.946 m Patient Age:    63 years       BP:           159/66 mmHg Patient Gender: M              HR:           45 bpm. Exam Location:  Inpatient Procedure: 2D Echo, Cardiac Doppler and Color Doppler Indications:    Stroke 434.91 / I163.9  History:        Patient has no prior history of Echocardiogram examinations.                 Cardiomyopathy and CHF, CAD, PAD; Risk Factors:Hypertension and                 Dyslipidemia.  Sonographer:    Jonelle Sidle Dance Referring Phys: [id number] [place]  1. No intracardiac source of embolism was identified, however additional images with Definity echo contrast should be performed as there is apical akinesis and an apical thrombus can't be excluded.  2. Left ventricular ejection fraction, by estimation, is 35 to 40%. The left ventricle has moderately decreased function. The left ventricle demonstrates regional wall motion abnormalities (see scoring diagram/findings for description). Left ventricular  diastolic parameters are consistent with Grade I diastolic dysfunction (impaired relaxation). Elevated left atrial pressure.  3. Right ventricular systolic function is normal. The right ventricular size is normal. There is normal pulmonary artery systolic pressure. The estimated right ventricular  systolic pressure is [id number] mmHg.  4. Left atrial size was mildly dilated.  5. The mitral valve is normal in structure. Mild mitral valve regurgitation. No evidence of mitral stenosis.  6. The aortic valve is normal in structure. Aortic valve  regurgitation is not visualized. No aortic stenosis is present.  7. The inferior vena cava is normal in size with greater than 50% respiratory variability, suggesting right atrial pressure of 3 mmHg. FINDINGS  Left Ventricle: Left ventricular ejection fraction, by estimation, is 35 to 40%. The left ventricle has moderately decreased function. The left ventricle demonstrates regional wall motion abnormalities. The left ventricular internal cavity size was normal in size. There is no left ventricular hypertrophy. Left ventricular diastolic parameters are consistent with Grade I diastolic dysfunction (impaired relaxation). Elevated left atrial pressure.  LV Wall Scoring: The mid and distal anterior wall, mid and distal anterior septum, apical inferior segment, and apex are akinetic. The mid inferoseptal segment is normal. Right Ventricle: The right ventricular size is normal. No increase in right ventricular wall thickness. Right ventricular systolic function is normal. There is normal pulmonary artery systolic pressure. The tricuspid regurgitant velocity is 2.37 m/s, and  with an assumed right atrial pressure of 3 mmHg, the estimated right ventricular systolic pressure is [id number] mmHg. Left Atrium: Left atrial size was mildly dilated. Right Atrium: Right atrial size was normal in size. Pericardium: There is no evidence of pericardial effusion. Mitral Valve: The mitral valve is normal in structure. Normal mobility of the mitral valve leaflets. Mild mitral valve regurgitation. No evidence of mitral valve stenosis. Tricuspid Valve: The tricuspid valve is normal in structure. Tricuspid valve regurgitation is mild . No evidence of tricuspid stenosis. Aortic Valve: The aortic valve is normal in structure. Aortic valve regurgitation is not visualized. No aortic stenosis is present. Pulmonic Valve: The pulmonic valve was normal in structure. Pulmonic valve regurgitation is not visualized. No evidence of pulmonic stenosis. Aorta:  The aortic root is normal in size and structure. Venous: The inferior vena cava is normal in size with greater than 50% respiratory variability, suggesting right atrial pressure of 3 mmHg. IAS/Shunts: No atrial level shunt detected by color flow Doppler.  LEFT VENTRICLE PLAX 2D LVIDd:         5.22 cm  Diastology LVIDs:         3.53 cm  LV e' lateral:   4.28 cm/s LV PW:         1.11 cm  LV E/e' lateral: 15.7 LV IVS:        0.99 cm  LV e' medial:    4.58 cm/s LVOT diam:     2.30 cm  LV E/e' medial:  14.7 LV SV:         71 LV SV Index:   37 LVOT Area:     4.15 cm  RIGHT VENTRICLE            IVC RV Basal diam:  2.50 cm    IVC diam: 1.25 cm RV S prime:     9.75 cm/s TAPSE (M-mode): 2.4 cm LEFT ATRIUM             Index       RIGHT ATRIUM           Index LA diam:        3.70 cm 1.90 cm/m  RA Area:     14.70 cm LA Vol (A2C):   79.8 ml 41.00 ml/m RA Volume:   35.90 ml  18.45 ml/m LA Vol (A4C):   53.1 ml 27.28 ml/m LA Biplane Vol: 68.5 ml 35.19 ml/m  AORTIC VALVE LVOT Vmax:   79.80 cm/s LVOT Vmean:  44.900 cm/s LVOT VTI:    0.172 m  AORTA Ao Root diam: 3.30 cm Ao Asc diam:  3.15 cm MITRAL VALVE               TRICUSPID VALVE MV Area (PHT): 1.75 cm    TR Peak grad:   22.5 mmHg MV Decel Time: 433 msec    TR Vmax:        237.00 cm/s MV E velocity: 67.40 cm/s MV A velocity: 88.80 cm/s  SHUNTS MV E/A ratio:  0.76        Systemic VTI:  0.17 m                            Systemic Diam: 2.30 cm Ena Dawley MD Electronically signed by Ena Dawley MD Signature Date/Time: 07/29/2019/7:44:13 PM    Final    CT HEAD CODE STROKE WO CONTRAST  Result Date: 07/29/2019 CLINICAL DATA:  Code stroke. Right-sided weakness and right facial droop. 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 EXAM: CT HEAD WITHOUT CONTRAST TECHNIQUE: Contiguous axial images were obtained from the base of the skull through the vertex without intravenous contrast. COMPARISON:  None. FINDINGS: Brain: Hypodensity in the left temporal lobe compatible with acute infarct. No  associated hemorrhage. Mild atrophy and mild chronic microvascular ischemic changes in the white matter. Negative for mass lesion. Vascular: Negative for hyperdense vessel Skull: 1 cm lucency in the right parietal bone over the convexity. No other skeletal lesion identified. Sinuses/Orbits: Mild mucosal edema paranasal sinuses. Normal orbit bilaterally. Other: None ASPECTS ([place] Stroke Program Early CT Score) - Ganglionic level infarction (caudate, lentiform nuclei, internal capsule, insula, M1-M3 cortex): 6 - Supraganglionic infarction (M4-M6 cortex): 3 Total score (0-10 with 10 being normal): 9 IMPRESSION: 1. Acute infarct left temporal lobe without hemorrhage. 2. ASPECTS is 9 3. Results texted to Dr. Leonel Ramsay Electronically Signed   By: Franchot Gallo M.D.   On: 07/29/2019 12:20   VAS US CAROTID  Result Date: 07/30/2019 Carotid Arterial Duplex Study Indications:       Carotid artery disease. Other Factors:     CTA 07/29/19 fibromuscular displasia bilateral ICAs. Right no                    stenosis. Left 25% narrowing. Comparison Study:  06/10/19 right 16-49% left 50-69% (at outside facility) Performing Technologist: June Leap RDMS, RVT  Examination Guidelines: A complete evaluation includes B-mode imaging, spectral Doppler, color Doppler, and power Doppler as needed of all accessible portions of each vessel. Bilateral testing is considered an integral part of a complete examination. Limited examinations for reoccurring indications may be performed as noted.  Right Carotid Findings: +----------+--------+--------+--------+----------------------+--------+           PSV cm/sEDV cm/sStenosisPlaque Description    Comments +----------+--------+--------+--------+----------------------+--------+ CCA Prox  81      10                                             +----------+--------+--------+--------+----------------------+--------+ CCA Distal73      12                                              +----------+--------+--------+--------+----------------------+--------+  ICA Prox  133     29      1-39%   homogeneous and smooth         +----------+--------+--------+--------+----------------------+--------+ ICA Mid   111     21                                             +----------+--------+--------+--------+----------------------+--------+ ICA Distal138     29                                             +----------+--------+--------+--------+----------------------+--------+ ECA       163     17                                             +----------+--------+--------+--------+----------------------+--------+ +----------+--------+-------+----------------+-------------------+           PSV cm/sEDV cmsDescribe        Arm Pressure (mmHG) +----------+--------+-------+----------------+-------------------+ [id number]            Multiphasic, WNL                    +----------+--------+-------+----------------+-------------------+ +---------+--------+--+--------+--+---------+ VertebralPSV cm/s86EDV cm/s19Antegrade +---------+--------+--+--------+--+---------+  Left Carotid Findings: +----------+--------+--------+--------+------------------+---------------------+           PSV cm/sEDV cm/sStenosisPlaque DescriptionComments              +----------+--------+--------+--------+------------------+---------------------+ CCA Prox  121     17                                                      +----------+--------+--------+--------+------------------+---------------------+ CCA Distal64      14                                                      +----------+--------+--------+--------+------------------+---------------------+ ICA Prox  99      22      1-39%                                           +----------+--------+--------+--------+------------------+---------------------+ ICA Mid   140     37                                elevated  velocities                                                       mid ICA               +----------+--------+--------+--------+------------------+---------------------+ ICA Distal200     42                                                      +----------+--------+--------+--------+------------------+---------------------+  ECA       119     13                                                      +----------+--------+--------+--------+------------------+---------------------+ +----------+--------+--------+----------------+-------------------+           PSV cm/sEDV cm/sDescribe        Arm Pressure (mmHG) +----------+--------+--------+----------------+-------------------+ [id number]             Multiphasic, WNL                    +----------+--------+--------+----------------+-------------------+ +---------+--------+--+--------+--+---------+ VertebralPSV cm/s85EDV cm/s23Antegrade +---------+--------+--+--------+--+---------+   Summary: Right Carotid: Velocities in the right ICA are consistent with a 1-39% stenosis. Left Carotid: Velocities in the left ICA are consistent with a 1-39% stenosis.               Elevated velocities noted in the mid ICA. Vertebrals:  Bilateral vertebral arteries demonstrate antegrade flow. Subclavians: Normal flow hemodynamics were seen in bilateral subclavian              arteries. *See table(s) above for measurements and observations.  Electronically signed by Antony Contras MD on 07/30/2019 at 1:49:42 PM.    Final    ECHOCARDIOGRAM LIMITED  Result Date: 07/30/2019    ECHOCARDIOGRAM LIMITED REPORT   Patient Name:   NUMA BACHMANN Date of Exam: 07/30/2019 Medical Rec #:  [id number]      Height:       69.0 in Accession #:    [id number]     Weight:       173.8 lb Date of Birth:  1940-07-29     BSA:          1.946 m Patient Age:    60 years       BP:           146/131 mmHg Patient Gender: M              HR:           51 bpm. Exam Location:  Inpatient  Procedure: Intracardiac Opacification Agent and Limited Echo Indications:    Stroke 434.91 / I163.9  History:        Patient has prior history of Echocardiogram examinations, most                 recent 07/29/2019. CHF and Cardiomyopathy, CAD, PAD and Carotid                 Disease; Risk Factors:Hypertension and Dyslipidemia.  Sonographer:    Jonelle Sidle Dance Referring Phys: [place]  1. Limited f/u study with definity Moderate LVE with septal and apical akinesis EF 40-45% no mural apical thrombus. FINDINGS  Left Ventricle: Definity contrast agent was given IV to delineate the left ventricular endocardial borders. Additional Comments: Limited f/u study with definity Moderate LVE with septal and apical akinesis EF 40-45% no mural apical thrombus. Jenkins Rouge MD Electronically signed by Jenkins Rouge MD Signature Date/Time: 07/30/2019/11:16:26 AM    Final        Subjective: No new complaints, wants to go home. Does not want ILR, but will follow up with Dr. Einar Gip.  Discharge Exam: Vitals:   07/31/19 0347 07/31/19 0751  BP: (!) 170/74 (!) 172/66  Pulse: (!) 50 (!) 51  Resp: 16  16  Temp: 97.7 F (36.5 C) 98.1 F (36.7 C)  SpO2: 97% 98%   General: Pt is alert, awake, not in acute distress Cardiovascular: RRR, S1/S2 +, no rubs, no gallops Respiratory: CTA bilaterally, no wheezing, no rhonchi Abdominal: Soft, NT, ND, bowel sounds + Extremities: No edema, no cyanosis  Labs: BNP (last 3 results) No results for input(s): BNP in the last 8760 hours. Basic Metabolic Panel: Recent Labs  Lab 07/29/19 1158 07/29/19 1206 07/29/19 1304 07/30/19 0551  NA 141 141  --  141  K 3.6 3.5  --  3.5  CL 110 108  --  109  CO2 20*  --   --  20*  GLUCOSE 105* 106*  --  68*  BUN 5* 7*  --  8  CREATININE 1.21 1.30*  --  1.29*  CALCIUM 9.1  --   --  8.9  MG  --   --  2.0  --    Liver Function Tests: Recent Labs  Lab 07/29/19 1158  AST 21  ALT 17  ALKPHOS 58  BILITOT 0.8  PROT 7.3   ALBUMIN 3.6   No results for input(s): LIPASE, AMYLASE in the last 168 hours. No results for input(s): AMMONIA in the last 168 hours. CBC: Recent Labs  Lab 07/29/19 1158 07/29/19 1206 07/30/19 0551  WBC 4.6  --  4.6  NEUTROABS 2.0  --  2.3  HGB 14.6 14.3 13.9  HCT 44.5 42.0 42.9  MCV 98.5  --  98.8  PLT 233  --  212   Cardiac Enzymes: No results for input(s): CKTOTAL, CKMB, CKMBINDEX, TROPONINI in the last 168 hours. BNP: Invalid input(s): POCBNP CBG: Recent Labs  Lab 07/29/19 1157 07/30/19 0717 07/30/19 0745 07/31/19 0748  GLUCAP 97 58* 105* 87   D-Dimer No results for input(s): DDIMER in the last 72 hours. Hgb A1c Recent Labs    07/30/19 0551  HGBA1C 6.2*   Lipid Profile Recent Labs    07/30/19 0551  CHOL 114  HDL 41  LDLCALC 51  TRIG 111  CHOLHDL 2.8   Thyroid function studies No results for input(s): TSH, T4TOTAL, T3FREE, THYROIDAB in the last 72 hours.  Invalid input(s): FREET3 Anemia work up No results for input(s): VITAMINB12, FOLATE, FERRITIN, TIBC, IRON, RETICCTPCT in the last 72 hours. Urinalysis    Component Value Date/Time   COLORURINE STRAW (A) 07/29/2019 1158   APPEARANCEUR CLEAR 07/29/2019 1158   LABSPEC 1.035 (H) 07/29/2019 1158   PHURINE 6.0 07/29/2019 1158   GLUCOSEU NEGATIVE 07/29/2019 1158   HGBUR SMALL (A) 07/29/2019 1158   BILIRUBINUR NEGATIVE 07/29/2019 1158   KETONESUR NEGATIVE 07/29/2019 1158   PROTEINUR NEGATIVE 07/29/2019 1158   NITRITE NEGATIVE 07/29/2019 1158   LEUKOCYTESUR NEGATIVE 07/29/2019 1158    Microbiology Recent Results (from the past 240 hour(s))  SARS CORONAVIRUS 2 (TAT 6-24 HRS) Nasopharyngeal Nasopharyngeal Swab     Status: None   Collection Time: 07/29/19 12:51 PM   Specimen: Nasopharyngeal Swab  Result Value Ref Range Status   SARS Coronavirus 2 NEGATIVE NEGATIVE Final    Comment: (NOTE) SARS-CoV-2 target nucleic acids are NOT DETECTED. The SARS-CoV-2 RNA is generally detectable in upper and  lower respiratory specimens during the acute phase of infection. Negative results do not preclude SARS-CoV-2 infection, do not rule out co-infections with other pathogens, and should not be used as the sole basis for treatment or other patient management decisions. Negative results must be combined with clinical observations, patient history, and epidemiological  information. The expected result is Negative. Fact Sheet for Patients: SugarRoll.be Fact Sheet for Healthcare Providers: https://www.woods-mathews.com/ This test is not yet approved or cleared by the [place] FDA and  has been authorized for detection and/or diagnosis of SARS-CoV-2 by FDA under an Emergency Use Authorization (EUA). This EUA will remain  in effect (meaning this test can be used) for the duration of the COVID-19 declaration under Section 56 4(b)(1) of the Act, 21 U.S.C. section 360bbb-3(b)(1), unless the authorization is terminated or revoked sooner. Performed at [facility], [place] [address], [place], [place] [address]     Time coordinating discharge: Approximately 40 minutes  Patrecia Pour, MD  [facility] 07/31/2019, 9:20 AM

## 2019-08-05 ENCOUNTER — Ambulatory Visit: Payer: PPO | Admitting: Cardiology

## 2019-08-05 ENCOUNTER — Encounter: Payer: Self-pay | Admitting: Cardiology

## 2019-08-05 ENCOUNTER — Other Ambulatory Visit: Payer: Self-pay

## 2019-08-05 VITALS — BP 138/78 | HR 89 | Temp 97.2°F | Resp 16 | Ht 69.0 in | Wt 176.0 lb

## 2019-08-05 DIAGNOSIS — I5042 Chronic combined systolic (congestive) and diastolic (congestive) heart failure: Secondary | ICD-10-CM | POA: Diagnosis not present

## 2019-08-05 DIAGNOSIS — Z720 Tobacco use: Secondary | ICD-10-CM

## 2019-08-05 DIAGNOSIS — E782 Mixed hyperlipidemia: Secondary | ICD-10-CM | POA: Diagnosis not present

## 2019-08-05 DIAGNOSIS — I10 Essential (primary) hypertension: Secondary | ICD-10-CM

## 2019-08-05 DIAGNOSIS — I252 Old myocardial infarction: Secondary | ICD-10-CM

## 2019-08-05 DIAGNOSIS — I255 Ischemic cardiomyopathy: Secondary | ICD-10-CM | POA: Diagnosis not present

## 2019-08-05 DIAGNOSIS — Z955 Presence of coronary angioplasty implant and graft: Secondary | ICD-10-CM | POA: Diagnosis not present

## 2019-08-05 DIAGNOSIS — I639 Cerebral infarction, unspecified: Secondary | ICD-10-CM

## 2019-08-05 DIAGNOSIS — Z09 Encounter for follow-up examination after completed treatment for conditions other than malignant neoplasm: Secondary | ICD-10-CM

## 2019-08-05 DIAGNOSIS — I6523 Occlusion and stenosis of bilateral carotid arteries: Secondary | ICD-10-CM

## 2019-08-05 DIAGNOSIS — I2510 Atherosclerotic heart disease of native coronary artery without angina pectoris: Secondary | ICD-10-CM | POA: Diagnosis not present

## 2019-08-05 DIAGNOSIS — I739 Peripheral vascular disease, unspecified: Secondary | ICD-10-CM

## 2019-08-05 MED ORDER — SPIRONOLACTONE 25 MG PO TABS: 25.0000 mg | ORAL_TABLET | Freq: Every day | ORAL | 0 refills | Status: DC

## 2019-08-05 NOTE — Progress Notes (Signed)
Shawn Schmidt Date of Birth: Mar 05, 1941 MRN: [id number] Primary Care Provider:[name], Jama Flavors, MD Former Cardiology Providers: Jeri Lager, APRN, FNP-C Primary Cardiologist: Rex Kras, DO (established care 06/30/2019)  Date: 08/05/19 Last Office Visit: June 30, 2019.  Chief Complaint  Patient presents with  . Follow-up    Hospital    HPI  Shawn Schmidt is a 79 y.o.  male who presents to the office with a chief complaint of " hospital follow-up after recent stroke." Patient's past medical history and cardiovascular risk factors include:  history of ST segment elevation MI, status post angioplasty and stenting, established CAD, peripheral artery disease, hypertension, hyperlipidemia, active smoker, acute left MCA infarcts (April 2021), advanced age.   Patient is accompanied by his daughter Shawn Schmidt at today's office visit.  Patient presents to the office earlier than his scheduled appointment after recently being hospitalized due to stroke.  Patient states that prior to going to the ER he woke up that day with decreased strength in his right side, unable to pick up things, and difficulty walking.  This was definitely not his baseline and therefore patient was taken to the hospital for further evaluation.  Upon work-up patient was noted to have acute left MCA infarcts.  He was seen by Dr. Curt Bears and given his underlying cryptogenic stroke was recommended to either undergo prolonged monitoring or loop recorder implantation.  Patient wanted to discuss it further with me prior to deciding if mobile cardiac ambulatory telemetry versus loop recorder would be a better option given his cryptogenic stroke.  Currently patient denies any chest pain or shortness of breath at rest or with effort related activities.  Patient states that he is back to his baseline without any focal deficits.  His blood pressure in the office is well controlled.  With a log that he brings in today for review his  blood pressure is labile with systolic blood pressures ranging anywhere from 130 to 158 mmHg.  ALLERGIES: Allergies  Allergen Reactions  . Lisinopril Other (See Comments)    Hyper activity  . Other     Halothane///Anesthesia - test was done to show allergy - Son had very bad experience- life threatening      MEDICATION LIST PRIOR TO VISIT: Current Outpatient Medications on File Prior to Visit  Medication Sig Dispense Refill  . acetaminophen (TYLENOL) 500 MG tablet Take 1,000 mg by mouth every 6 (six) hours as needed for mild pain or headache.    Marland Kitchen aspirin EC 81 MG tablet Take 1 tablet (81 mg total) by mouth daily for 21 days. 21 tablet 0  . carvedilol (COREG) 3.125 MG tablet Take 1 tablet by mouth twice daily 180 tablet 1  . clopidogrel (PLAVIX) 75 MG tablet Take 1 tablet (75 mg total) by mouth daily. 30 tablet 0  . CYANOCOBALAMIN PO Take 1,000 mcg by mouth daily. sublingual    . ezetimibe (ZETIA) 10 MG tablet Take 1 tablet (10 mg total) by mouth daily. 90 tablet 3  . ferrous sulfate 325 (65 FE) MG tablet Take 325 mg by mouth daily with breakfast.    . hydrALAZINE (APRESOLINE) 10 MG tablet Take 1 tablet (10 mg total) by mouth 3 (three) times daily. 90 tablet 1  . Multiple Vitamin (MULTIVITAMIN) capsule Take 1 capsule by mouth daily.    . nitroGLYCERIN (NITROSTAT) 0.4 MG SL tablet Place 0.4 mg under the tongue every 5 (five) minutes as needed for chest pain.     . rosuvastatin (CRESTOR) 20  MG tablet Take 1 tablet by mouth once daily 90 tablet 1  . sacubitril-valsartan (ENTRESTO) 49-51 MG Take 1 tablet by mouth 2 (two) times daily. 60 tablet 3   No current facility-administered medications on file prior to visit.    PAST MEDICAL HISTORY: Past Medical History:  Diagnosis Date  . Carotid artery occlusion   . CHF (congestive heart failure) ([place])   . Coronary artery disease   . HTN (hypertension) 11/27/2018  . Hyperlipemia 11/27/2018  . Hyperlipidemia   . Hypertension   . Ischemic  cardiomyopathy   . Peripheral artery disease ([place])   . Stroke (cerebrum) ([place]) 2021    PAST SURGICAL HISTORY: Past Surgical History:  Procedure Laterality Date  . CARDIAC CATHETERIZATION N/A 02/21/2015   Procedure: Left Heart Cath and Coronary Angiography;  Surgeon: [name], MD;  Location: [facility];  Service: Cardiovascular;  Laterality: N/A;  . CORONARY ANGIOPLASTY    . PERIPHERAL VASCULAR CATHETERIZATION Bilateral 04/25/2015   Procedure: Lower Extremity Angiography;  Surgeon: [name], MD;  Location: [facility];  Service: Cardiovascular;  Laterality: Bilateral;  . PERIPHERAL VASCULAR CATHETERIZATION Left 05/30/2015   Procedure: Peripheral Vascular Intervention;  Surgeon: [name], MD;  Location: [facility] [facility];  Service: Cardiovascular;  Laterality: Left;  POPLITEAL  . PERIPHERAL VASCULAR CATHETERIZATION Left 05/30/2015   Procedure: Peripheral Vascular Intervention;  Surgeon: [name], MD;  Location: [facility];  Service: Cardiovascular;  Laterality: Left;  POPLITEAL    FAMILY HISTORY: The patient's family history includes Hypertension in his father and mother.   SOCIAL HISTORY:  The patient  reports that he quit smoking about 2 weeks ago. His smoking use included cigarettes. He smoked 1.00 pack per day. He has never used smokeless tobacco. He reports current alcohol use. He reports that he does not use drugs.  Review of Systems  Constitution: Negative for chills and fever.  HENT: Negative for ear discharge, ear pain and nosebleeds.   Eyes: Negative for blurred vision and discharge.  Cardiovascular: Negative for chest pain, claudication, dyspnea on exertion, leg swelling, near-syncope, orthopnea, palpitations, paroxysmal nocturnal dyspnea and syncope.  Respiratory: Positive for shortness of breath (chronic and stable). Negative for cough.   Endocrine: Negative for polydipsia, polyphagia and polyuria.  Hematologic/Lymphatic: Negative for bleeding  problem.  Skin: Negative for flushing and nail changes.  Musculoskeletal: Negative for muscle cramps, muscle weakness and myalgias.  Gastrointestinal: Negative for abdominal pain, dysphagia, hematemesis, hematochezia, melena, nausea and vomiting.  Neurological: Negative for dizziness, focal weakness and light-headedness.    PHYSICAL EXAM: Vitals with BMI 08/05/2019 07/31/2019 07/31/2019  Height 5\' 9"  - -  Weight 176 lbs - -  BMI 123XX123 - -  Systolic 0000000 [id number]  Diastolic 78 66 74  Pulse 89 51 50   CONSTITUTIONAL: Well-developed and well-nourished. No acute distress.  SKIN: Skin is warm and dry. No rash noted. No cyanosis. No pallor. No jaundice HEAD: Normocephalic and atraumatic.  EYES: No scleral icterus MOUTH/THROAT: Moist oral membranes.  NECK: No JVD present. No thyromegaly noted.  LYMPHATIC: No visible cervical adenopathy.  CHEST Normal respiratory effort. No intercostal retractions  LUNGS: Clear to auscultation bilaterally.  No stridor. No wheezes. No rales.  CARDIOVASCULAR: Regular rate and rhythm, positive S1-S2, no murmurs rubs or gallops appreciated. ABDOMINAL: Soft, nontender, nondistended, positive bowel sounds in all 4 quadrants, no apparent ascites.  EXTREMITIES: No peripheral edema  HEMATOLOGIC: No significant bruising NEUROLOGIC: Oriented to person, place, and time. Nonfocal. Normal muscle  tone.  PSYCHIATRIC: Normal mood and affect. Normal behavior. Cooperative  RADIOLOGY: MR angio head without contrast April 15th 2021: IMPRESSION: Acute left MCA territory infarcts involving the superior temporal lobe, posterior insula, and basal ganglia and adjacent white matter. Small petechial hemorrhage is present. Persistent nonocclusive thrombus within a proximal left M2 MCA branch.  CT head code stroke without contrast 07/29/2019: IMPRESSION: 1. Acute infarct left temporal lobe without hemorrhage.   CT Code Stroke CTA Head W/WO contrast 07/29/2019: IMPRESSION: 1. Short  segment occlusion left M2 branch consistent with acute thrombus. 2. Mild to moderate atherosclerotic stenosis in the cavernous carotid bilaterally. 3. Mild atherosclerotic disease carotid bifurcation bilaterally without significant stenosis. 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 4. Both vertebral arteries widely patent without stenosis.  CARDIAC DATABASE: EKG: 06/02/2019: Sinus bradycardia at 46 bpm, normal axis, cannot exclude inferior infarct old. Deep T wave inversion in inferior, septal, and lateral leads, cannot exclude subendocardial infarct versus ischemia. Abnormal EKG. No change from EKG 11/30/2018.  Echocardiogram: 05/05/2018: LVEF 35-40%, with regional wall motion abnormalities, grade 1 diastolic impairment, normal left atrial pressure, mild to moderate TR, RVSP 23 mmHg.  07/29/2019 [facility]): LVEF 35-40%, moderately reduced left ventricular systolic function, regional wall motion abnormalities, grade 1 diastolic impairment, elevated left atrial pressure, PASP 25 mmHg, mildly dilated left atrium, mild MR.   07/30/2019: Limited study with Def[facility] at [facility]. LVEF 40-45%, no mural apical thrombus with septal and apical akinesis.  Stress Testing:  None  Heart Catheterization: Coronary Angiogram in [place] Acute anterior MI: Stenting with 3.5 x 12 mm proximal and 3.5 x 32 mm promos Premier DES in the mid LAD. 50-60% mid circumflex stenosis, nondominant right with a mid 85% stenosis.  Coronary Angiogram 02/21/2015 1. Widely patent mid LAD stent, 3.5 x 12 and 3.5 x 32 mm Promus placed on 03/12/2014 for acute MI, type III LAD which supplies essentially the entire anterolateral wall and inferior wall. 2. Moderate stenosis in the mid circumflex, 50%. Nondominant RCA. 3. Ischemic cardiomyopathy, LVEF 30-35% with mid to distal anterior anteroapical, apical, apical inferior, mid inferior severe hypokinesis to akinesis. 4. Limited abdominal  aortogram with bifemoral runoff reveals widely patent iliac vessels, profunda femoral vessel, moderately diseased bilateral internal iliac, severely diseased bilateral profunda femoral artery.  Peripheral angiography:  Peripheral arteriogram 06/27/2015:Right SFA atherectomy with Turbo Hawk followed by 6x150 mm Lutonix (DCB) angioplasty. 90% to 0%.  Left leg angioplasty 05/30/15: 1.5 mm solid crown CSI atherectomy left SFA, popliteal artery lesions and anterior tibial vessel & drug coated balloon angioplasty to the mid left SFA 95% to 0%. Stenting of proximal and mid popliteal Artery 5.0 x 100 mm Supera self-expanding stent.  Carotid duplex: 07/30/2019:Right Carotid: Velocities in the right ICA are consistent with a 1-39% stenosis. Left Carotid: Velocities in the left ICA are consistent with a 1-39% stenosis. Elevated velocities noted in the mid ICA.   Vascular imaging: Lower extremity arterial duplex 04/04/2016: No hemodynamically significant stenoses are identified in the lower extremity arterial system. There is diffuse soft plaque in the bilateral SFA. This exam reveals mildly decreased perfusion of the right lower extremity, with RABI 0.87 and moderately decreased perfusion of the left lower extremity, with LABI 0.65 noted at the dorsalis pedis artery level with biphasic waveform. The bilateral SFA angioplasty site & left popliteal artery stent is patent. Compared to the study done on 02/09/2015, ABI improved bilaterally from right 0.73 and left 0.53.  LABORATORY DATA: CBC Latest Ref Rng & Units  07/30/2019 07/29/2019 07/29/2019  WBC 4.0 - 10.5 K/uL 4.6 - 4.6  Hemoglobin 13.0 - 17.0 g/dL 13.9 14.3 14.6  Hematocrit 39.0 - 52.0 % 42.9 42.0 44.5  Platelets 150 - 400 K/uL 212 - 233    CMP Latest Ref Rng & Units 07/30/2019 07/29/2019 07/29/2019  Glucose 70 - 99 mg/dL 68(L) 106(H) 105(H)  BUN 8 - 23 mg/dL 8 7(L) 5(L)  Creatinine 0.61 - 1.24 mg/dL 1.29(H) 1.30(H) 1.21  Sodium 135 - 145 mmol/L 141  141 141  Potassium 3.5 - 5.1 mmol/L 3.5 3.5 3.6  Chloride 98 - 111 mmol/L 109 108 110  CO2 22 - 32 mmol/L 20(L) - 20(L)  Calcium 8.9 - 10.3 mg/dL 8.9 - 9.1  Total Protein 6.5 - 8.1 g/dL - - 7.3  Total Bilirubin 0.3 - 1.2 mg/dL - - 0.8  Alkaline Phos 38 - 126 U/L - - 58  AST 15 - 41 U/L - - 21  ALT 0 - 44 U/L - - 17    Lipid Panel     Component Value Date/Time   CHOL 114 07/30/2019 0551   CHOL 163 06/09/2019 1024   TRIG 111 07/30/2019 0551   HDL 41 07/30/2019 0551   HDL 65 06/09/2019 1024   CHOLHDL 2.8 07/30/2019 0551   VLDL 22 07/30/2019 0551   LDLCALC 51 07/30/2019 0551   LDLCALC 84 06/09/2019 1024   LABVLDL 14 06/09/2019 1024    Lab Results  Component Value Date   HGBA1C 6.2 (H) 07/30/2019   No components found for: NTPROBNP No results found for: TSH  Cardiac Panel (last 3 results) No results for input(s): CKTOTAL, CKMB, TROPONINIHS, RELINDX in the last 72 hours.  FINAL MEDICATION LIST END OF ENCOUNTER: Meds ordered this encounter  Medications  . spironolactone (ALDACTONE) 25 MG tablet    Sig: Take 1 tablet (25 mg total) by mouth daily.    Dispense:  90 tablet    Refill:  0    Medications Discontinued During This Encounter  Medication Reason  . spironolactone (ALDACTONE) 25 MG tablet      Current Outpatient Medications:  .  acetaminophen (TYLENOL) 500 MG tablet, Take 1,000 mg by mouth every 6 (six) hours as needed for mild pain or headache., Disp: , Rfl:  .  aspirin EC 81 MG tablet, Take 1 tablet (81 mg total) by mouth daily for 21 days., Disp: 21 tablet, Rfl: 0 .  carvedilol (COREG) 3.125 MG tablet, Take 1 tablet by mouth twice daily, Disp: 180 tablet, Rfl: 1 .  clopidogrel (PLAVIX) 75 MG tablet, Take 1 tablet (75 mg total) by mouth daily., Disp: 30 tablet, Rfl: 0 .  CYANOCOBALAMIN PO, Take 1,000 mcg by mouth daily. sublingual, Disp: , Rfl:  .  ezetimibe (ZETIA) 10 MG tablet, Take 1 tablet (10 mg total) by mouth daily., Disp: 90 tablet, Rfl: 3 .  ferrous  sulfate 325 (65 FE) MG tablet, Take 325 mg by mouth daily with breakfast., Disp: , Rfl:  .  hydrALAZINE (APRESOLINE) 10 MG tablet, Take 1 tablet (10 mg total) by mouth 3 (three) times daily., Disp: 90 tablet, Rfl: 1 .  Multiple Vitamin (MULTIVITAMIN) capsule, Take 1 capsule by mouth daily., Disp: , Rfl:  .  nitroGLYCERIN (NITROSTAT) 0.4 MG SL tablet, Place 0.4 mg under the tongue every 5 (five) minutes as needed for chest pain. , Disp: , Rfl:  .  rosuvastatin (CRESTOR) 20 MG tablet, Take 1 tablet by mouth once daily, Disp: 90 tablet, Rfl: 1 .  sacubitril-valsartan (ENTRESTO) 49-51 MG, Take 1 tablet by mouth 2 (two) times daily., Disp: 60 tablet, Rfl: 3 .  spironolactone (ALDACTONE) 25 MG tablet, Take 1 tablet (25 mg total) by mouth daily., Disp: 90 tablet, Rfl: 0  IMPRESSION:    ICD-10-CM   1. Cryptogenic stroke (HCC)  I63.9 EXTERNAL ECG MONITOR (UP TO 30 DAYS) ROCT  2. Hospital discharge follow-up  Z09   3. Bilateral carotid artery stenosis  I65.23   4. Chronic heart failure with reduced ejection fraction and diastolic dysfunction (HCC)  I50.42 spironolactone (ALDACTONE) 25 MG tablet    Basic metabolic panel    Magnesium    Magnesium    Basic metabolic panel  5. Ischemic cardiomyopathy  I25.5   6. Stented coronary artery  Z95.5   7. History of ST elevation myocardial infarction (STEMI)  I25.2   8. Atherosclerosis of native coronary artery of native heart without angina pectoris  I25.10   9. Mixed hyperlipidemia  E78.2   10. PAD (peripheral artery disease) (HCC)  I73.9   11. Essential hypertension  I10   12. Current tobacco use  Z72.0      RECOMMENDATIONS: Shawn Schmidt is a 79 y.o. male whose past medical history and cardiovascular risk factors include: history of ST segment elevation MI, status post angioplasty and stenting, established CAD, peripheral artery disease, hypertension, hyperlipidemia, active smoker, cryptogenic stroke, left MCA stroke, advanced age.  Cardiogenic  stroke:  Please refer to the recent hospitalization records for additional details.  Patient was recommended prior to discharge to undergo loop implantation versus extended mobile cardiac amatory telemetry to evaluate for underlying arrhythmic burden especially atrial fibrillation.  In the clinical setting I believe this is very appropriate to screen for atrial fibrillation given his MRI findings.  Went over the risks, benefits, alternatives of loop recorder implantation versus 30-day mobile cardiac amatory telemetry.  Patient and daughter would like to proceed with ambulatory monitoring to screen for underlying atrial fibrillation.  Patient be scheduled for a 30-day mobile cardiac ambulatory telemetry for the underlying diagnosis of cryptogenic stroke.  We will increase his spironolactone from 12.5 mg p.o. daily to 25 mg p.o. daily.  We will repeat blood work to reevaluate kidney function and electrolytes in 1 week.  Educated on the importance of secondary primary prevention.  Patient states that he does not have a neurologist.  I have asked the patient and his daughter to speak to his primary care physician for neurology consultation given his recent stroke  Chronic heart failure with reduced EF, NYHA class I/II:  Ejection Fraction noted on last 2D Echo  Recommend daily weight check, strict I/O's  Fluid restriction to <2L per day, Na restriction < 2g per day  Currently on Coreg, not uptitrating the medication secondary to hx of asymptomatic bradycardia.  In the past, patient cannot tolerate Entresto 49/51 mg p.o. twice daily.  This was causing him to be dizzy.  Patient was recommended to go on to Entresto 24/26 mg p.o. twice daily.  However, because his insurance could only approved 60 pills of Entresto at a time he decided to continue taking his Entresto 49/51 mg p.o. daily.  Since his recent hospitalization patient states that he is gone back to the original dosing of 49/51 mg twice  a day and has not had any episodes of dizziness.    In the past, discussed initiating BiDil; however, this is cost prohibitive.  Therefore he was started on hydralazine 10 mg p.o. 3 times daily.  Patient was offered to enroll into primary chronic management program available at [facility] cardiovascular for the management of his chronic congestive heart failure and hypertension.  However, patient refused.  Patient was also offered assistance program to see if Delene Loll can be covered at a lower cost to the patient but he also refused assistance in this regard as well.  Ischemic cardiomyopathy: See above  History of STEMI status post angioplasty and stent: Chronic and stable patient does not have anginal symptoms.  Continue current medical therapy  Carotid artery atherosclerosis:  Patient already has known CAD and PAD and educated on the importance of risk factor modifications.  Smoking cessation reemphasized; however, patient does not have a quit date nor does he want pharmacological therapy to help him quit smoking.  I have asked him to discuss this further with his primary care physician.  Patient is currently on Crestor and tolerating it well.  Continue Zetia 10 mg p.o. daily  Repeat carotid duplex in 6 months as recommended to reevaluate disease progression.  Mixed hyperlipidemia:  Continue statin therapy.    Continue Zetia 10 mg p.o. daily.  Patient denies myalgia or other side effects.  Active smoking:  Smoking cessation reemphasized for at least 5 minutes during this encounter; however, patient does not have a quit date nor does he want pharmacological therapy to help him quit smoking.  I have asked him to discuss this further with his primary care physician.  Orders Placed This Encounter  Procedures  . Basic metabolic panel  . Magnesium  . EXTERNAL ECG MONITOR (UP TO 30 DAYS) ROCT   --Continue cardiac medications as reconciled in final medication list. --Return in  about 6 weeks (around 09/16/2019). Or sooner if needed. --Continue follow-up with your primary care physician regarding the management of your other chronic comorbid conditions.  Patient's questions and concerns were addressed to his satisfaction. He voices understanding of the instructions provided during this encounter.   During this visit I reviewed and updated: Tobacco history  allergies medication reconciliation  medical history  surgical history  family history  social history.  This note was created using a voice recognition software as a result there may be grammatical errors inadvertently enclosed that do not reflect the nature of this encounter. Every attempt is made to correct such errors.  Rex Kras, [place], [facility]  Pager: [phone] Office: [phone]

## 2019-08-05 NOTE — Patient Instructions (Signed)
Please remember to bring in your medication bottles in at the next visit.   New Medications that were increased at today's visit:  Spironolactone 25 mg p.o. every morning  Medications that were discontinued at today's visit: None  Office will call you to have the following tests scheduled:  30-day monitor  Please get labs done in about 1 week after increasing spironolactone at the nearest [place].  If the labs were performed at primary care office please have them fax over a copy.  Recommend follow up with your PCP as scheduled.

## 2019-08-07 ENCOUNTER — Other Ambulatory Visit: Payer: Self-pay | Admitting: Cardiology

## 2019-08-10 ENCOUNTER — Other Ambulatory Visit: Payer: Self-pay

## 2019-08-10 ENCOUNTER — Ambulatory Visit: Payer: PPO

## 2019-08-10 DIAGNOSIS — I639 Cerebral infarction, unspecified: Secondary | ICD-10-CM

## 2019-08-11 ENCOUNTER — Telehealth: Payer: Self-pay

## 2019-08-11 NOTE — Telephone Encounter (Signed)
Patient returned monitor the following morning after it was placed. He refuses to continue wearing it. Says he does not like wearing it.

## 2019-08-11 NOTE — Telephone Encounter (Signed)
He is willing to be considered for having a loop recorder.

## 2019-08-11 NOTE — Telephone Encounter (Signed)
Please encourage him to wear the monitor to evaluate for arrhythmia or Afib given his recent stroke. This was the alternative to loop recorder that was recommended during the hospitalization.

## 2019-08-11 NOTE — Telephone Encounter (Signed)
Shawn Schmidt spoke with patient, and tried to encourage several times and he was insistent with his answer that he was not going to wear it.

## 2019-08-13 ENCOUNTER — Telehealth: Payer: Self-pay

## 2019-08-17 DIAGNOSIS — F17210 Nicotine dependence, cigarettes, uncomplicated: Secondary | ICD-10-CM | POA: Diagnosis not present

## 2019-08-17 DIAGNOSIS — Z7902 Long term (current) use of antithrombotics/antiplatelets: Secondary | ICD-10-CM | POA: Diagnosis not present

## 2019-08-17 DIAGNOSIS — I739 Peripheral vascular disease, unspecified: Secondary | ICD-10-CM | POA: Diagnosis not present

## 2019-08-17 DIAGNOSIS — Z7982 Long term (current) use of aspirin: Secondary | ICD-10-CM | POA: Diagnosis not present

## 2019-08-17 DIAGNOSIS — I6522 Occlusion and stenosis of left carotid artery: Secondary | ICD-10-CM | POA: Diagnosis not present

## 2019-08-17 DIAGNOSIS — I63412 Cerebral infarction due to embolism of left middle cerebral artery: Secondary | ICD-10-CM | POA: Diagnosis not present

## 2019-08-17 DIAGNOSIS — I5042 Chronic combined systolic (congestive) and diastolic (congestive) heart failure: Secondary | ICD-10-CM | POA: Diagnosis not present

## 2019-08-17 NOTE — Telephone Encounter (Signed)
Okay will discuss at the next visit

## 2019-08-17 NOTE — Telephone Encounter (Signed)
I called patient, he is insistent that he does not want to have a loop recorder placed nor wear a monitor.

## 2019-08-17 NOTE — Telephone Encounter (Signed)
Patient said absolutely not interested.

## 2019-08-26 ENCOUNTER — Other Ambulatory Visit: Payer: Self-pay | Admitting: Cardiology

## 2019-08-26 DIAGNOSIS — I639 Cerebral infarction, unspecified: Secondary | ICD-10-CM | POA: Diagnosis not present

## 2019-08-26 DIAGNOSIS — I5042 Chronic combined systolic (congestive) and diastolic (congestive) heart failure: Secondary | ICD-10-CM

## 2019-09-02 ENCOUNTER — Other Ambulatory Visit: Payer: Self-pay | Admitting: Cardiology

## 2019-09-02 DIAGNOSIS — I5042 Chronic combined systolic (congestive) and diastolic (congestive) heart failure: Secondary | ICD-10-CM

## 2019-09-03 ENCOUNTER — Other Ambulatory Visit: Payer: Self-pay

## 2019-09-16 ENCOUNTER — Encounter: Payer: Self-pay | Admitting: Cardiology

## 2019-09-16 ENCOUNTER — Ambulatory Visit: Payer: PPO | Admitting: Cardiology

## 2019-09-16 ENCOUNTER — Other Ambulatory Visit: Payer: Self-pay

## 2019-09-16 VITALS — BP 154/74 | HR 54 | Resp 16 | Ht 69.0 in | Wt 170.0 lb

## 2019-09-16 DIAGNOSIS — I639 Cerebral infarction, unspecified: Secondary | ICD-10-CM | POA: Diagnosis not present

## 2019-09-16 DIAGNOSIS — I255 Ischemic cardiomyopathy: Secondary | ICD-10-CM | POA: Diagnosis not present

## 2019-09-16 DIAGNOSIS — I252 Old myocardial infarction: Secondary | ICD-10-CM

## 2019-09-16 DIAGNOSIS — I2510 Atherosclerotic heart disease of native coronary artery without angina pectoris: Secondary | ICD-10-CM

## 2019-09-16 DIAGNOSIS — I739 Peripheral vascular disease, unspecified: Secondary | ICD-10-CM | POA: Diagnosis not present

## 2019-09-16 DIAGNOSIS — Z955 Presence of coronary angioplasty implant and graft: Secondary | ICD-10-CM

## 2019-09-16 DIAGNOSIS — Z720 Tobacco use: Secondary | ICD-10-CM | POA: Diagnosis not present

## 2019-09-16 DIAGNOSIS — I5042 Chronic combined systolic (congestive) and diastolic (congestive) heart failure: Secondary | ICD-10-CM

## 2019-09-16 DIAGNOSIS — I6523 Occlusion and stenosis of bilateral carotid arteries: Secondary | ICD-10-CM | POA: Diagnosis not present

## 2019-09-16 DIAGNOSIS — E782 Mixed hyperlipidemia: Secondary | ICD-10-CM

## 2019-09-16 DIAGNOSIS — I10 Essential (primary) hypertension: Secondary | ICD-10-CM | POA: Diagnosis not present

## 2019-09-16 MED ORDER — CLOPIDOGREL BISULFATE 75 MG PO TABS: 75.0000 mg | ORAL_TABLET | Freq: Every day | ORAL | 0 refills | Status: DC

## 2019-09-16 NOTE — Progress Notes (Signed)
Shawn Schmidt Date of Birth: 12/02/1940 MRN: [id number] Primary Care Provider:[name], [name], MD Former Cardiology Providers: Shawn Lager, APRN, FNP-C Primary Cardiologist: Shawn Kras, DO (established care 06/30/2019)  Date: 09/16/19 Last Office Visit: 08/05/2019  Chief Complaint  Patient presents with  . Congestive Heart Failure    follow up     HPI  Shawn Schmidt is a 79 y.o.  male who presents to the office with a chief complaint of " heart failure management and recent stroke." Patient's past medical history and cardiovascular risk factors include:  history of ST segment elevation MI, status post angioplasty and stenting, established CAD, ischemic cardiomyopathy, peripheral artery disease, hypertension, hyperlipidemia, active smoker, acute left MCA infarcts (April 2021), advanced age.   Patient is accompanied by his daughter Shawn Schmidt at today's office visit.  Since last office visit patient states that he doing well from a cardiovascular standpoint.  He does not have chest pain at rest or with effort related activities.  He continues to have back related dyspnea but this is chronic and stable.  He does not have orthopnea, paroxysmal nocturnal dyspnea or lower extremity swelling.  He is currently euvolemic.  Medications reconciled.  Patient did uptitrate his Aldactone to 25 mg p.o. daily as recommended at the last visit.  However, he forgot to get follow-up blood work to evaluate kidney function.   Given his recent stroke he was recommended to undergo loop implantation while he was hospitalized but he had refused.  At last office visit I had motivated him to at least try with a 30-day MCT to evaluate for atrial fibrillation.  Patient initially agreed but returned the monitoring 24 hours because he did not like wearing it.  Office called him multiple times and offered him different monitors but he continues to refuse.  The 24-hour Holter monitor results were reviewed with the patient and  her daughter at his office visit.  He is requesting a refill on Plavix which was started after his stroke during hospitalization.  I recommended that he at least establish with neurology for secondary prevention.  His blood pressure in the office is well controlled.  With a log that he brings in today for review his blood pressure is labile with systolic blood pressures ranging anywhere from 130 to 154 mmHg.  ALLERGIES: Allergies  Allergen Reactions  . Lisinopril Other (See Comments)    Hyper activity  . Other     Halothane///Anesthesia - test was done to show allergy - Son had very bad experience- life threatening      MEDICATION LIST PRIOR TO VISIT: Current Outpatient Medications on File Prior to Visit  Medication Sig Dispense Refill  . carvedilol (COREG) 3.125 MG tablet Take 1 tablet by mouth twice daily 180 tablet 1  . CYANOCOBALAMIN PO Take 1,000 mcg by mouth daily. sublingual    . ENTRESTO 49-51 MG Take 1 tablet by mouth twice daily 60 tablet 6  . ezetimibe (ZETIA) 10 MG tablet Take 1 tablet (10 mg total) by mouth daily. 90 tablet 3  . ferrous sulfate 325 (65 FE) MG tablet Take 325 mg by mouth daily with breakfast.    . hydrALAZINE (APRESOLINE) 10 MG tablet TAKE 1 TABLET BY MOUTH THREE TIMES DAILY 90 tablet 0  . Multiple Vitamin (MULTIVITAMIN) capsule Take 1 capsule by mouth daily.    . nitroGLYCERIN (NITROSTAT) 0.4 MG SL tablet Place 0.4 mg under the tongue every 5 (five) minutes as needed for chest pain.     Marland Kitchen  rosuvastatin (CRESTOR) 20 MG tablet Take 1 tablet by mouth once daily 90 tablet 1  . spironolactone (ALDACTONE) 25 MG tablet Take 1 tablet by mouth once daily 90 tablet 0   No current facility-administered medications on file prior to visit.    PAST MEDICAL HISTORY: Past Medical History:  Diagnosis Date  . Carotid artery occlusion   . CHF (congestive heart failure) ([place])   . Coronary artery disease   . HTN (hypertension) 11/27/2018  . Hyperlipemia 11/27/2018  .  Hyperlipidemia   . Hypertension   . Ischemic cardiomyopathy   . Peripheral artery disease ([place])   . Stroke due to embolism of left middle cerebral artery ([place]) 07/2019    PAST SURGICAL HISTORY: Past Surgical History:  Procedure Laterality Date  . CARDIAC CATHETERIZATION N/A 02/21/2015   Procedure: Left Heart Cath and Coronary Angiography;  Surgeon: Shawn Prows, MD;  Location: [facility];  Service: Cardiovascular;  Laterality: N/A;  . CORONARY ANGIOPLASTY    . PERIPHERAL VASCULAR CATHETERIZATION Bilateral 04/25/2015   Procedure: Lower Extremity Angiography;  Surgeon: Shawn Prows, MD;  Location: [facility];  Service: Cardiovascular;  Laterality: Bilateral;  . PERIPHERAL VASCULAR CATHETERIZATION Left 05/30/2015   Procedure: Peripheral Vascular Intervention;  Surgeon: Shawn Prows, MD;  Location: [facility];  Service: Cardiovascular;  Laterality: Left;  POPLITEAL  . PERIPHERAL VASCULAR CATHETERIZATION Left 05/30/2015   Procedure: Peripheral Vascular Intervention;  Surgeon: Shawn Prows, MD;  Location: [facility];  Service: Cardiovascular;  Laterality: Left;  POPLITEAL    FAMILY HISTORY: The patient's family history includes Hypertension in his father and mother.   SOCIAL HISTORY:  The patient  reports that he quit smoking about 8 weeks ago. His smoking use included cigarettes. He smoked 1.00 pack per day. He has never used smokeless tobacco. He reports current alcohol use. He reports that he does not use drugs.  Review of Systems  Constitution: Negative for chills and fever.  HENT: Negative for ear discharge, ear pain and nosebleeds.   Eyes: Negative for blurred vision and discharge.  Cardiovascular: Negative for chest pain, claudication, dyspnea on exertion, leg swelling, near-syncope, orthopnea, palpitations, paroxysmal nocturnal dyspnea and syncope.  Respiratory: Positive for shortness of breath (chronic and stable). Negative for cough.   Endocrine: Negative for  polydipsia, polyphagia and polyuria.  Hematologic/Lymphatic: Negative for bleeding problem.  Skin: Negative for flushing and nail changes.  Musculoskeletal: Negative for muscle cramps, muscle weakness and myalgias.  Gastrointestinal: Negative for abdominal pain, dysphagia, hematemesis, hematochezia, melena, nausea and vomiting.  Neurological: Negative for dizziness, focal weakness and light-headedness.    PHYSICAL EXAM: Vitals with BMI 09/16/2019 09/16/2019 08/05/2019  Height - 5\' 9"  5\' 9"   Weight - 170 lbs 176 lbs  BMI - [id number]  Systolic [id number]  Diastolic 74 83 78  Pulse 54 52 89   CONSTITUTIONAL: Well-developed and well-nourished. No acute distress.  SKIN: Skin is warm and dry. No rash noted. No cyanosis. No pallor. No jaundice HEAD: Normocephalic and atraumatic.  EYES: No scleral icterus MOUTH/THROAT: Moist oral membranes.  NECK: No JVD present. No thyromegaly noted.  LYMPHATIC: No visible cervical adenopathy.  CHEST Normal respiratory effort. No intercostal retractions  LUNGS: Clear to auscultation bilaterally.  No stridor. No wheezes. No rales.  CARDIOVASCULAR: Regular rate and rhythm, positive S1-S2, no murmurs rubs or gallops appreciated. ABDOMINAL: Soft, nontender, nondistended, positive bowel sounds in all 4 quadrants, no apparent ascites.  EXTREMITIES: No peripheral edema  HEMATOLOGIC: No significant  bruising NEUROLOGIC: Oriented to person, place, and time. Nonfocal. Normal muscle tone.  PSYCHIATRIC: Normal mood and affect. Normal behavior. Cooperative  RADIOLOGY: MR angio head without contrast April 15th 2021: IMPRESSION: Acute left MCA territory infarcts involving the superior temporal lobe, posterior insula, and basal ganglia and adjacent white matter. Small petechial hemorrhage is present. Persistent nonocclusive thrombus within a proximal left M2 MCA branch.  CT head code stroke without contrast 07/29/2019: IMPRESSION: 1. Acute infarct left temporal lobe  without hemorrhage.   CT Code Stroke CTA Head W/WO contrast 07/29/2019: IMPRESSION: 1. Short segment occlusion left M2 branch consistent with acute thrombus. 2. Mild to moderate atherosclerotic stenosis in the cavernous carotid bilaterally. 3. Mild atherosclerotic disease carotid bifurcation bilaterally without significant stenosis. 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 4. Both vertebral arteries widely patent without stenosis.  CARDIAC DATABASE: EKG: 06/02/2019: Sinus bradycardia at 46 bpm, normal axis, cannot exclude inferior infarct old. Deep T wave inversion in inferior, septal, and lateral leads, cannot exclude subendocardial infarct versus ischemia.   Echocardiogram: 05/05/2018: LVEF 35-40%, with regional wall motion abnormalities, grade 1 diastolic impairment, normal left atrial pressure, mild to moderate TR, RVSP 23 mmHg.  07/29/2019 [facility]): LVEF 35-40%, moderately reduced left ventricular systolic function, regional wall motion abnormalities, grade 1 diastolic impairment, elevated left atrial pressure, PASP 25 mmHg, mildly dilated left atrium, mild MR.   07/30/2019: Limited study with [facility] at [facility]. LVEF 40-45%, no mural apical thrombus with septal and apical akinesis.  Stress Testing:  None  Heart Catheterization: Coronary Angiogram in [place] Acute anterior MI: Stenting with 3.5 x 12 mm proximal and 3.5 x 32 mm promos Premier DES in the mid LAD. 50-60% mid circumflex stenosis, nondominant right with a mid 85% stenosis.  Coronary Angiogram 02/21/2015 1. Widely patent mid LAD stent, 3.5 x 12 and 3.5 x 32 mm Promus placed on 03/12/2014 for acute MI, type III LAD which supplies essentially the entire anterolateral wall and inferior wall. 2. Moderate stenosis in the mid circumflex, 50%. Nondominant RCA. 3. Ischemic cardiomyopathy, LVEF 30-35% with mid to distal anterior anteroapical, apical, apical inferior, mid inferior severe  hypokinesis to akinesis. 4. Limited abdominal aortogram with bifemoral runoff reveals widely patent iliac vessels, profunda femoral vessel, moderately diseased bilateral internal iliac, severely diseased bilateral profunda femoral artery.  Peripheral angiography:  Peripheral arteriogram 06/27/2015:Right SFA atherectomy with Turbo Hawk followed by 6x150 mm Lutonix (DCB) angioplasty. 90% to 0%.  Left leg angioplasty 05/30/15: 1.5 mm solid crown CSI atherectomy left SFA, popliteal artery lesions and anterior tibial vessel & drug coated balloon angioplasty to the mid left SFA 95% to 0%. Stenting of proximal and mid popliteal Artery 5.0 x 100 mm Supera self-expanding stent.  Carotid duplex: 07/30/2019:Right Carotid: Velocities in the right ICA are consistent with a 1-39% stenosis. Left Carotid: Velocities in the left ICA are consistent with a 1-39% stenosis. Elevated velocities noted in the mid ICA.   Vascular imaging: Lower extremity arterial duplex 04/04/2016: No hemodynamically significant stenoses are identified in the lower extremity arterial system. There is diffuse soft plaque in the bilateral SFA. This exam reveals mildly decreased perfusion of the right lower extremity, with RABI 0.87 and moderately decreased perfusion of the left lower extremity, with LABI 0.65 noted at the dorsalis pedis artery level with biphasic waveform. The bilateral SFA angioplasty site & left popliteal artery stent is patent. Compared to the study done on 02/09/2015, ABI improved bilaterally from right 0.73 and left 0.53.  24 hour Holter  monitor: Dominant rhythm normal sinus rhythm.  Heart rate 44-103 bpm.  Average heart rate 69 bpm. No atrial fibrillation or sinus pause greater than or equal to 2.5 seconds in duration. Ventricular ectopy 45 beats, with 0 ventricular pairs and 0 ventricular runs, total ventricular ectopic burden 0.2%. Supraventricular ectopy 3 beats, with 0 supraventricular runs, total  supraventricular ectopic burden 0.01%. Heart rate < 60 bpm for 23.3% of the recording. Heart rate > 100 bpm for 2.3% of the recording. No patient diary submitted as part of the study.  Patient was recommended to have a 30day mobile cardiac ambulatory telemetry; however, he refused to wear it for 30days. Therefore, changed to 24 hour holter.   LABORATORY DATA: CBC Latest Ref Rng & Units 07/30/2019 07/29/2019 07/29/2019  WBC 4.0 - 10.5 K/uL 4.6 - 4.6  Hemoglobin 13.0 - 17.0 g/dL 13.9 14.3 14.6  Hematocrit 39.0 - 52.0 % 42.9 42.0 44.5  Platelets 150 - 400 K/uL 212 - 233    CMP Latest Ref Rng & Units 07/30/2019 07/29/2019 07/29/2019  Glucose 70 - 99 mg/dL 68(L) 106(H) 105(H)  BUN 8 - 23 mg/dL 8 7(L) 5(L)  Creatinine 0.61 - 1.24 mg/dL 1.29(H) 1.30(H) 1.21  Sodium 135 - 145 mmol/L 141 141 141  Potassium 3.5 - 5.1 mmol/L 3.5 3.5 3.6  Chloride 98 - 111 mmol/L 109 108 110  CO2 22 - 32 mmol/L 20(L) - 20(L)  Calcium 8.9 - 10.3 mg/dL 8.9 - 9.1  Total Protein 6.5 - 8.1 g/dL - - 7.3  Total Bilirubin 0.3 - 1.2 mg/dL - - 0.8  Alkaline Phos 38 - 126 U/L - - 58  AST 15 - 41 U/L - - 21  ALT 0 - 44 U/L - - 17    Lipid Panel     Component Value Date/Time   CHOL 114 07/30/2019 0551   CHOL 163 06/09/2019 1024   TRIG 111 07/30/2019 0551   HDL 41 07/30/2019 0551   HDL 65 06/09/2019 1024   CHOLHDL 2.8 07/30/2019 0551   VLDL 22 07/30/2019 0551   LDLCALC 51 07/30/2019 0551   LDLCALC 84 06/09/2019 1024   LABVLDL 14 06/09/2019 1024    Lab Results  Component Value Date   HGBA1C 6.2 (H) 07/30/2019   No components found for: NTPROBNP No results found for: TSH  Cardiac Panel (last 3 results) No results for input(s): CKTOTAL, CKMB, TROPONINIHS, RELINDX in the last 72 hours.  FINAL MEDICATION LIST END OF ENCOUNTER: Meds ordered this encounter  Medications  . clopidogrel (PLAVIX) 75 MG tablet    Sig: Take 1 tablet (75 mg total) by mouth daily.    Dispense:  90 tablet    Refill:  0    Medications  Discontinued During This Encounter  Medication Reason  . acetaminophen (TYLENOL) 500 MG tablet Patient Preference  . clopidogrel (PLAVIX) 75 MG tablet Reorder     Current Outpatient Medications:  .  carvedilol (COREG) 3.125 MG tablet, Take 1 tablet by mouth twice daily, Disp: 180 tablet, Rfl: 1 .  CYANOCOBALAMIN PO, Take 1,000 mcg by mouth daily. sublingual, Disp: , Rfl:  .  ENTRESTO 49-51 MG, Take 1 tablet by mouth twice daily, Disp: 60 tablet, Rfl: 6 .  ezetimibe (ZETIA) 10 MG tablet, Take 1 tablet (10 mg total) by mouth daily., Disp: 90 tablet, Rfl: 3 .  ferrous sulfate 325 (65 FE) MG tablet, Take 325 mg by mouth daily with breakfast., Disp: , Rfl:  .  hydrALAZINE (APRESOLINE) 10 MG tablet,  TAKE 1 TABLET BY MOUTH THREE TIMES DAILY, Disp: 90 tablet, Rfl: 0 .  Multiple Vitamin (MULTIVITAMIN) capsule, Take 1 capsule by mouth daily., Disp: , Rfl:  .  nitroGLYCERIN (NITROSTAT) 0.4 MG SL tablet, Place 0.4 mg under the tongue every 5 (five) minutes as needed for chest pain. , Disp: , Rfl:  .  rosuvastatin (CRESTOR) 20 MG tablet, Take 1 tablet by mouth once daily, Disp: 90 tablet, Rfl: 1 .  spironolactone (ALDACTONE) 25 MG tablet, Take 1 tablet by mouth once daily, Disp: 90 tablet, Rfl: 0 .  clopidogrel (PLAVIX) 75 MG tablet, Take 1 tablet (75 mg total) by mouth daily., Disp: 90 tablet, Rfl: 0  IMPRESSION:    ICD-10-CM   1. Cryptogenic stroke (HCC)  I63.9 clopidogrel (PLAVIX) 75 MG tablet  2. Ischemic cardiomyopathy  I25.5   3. Chronic heart failure with reduced ejection fraction and diastolic dysfunction (HCC)  123456 Basic metabolic panel    Magnesium    Pro b natriuretic peptide (BNP)[facility])  4. Bilateral carotid artery stenosis  I65.23   5. Stented coronary artery  Z95.5   6. History of ST elevation myocardial infarction (STEMI)  I25.2   7. Atherosclerosis of native coronary artery of native heart without angina pectoris  I25.10   8. Mixed hyperlipidemia   E78.2   9. PAD (peripheral artery disease) (HCC)  I73.9   10. Essential hypertension  I10   11. Current tobacco use  Z72.0      RECOMMENDATIONS: Shawn Schmidt is a 79 y.o. male whose past medical history and cardiovascular risk factors include: history of ST segment elevation MI, status post angioplasty and stenting, established CAD, peripheral artery disease, hypertension, hyperlipidemia, active smoker, cryptogenic stroke, left MCA stroke, advanced age.  Cardiogenic stroke:  Patient has refused Loop recorder implantation in the past.  And alternatives recommended to have a 30-day or or 14-day mobile cardiac ambulatory telemetry to evaluate for atrial fibrillation.  However, he returned it in 24 hours because he did not like wearing it.  He still does not want to change his mind in regards to being reconsidered for monitor at today's visit.  Refilled Plavix  Recommended that he follows up with his primary care for consultation for neurology to help with secondary prevention given his recent cryptogenic stroke.  Chronic heart failure with reduced EF, NYHA class II:  Ejection Fraction noted on last 2D Echo  Recommend daily weight check, strict I/O's  Fluid restriction to <2L per day, Na restriction < 2g per day  Currently on Coreg, not uptitrating the medication secondary to hx of asymptomatic bradycardia.  Currently on Aldactone 25 mg p.o. daily.  We will have a BMP and magnesium level checked as he forgot to do it at last visit.  Continue Entresto 49/51 mg p.o. twice daily  Ischemic cardiomyopathy: See above  History of STEMI status post angioplasty and stent: Chronic and stable patient does not have anginal symptoms.  Continue current medical therapy  Carotid artery atherosclerosis:  Patient already has known CAD and PAD and educated on the importance of risk factor modifications.  Smoking cessation reemphasized; however, patient does not have a quit date nor does he  want pharmacological therapy to help him quit smoking.  I have asked him to discuss this further with his primary care physician.  Patient is currently on Crestor and tolerating it well.  Continue Zetia 10 mg p.o. daily  Scheduled for cardiac duplex 12/2019.   Mixed hyperlipidemia:  Continue  statin therapy.    Continue Zetia 10 mg p.o. daily.  Patient denies myalgia or other side effects.  Active smoking:  Smoking cessation reemphasized for at least 5 minutes during this encounter; however, patient does not have a quit date nor does he want pharmacological therapy to help him quit smoking.  I have asked him to discuss this further with his primary care physician.  Orders Placed This Encounter  Procedures  . Basic metabolic panel  . Magnesium  . Pro b natriuretic peptide (BNP)[facility])   --Continue cardiac medications as reconciled in final medication list. --Return in about 16 weeks (around 01/06/2020) for heart failure management., review test results.. Or sooner if needed. --Continue follow-up with your primary care physician regarding the management of your other chronic comorbid conditions.  Patient's questions and concerns were addressed to his satisfaction. He voices understanding of the instructions provided during this encounter.   During this visit I reviewed and updated: Tobacco history  allergies medication reconciliation  medical history  surgical history  family history  social history.  This note was created using a voice recognition software as a result there may be grammatical errors inadvertently enclosed that do not reflect the nature of this encounter. Every attempt is made to correct such errors.  Shawn Schmidt, [place], [facility]  Pager: [phone] Office: [phone]

## 2019-09-17 LAB — BASIC METABOLIC PANEL
BUN/Creatinine Ratio: 11 (ref 10–24)
BUN: 13 mg/dL (ref 8–27)
CO2: 23 mmol/L (ref 20–29)
Calcium: 9.7 mg/dL (ref 8.6–10.2)
Chloride: 105 mmol/L (ref 96–106)
Creatinine, Ser: 1.17 mg/dL (ref 0.76–1.27)
GFR calc Af Amer: 70 mL/min/{1.73_m2} (ref >59)
GFR calc non Af Amer: 60 mL/min/{1.73_m2} (ref >59)
Glucose: 114 mg/dL — ABNORMAL HIGH (ref 65–99)
Potassium: 4.8 mmol/L (ref 3.5–5.2)
Sodium: 141 mmol/L (ref 134–144)

## 2019-09-17 LAB — PRO B NATRIURETIC PEPTIDE: NT-Pro BNP: 974 pg/mL — ABNORMAL HIGH (ref 0–486)

## 2019-09-17 LAB — MAGNESIUM: Magnesium: 2.1 mg/dL (ref 1.6–2.3)

## 2019-09-21 ENCOUNTER — Telehealth: Payer: Self-pay

## 2019-09-22 NOTE — Telephone Encounter (Signed)
Error

## 2019-09-27 ENCOUNTER — Other Ambulatory Visit: Payer: Self-pay | Admitting: Cardiology

## 2019-09-27 DIAGNOSIS — I5042 Chronic combined systolic (congestive) and diastolic (congestive) heart failure: Secondary | ICD-10-CM

## 2019-11-01 ENCOUNTER — Other Ambulatory Visit: Payer: Self-pay | Admitting: Cardiology

## 2019-11-01 DIAGNOSIS — I5042 Chronic combined systolic (congestive) and diastolic (congestive) heart failure: Secondary | ICD-10-CM

## 2019-11-11 ENCOUNTER — Ambulatory Visit: Payer: PPO | Admitting: Neurology

## 2019-11-11 ENCOUNTER — Encounter: Payer: Self-pay | Admitting: Neurology

## 2019-11-11 VITALS — BP 118/64 | HR 54 | Ht 70.0 in | Wt 166.0 lb

## 2019-11-11 DIAGNOSIS — I639 Cerebral infarction, unspecified: Secondary | ICD-10-CM

## 2019-11-11 NOTE — Progress Notes (Signed)
[facility] [address] [place]. [place] [address] [phone]       OFFICE FOLLOW-UP NOTE  [name] Date of Birth:  [birth year] Medical Record Number:  [id number]   HPI: Shawn Schmidt is a pleasant 79 year old African-American male seen today for initial office follow-up visit following hospital admission for stroke in April 2021.  He is accompanied by his daughter Shawn Schmidt today.  History is obtained from them and review of electronic medical records and I personally reviewed available imaging films in PACS.  He has past medical history significant for cardiomyopathy, coronary artery disease, carotid stenosis, hypertension, hyperlipidemia, tobacco abuse, peripheral arterial disease who presented on 07/29/2019 with sudden onset of right-sided weakness which began the morning of admission.  He presented beyond time window for thrombolysis.  CT scan of the head showed no acute abnormality.  CT angiogram of the head and neck showed a partially occluded left M2 middle cerebral artery.  CT perfusion showed no deficit.  MRI scan showed patchy left MCA infarcts left insular and basal ganglia.  Carotid ultrasound showed no significant extracranial stenosis.  2D echo showed diminished ejection fraction of 40 to 45% but no clot.  LDL cholesterol was 51 mg percent and hemoglobin A1c was 6.2.  Patient was on aspirin prior to admission and was placed on dual antiplatelet therapy of aspirin and Plavix.  He was advised to undergo loop recorder but refused outpatient 30-day heart monitor was also advised but patient chose to do a Holter only for 24 hours which showed no significant arrhythmias.  He has done well since he has made full recovery has no right-sided deficits.  He is tolerating Plavix without bruising or bleeding.  Blood pressures well controlled today it is 118/64.  He is on both Zetia and Crestor and questions whether he needs both.  Is tolerating them without muscle aches and  pains.  He is returned back to his baseline and is able to do everything he did before his stroke.  He has no complaints.  He has cut back smoking but has not stopped it completely yet.  ROS:   14 system review of systems is positive for no complaints today.  All other systems negative  PMH:  Past Medical History:  Diagnosis Date  . Carotid artery occlusion   . CHF (congestive heart failure) ([place])   . Coronary artery disease   . HTN (hypertension) 11/27/2018  . Hyperlipemia 11/27/2018  . Hyperlipidemia   . Hypertension   . Ischemic cardiomyopathy   . Peripheral artery disease ([place])   . Stroke due to embolism of left middle cerebral artery ([place]) 07/2019    Social History:  Social History   Socioeconomic History  . Marital status: Widowed    Spouse name: Not on file  . Number of children: 2  . Years of education: Not on file  . Highest education level: Not on file  Occupational History  . Not on file  Tobacco Use  . Smoking status: Former Smoker    Packs/day: 1.00    Types: Cigarettes    Quit date: 07/22/2019    Years since quitting: 0.3  . Smokeless tobacco: Never Used  Vaping Use  . Vaping Use: Former  . Start date: 11/16/2014  . Quit date: 07/27/2017  Substance and Sexual Activity  . Alcohol use: Yes    Comment: occ  . Drug use: Never  . Sexual activity: Not on file  Other Topics Concern  . Not on file  Social History Narrative  . Not on file   Social Determinants of Health   Financial Resource Strain:   . Difficulty of Paying Living Expenses:   Food Insecurity:   . Worried About Charity fundraiser in the Last Year:   . Arboriculturist in the Last Year:   Transportation Needs:   . Film/video editor (Medical):   Marland Kitchen Lack of Transportation (Non-Medical):   Physical Activity:   . Days of Exercise per Week:   . Minutes of Exercise per Session:   Stress:   . Feeling of Stress :   Social Connections:   . Frequency of Communication with Friends and Family:     . Frequency of Social Gatherings with Friends and Family:   . Attends Religious Services:   . Active Member of Clubs or Organizations:   . Attends Archivist Meetings:   Marland Kitchen Marital Status:   Intimate Partner Violence:   . Fear of Current or Ex-Partner:   . Emotionally Abused:   Marland Kitchen Physically Abused:   . Sexually Abused:     Medications:   Current Outpatient Medications on File Prior to Visit  Medication Sig Dispense Refill  . carvedilol (COREG) 3.125 MG tablet Take 1 tablet by mouth twice daily 180 tablet 1  . clopidogrel (PLAVIX) 75 MG tablet Take 1 tablet (75 mg total) by mouth daily. 90 tablet 0  . CYANOCOBALAMIN PO Take 1,000 mcg by mouth daily. sublingual    . ENTRESTO 49-51 MG Take 1 tablet by mouth twice daily 60 tablet 6  . ferrous sulfate 325 (65 FE) MG tablet Take 325 mg by mouth daily with breakfast.    . hydrALAZINE (APRESOLINE) 10 MG tablet TAKE 1 TABLET BY MOUTH THREE TIMES DAILY 90 tablet 3  . Multiple Vitamin (MULTIVITAMIN) capsule Take 1 capsule by mouth daily.    . nitroGLYCERIN (NITROSTAT) 0.4 MG SL tablet Place 0.4 mg under the tongue every 5 (five) minutes as needed for chest pain.     . rosuvastatin (CRESTOR) 20 MG tablet Take 1 tablet by mouth once daily 90 tablet 1  . spironolactone (ALDACTONE) 25 MG tablet Take 1 tablet by mouth once daily 90 tablet 0   No current facility-administered medications on file prior to visit.    Allergies:   Allergies  Allergen Reactions  . Lisinopril Other (See Comments)    Hyper activity  . Other     Halothane///Anesthesia - test was done to show allergy - Son had very bad experience- life threatening     Physical Exam General: well developed, well nourished elderly African-American male, seated, in no evident distress Head: head normocephalic and atraumatic.  Neck: supple with no carotid or supraclavicular bruits Cardiovascular: regular rate and rhythm, no murmurs Musculoskeletal: no deformity Skin:  no  rash/petichiae Vascular:  Normal pulses all extremities Vitals:   11/11/19 0916  BP: (!) 118/64  Pulse: 54   Neurologic Exam Mental Status: Awake and fully alert. Oriented to place and time. Recent and remote memory intact. Attention span, concentration and fund of knowledge appropriate. Mood and affect appropriate.  Cranial Nerves: Fundoscopic exam reveals sharp disc margins. Pupils equal, briskly reactive to light. Extraocular movements full without nystagmus. Visual fields full to confrontation. Hearing intact. Facial sensation intact. Face, tongue, palate moves normally and symmetrically.  Motor: Normal bulk and tone. Normal strength in all tested extremity muscles.  Diminished fine finger movements on the right.  Orbits left over right upper extremity. Sensory.:  intact to touch ,pinprick .position and vibratory sensation.  Coordination: Rapid alternating movements normal in all extremities. Finger-to-nose and heel-to-shin performed accurately bilaterally. Gait and Station: Arises from chair without difficulty. Stance is normal. Gait demonstrates normal stride length and balance . Able to heel, toe and tandem walk with moderate  difficulty.  Reflexes: 1+ and symmetric. Toes downgoing.   NIHSS  0 Modified Rankin  0   ASSESSMENT: 79 year old African-American male with left MCA branch infarct in April 2021 of cryptogenic etiology.  He is doing very well with no residual deficits.  He is refusing long-term cardiac monitoring with loop recorder or event 30-day monitoring.  Vascular risk factors of smoking, hypertension, hyperlipidemia, coronary artery disease, peripheral arterial disease and cardiomyopathy     PLAN: I had a long d/w patient and his daughter about his recent stroke, risk for recurrent stroke/TIAs, personally independently reviewed imaging studies and stroke evaluation results and answered questions.Continue Plavix 75 mg daily for secondary stroke prevention and maintain  strict control of hypertension with blood pressure goal below 130/90, diabetes with hemoglobin A1c goal below 6.5% and lipids with LDL cholesterol goal below 70 mg/dL. I also advised the patient to eat a healthy diet with plenty of whole grains, cereals, fruits and vegetables, exercise regularly and maintain ideal body weight. He was advised to quit smoking. Followup in the future with my nurse practitioner Janett Billow in 6 months or call earlier if necessary. Greater than 50% of time during this 82minute visit was spent on counseling,explanation of diagnosis of cryptogenic stroke and discussion about stroke prevention, planning of further management, discussion with patient and family and coordination of care Antony Contras, MD  [facility] [address] [place], [place] [address]  Phone [phone] Fax [phone] Note: This document was prepared with digital dictation and possible smart phrase technology. Any transcriptional errors that result from this process are unintentional

## 2019-11-11 NOTE — Patient Instructions (Addendum)
Allergic to and  Stroke Prevention Some medical conditions and behaviors are associated with a higher chance of having a stroke. You can help prevent a stroke by making nutrition, lifestyle, and other changes, including managing any medical conditions you may have. What nutrition changes can be made?   Eat healthy foods. You can do this by: ? Choosing foods high in fiber, such as fresh fruits and vegetables and whole grains. ? Eating at least 5 or more servings of fruits and vegetables a day. Try to fill half of your plate at each meal with fruits and vegetables. ? Choosing lean protein foods, such as lean cuts of meat, poultry without skin, fish, tofu, beans, and nuts. ? Eating low-fat dairy products. ? Avoiding foods that are high in salt (sodium). This can help lower blood pressure. ? Avoiding foods that have saturated fat, trans fat, and cholesterol. This can help prevent high cholesterol. ? Avoiding processed and premade foods.  Follow your health care provider's specific guidelines for losing weight, controlling high blood pressure (hypertension), lowering high cholesterol, and managing diabetes. These may include: ? Reducing your daily calorie intake. ? Limiting your daily sodium intake to 1,500 milligrams (mg). ? Using only healthy fats for cooking, such as olive oil, canola oil, or sunflower oil. ? Counting your daily carbohydrate intake. What lifestyle changes can be made?  Maintain a healthy weight. Talk to your health care provider about your ideal weight.  Get at least 30 minutes of moderate physical activity at least 5 days a week. Moderate activity includes brisk walking, biking, and swimming.  Do not use any products that contain nicotine or tobacco, such as cigarettes and e-cigarettes. If you need help quitting, ask your health care provider. It may also be helpful to avoid exposure to secondhand smoke.  Limit alcohol intake to no more than 1 drink a day for nonpregnant  women and 2 drinks a day for men. One drink equals 12 oz of beer, 5 oz of wine, or 1 oz of hard liquor.  Stop any illegal drug use.  Avoid taking birth control pills. Talk to your health care provider about the risks of taking birth control pills if: ? You are over 103 years old. ? You smoke. ? You get migraines. ? You have ever had a blood clot. What other changes can be made?  Manage your cholesterol levels. ? Eating a healthy diet is important for preventing high cholesterol. If cholesterol cannot be managed through diet alone, you may also need to take medicines. ? Take any prescribed medicines to control your cholesterol as told by your health care provider.  Manage your diabetes. ? Eating a healthy diet and exercising regularly are important parts of managing your blood sugar. If your blood sugar cannot be managed through diet and exercise, you may need to take medicines. ? Take any prescribed medicines to control your diabetes as told by your health care provider.  Control your hypertension. ? To reduce your risk of stroke, try to keep your blood pressure below 130/80. ? Eating a healthy diet and exercising regularly are an important part of controlling your blood pressure. If your blood pressure cannot be managed through diet and exercise, you may need to take medicines. ? Take any prescribed medicines to control hypertension as told by your health care provider. ? Ask your health care provider if you should monitor your blood pressure at home. ? Have your blood pressure checked every year, even if your blood pressure is  normal. Blood pressure increases with age and some medical conditions.  Get evaluated for sleep disorders (sleep apnea). Talk to your health care provider about getting a sleep evaluation if you snore a lot or have excessive sleepiness.  Take over-the-counter and prescription medicines only as told by your health care provider. Aspirin or blood thinners  (antiplatelets or anticoagulants) may be recommended to reduce your risk of forming blood clots that can lead to stroke.  Make sure that any other medical conditions you have, such as atrial fibrillation or atherosclerosis, are managed. What are the warning signs of a stroke? The warning signs of a stroke can be easily remembered as BEFAST.  B is for balance. Signs include: ? Dizziness. ? Loss of balance or coordination. ? Sudden trouble walking.  E is for eyes. Signs include: ? A sudden change in vision. ? Trouble seeing.  F is for face. Signs include: ? Sudden weakness or numbness of the face. ? The face or eyelid drooping to one side.  A is for arms. Signs include: ? Sudden weakness or numbness of the arm, usually on one side of the body.  S is for speech. Signs include: ? Trouble speaking (aphasia). ? Trouble understanding.  T is for time. ? These symptoms may represent a serious problem that is an emergency. Do not wait to see if the symptoms will go away. Get medical help right away. Call your local emergency services (911 in the [place] Do not drive yourself to the hospital.  Other signs of stroke may include: ? A sudden, severe headache with no known cause. ? Nausea or vomiting. ? Seizure. Where to find more information For more information, visit:  American Stroke Association: www.strokeassociation.org  National Stroke Association: www.stroke.org Summary  You can prevent a stroke by eating healthy, exercising, not smoking, limiting alcohol intake, and managing any medical conditions you may have.  Do not use any products that contain nicotine or tobacco, such as cigarettes and e-cigarettes. If you need help quitting, ask your health care provider. It may also be helpful to avoid exposure to secondhand smoke.  Remember BEFAST for warning signs of stroke. Get help right away if you or a loved one has any of these signs. This information is not intended to replace  advice given to you by your health care provider. Make sure you discuss any questions you have with your health care provider. Document Revised: 03/14/2017 Document Reviewed: 05/07/2016 Elsevier Patient Education  2020 [employer].

## 2019-11-26 ENCOUNTER — Other Ambulatory Visit: Payer: Self-pay | Admitting: Cardiology

## 2019-11-26 DIAGNOSIS — I5042 Chronic combined systolic (congestive) and diastolic (congestive) heart failure: Secondary | ICD-10-CM

## 2019-12-06 ENCOUNTER — Other Ambulatory Visit: Payer: Self-pay | Admitting: Cardiology

## 2019-12-06 DIAGNOSIS — I639 Cerebral infarction, unspecified: Secondary | ICD-10-CM

## 2019-12-13 DIAGNOSIS — I5042 Chronic combined systolic (congestive) and diastolic (congestive) heart failure: Secondary | ICD-10-CM | POA: Diagnosis not present

## 2019-12-14 LAB — BASIC METABOLIC PANEL
BUN/Creatinine Ratio: 9 — ABNORMAL LOW (ref 10–24)
BUN: 12 mg/dL (ref 8–27)
CO2: 20 mmol/L (ref 20–29)
Calcium: 9.8 mg/dL (ref 8.6–10.2)
Chloride: 106 mmol/L (ref 96–106)
Creatinine, Ser: 1.27 mg/dL (ref 0.76–1.27)
GFR calc Af Amer: 63 mL/min/{1.73_m2} (ref >59)
GFR calc non Af Amer: 55 mL/min/{1.73_m2} — ABNORMAL LOW (ref >59)
Glucose: 107 mg/dL — ABNORMAL HIGH (ref 65–99)
Potassium: 4.8 mmol/L (ref 3.5–5.2)
Sodium: 140 mmol/L (ref 134–144)

## 2019-12-14 LAB — MAGNESIUM: Magnesium: 2.1 mg/dL (ref 1.6–2.3)

## 2019-12-21 ENCOUNTER — Ambulatory Visit: Payer: PPO

## 2019-12-21 ENCOUNTER — Other Ambulatory Visit: Payer: PPO

## 2019-12-21 ENCOUNTER — Other Ambulatory Visit: Payer: Self-pay

## 2019-12-21 DIAGNOSIS — I6522 Occlusion and stenosis of left carotid artery: Secondary | ICD-10-CM

## 2019-12-23 ENCOUNTER — Other Ambulatory Visit: Payer: Self-pay | Admitting: Cardiology

## 2019-12-23 DIAGNOSIS — I6523 Occlusion and stenosis of bilateral carotid arteries: Secondary | ICD-10-CM

## 2019-12-30 ENCOUNTER — Encounter: Payer: Self-pay | Admitting: Cardiology

## 2019-12-30 ENCOUNTER — Other Ambulatory Visit: Payer: Self-pay

## 2019-12-30 ENCOUNTER — Ambulatory Visit: Payer: PPO | Admitting: Cardiology

## 2019-12-30 VITALS — BP 139/73 | HR 48 | Resp 16 | Ht 70.0 in | Wt 168.8 lb

## 2019-12-30 DIAGNOSIS — I2510 Atherosclerotic heart disease of native coronary artery without angina pectoris: Secondary | ICD-10-CM

## 2019-12-30 DIAGNOSIS — Z720 Tobacco use: Secondary | ICD-10-CM

## 2019-12-30 DIAGNOSIS — I639 Cerebral infarction, unspecified: Secondary | ICD-10-CM | POA: Diagnosis not present

## 2019-12-30 DIAGNOSIS — I10 Essential (primary) hypertension: Secondary | ICD-10-CM

## 2019-12-30 DIAGNOSIS — E782 Mixed hyperlipidemia: Secondary | ICD-10-CM | POA: Diagnosis not present

## 2019-12-30 DIAGNOSIS — I252 Old myocardial infarction: Secondary | ICD-10-CM | POA: Diagnosis not present

## 2019-12-30 DIAGNOSIS — Z955 Presence of coronary angioplasty implant and graft: Secondary | ICD-10-CM

## 2019-12-30 DIAGNOSIS — I739 Peripheral vascular disease, unspecified: Secondary | ICD-10-CM | POA: Diagnosis not present

## 2019-12-30 DIAGNOSIS — I5042 Chronic combined systolic (congestive) and diastolic (congestive) heart failure: Secondary | ICD-10-CM

## 2019-12-30 DIAGNOSIS — I6523 Occlusion and stenosis of bilateral carotid arteries: Secondary | ICD-10-CM

## 2019-12-30 DIAGNOSIS — I255 Ischemic cardiomyopathy: Secondary | ICD-10-CM | POA: Diagnosis not present

## 2019-12-30 NOTE — Progress Notes (Signed)
Shawn Schmidt Date of Birth: 11-29-1940 MRN: [id number] Primary Care Provider:[name], Jama Flavors, MD Former Cardiology Providers: Jeri Lager, APRN, FNP-C Primary Cardiologist: Rex Kras, DO (established care 06/30/2019)  Date: 12/30/2019 Last Office Visit: 09/16/2019  Chief Complaint  Patient presents with  . HF Management  . Results    Labs  . Follow-up    3 month    HPI  Shawn Schmidt is a 79 y.o.  male who presents to the office with a chief complaint of " heart failure management and review test results unless." Patient's past medical history and cardiovascular risk factors include:  history of ST segment elevation MI, status post angioplasty and stenting, established CAD, ischemic cardiomyopathy, peripheral artery disease, hypertension, hyperlipidemia, active smoker, acute left MCA infarcts (April 2021), advanced age.   Since last office visit patient states that he doing well from a cardiovascular standpoint.  He does not have chest pain at rest or with effort related activities.  He continues to have effort related dyspnea but this is chronic and stable.  He does not have orthopnea, paroxysmal nocturnal dyspnea or lower extremity swelling.  He is currently euvolemic.  Medications reconciled.  Patient did uptitrate his Aldactone to 25 mg p.o. daily but is considering to stop the medication as it is causing him to itch.  Reviewed the side effects on the properties and informed the patient that the itching is not one of them.  I have asked her to discuss other causes of patient's with his PCP prior to stopping spironolactone.    Patient also recently had a carotid duplex performed which noted bilateral carotid artery stenosis less than 70%.  A repeat follow-up study to evaluate progression is recommended in 6 months.  Medications reconciled.  Educated on importance continue with Plavix and Crestor.  ALLERGIES: Allergies  Allergen Reactions  . Lisinopril Other (See Comments)     Hyper activity  . Other     Halothane///Anesthesia - test was done to show allergy - Son had very bad experience- life threatening      MEDICATION LIST PRIOR TO VISIT: Current Outpatient Medications on File Prior to Visit  Medication Sig Dispense Refill  . carvedilol (COREG) 3.125 MG tablet Take 1 tablet by mouth twice daily 180 tablet 1  . clopidogrel (PLAVIX) 75 MG tablet Take 1 tablet by mouth once daily 90 tablet 0  . CYANOCOBALAMIN PO Take 1,000 mcg by mouth daily. sublingual    . ENTRESTO 49-51 MG Take 1 tablet by mouth twice daily 60 tablet 6  . ferrous sulfate 325 (65 FE) MG tablet Take 325 mg by mouth daily with breakfast.    . hydrALAZINE (APRESOLINE) 10 MG tablet TAKE 1 TABLET BY MOUTH THREE TIMES DAILY 90 tablet 3  . Multiple Vitamin (MULTIVITAMIN) capsule Take 1 capsule by mouth daily.    . nitroGLYCERIN (NITROSTAT) 0.4 MG SL tablet Place 0.4 mg under the tongue every 5 (five) minutes as needed for chest pain.     . rosuvastatin (CRESTOR) 20 MG tablet Take 1 tablet by mouth once daily 90 tablet 1  . spironolactone (ALDACTONE) 25 MG tablet Take 1 tablet by mouth once daily 90 tablet 0   No current facility-administered medications on file prior to visit.    PAST MEDICAL HISTORY: Past Medical History:  Diagnosis Date  . Carotid artery occlusion   . CHF (congestive heart failure) ([place])   . Coronary artery disease   . HTN (hypertension) 11/27/2018  . Hyperlipemia 11/27/2018  .  Hyperlipidemia   . Hypertension   . Ischemic cardiomyopathy   . Peripheral artery disease ([place])   . Stroke due to embolism of left middle cerebral artery ([place]) 07/2019    PAST SURGICAL HISTORY: Past Surgical History:  Procedure Laterality Date  . CARDIAC CATHETERIZATION N/A 02/21/2015   Procedure: Left Heart Cath and Coronary Angiography;  Surgeon: [name], MD;  Location: [place] [facility];  Service: Cardiovascular;  Laterality: N/A;  . CORONARY ANGIOPLASTY    . PERIPHERAL VASCULAR  CATHETERIZATION Bilateral 04/25/2015   Procedure: Lower Extremity Angiography;  Surgeon: [name], MD;  Location: [place] [facility];  Service: Cardiovascular;  Laterality: Bilateral;  . PERIPHERAL VASCULAR CATHETERIZATION Left 05/30/2015   Procedure: Peripheral Vascular Intervention;  Surgeon: [name], MD;  Location: [place] [facility];  Service: Cardiovascular;  Laterality: Left;  POPLITEAL  . PERIPHERAL VASCULAR CATHETERIZATION Left 05/30/2015   Procedure: Peripheral Vascular Intervention;  Surgeon: [name], MD;  Location: [place] [facility];  Service: Cardiovascular;  Laterality: Left;  POPLITEAL    FAMILY HISTORY: The patient's family history includes Hypertension in his father and mother.   SOCIAL HISTORY:  The patient  reports that he quit smoking about 5 months ago. His smoking use included cigarettes. He smoked 1.00 pack per day. He has never used smokeless tobacco. He reports current alcohol use. He reports that he does not use drugs.  Review of Systems  Constitutional: Negative for chills and fever.  HENT: Negative for ear discharge, ear pain and nosebleeds.   Eyes: Negative for blurred vision and discharge.  Cardiovascular: Negative for chest pain, claudication, dyspnea on exertion, leg swelling, near-syncope, orthopnea, palpitations, paroxysmal nocturnal dyspnea and syncope.  Respiratory: Positive for shortness of breath (chronic and stable). Negative for cough.   Endocrine: Negative for polydipsia, polyphagia and polyuria.  Hematologic/Lymphatic: Negative for bleeding problem.  Skin: Negative for flushing and nail changes.  Musculoskeletal: Negative for muscle cramps, muscle weakness and myalgias.  Gastrointestinal: Negative for abdominal pain, dysphagia, nausea and vomiting.  Neurological: Negative for dizziness, focal weakness and light-headedness.    PHYSICAL EXAM: Vitals with BMI 12/30/2019 11/11/2019 09/16/2019  Height 5\' 10"  5\' 10"  -  Weight 168 lbs 13 oz 166 lbs -   BMI 29.56 21.30 -  Systolic 865 784 696  Diastolic 73 64 74  Pulse 48 54 54   CONSTITUTIONAL: Well-developed and well-nourished. No acute distress.  SKIN: Skin is warm and dry. No rash noted. No cyanosis. No pallor. No jaundice HEAD: Normocephalic and atraumatic.  EYES: No scleral icterus MOUTH/THROAT: Moist oral membranes.  NECK: No JVD present. No thyromegaly noted.  LYMPHATIC: No visible cervical adenopathy.  CHEST Normal respiratory effort. No intercostal retractions  LUNGS: Clear to auscultation bilaterally.  No stridor. No wheezes. No rales.  CARDIOVASCULAR: Regular rate and rhythm, positive S1-S2, no murmurs rubs or gallops appreciated. ABDOMINAL: Soft, nontender, nondistended, positive bowel sounds in all 4 quadrants, no apparent ascites.  EXTREMITIES: No peripheral edema  HEMATOLOGIC: No significant bruising NEUROLOGIC: Oriented to person, place, and time. Nonfocal. Normal muscle tone.  PSYCHIATRIC: Normal mood and affect. Normal behavior. Cooperative  RADIOLOGY: MR angio head without contrast April 15th 2021: IMPRESSION: Acute left MCA territory infarcts involving the superior temporal lobe, posterior insula, and basal ganglia and adjacent white matter. Small petechial hemorrhage is present. Persistent nonocclusive thrombus within a proximal left M2 MCA branch.  CT head code stroke without contrast 07/29/2019: IMPRESSION: 1. Acute infarct left temporal lobe without hemorrhage.   CT Code  Stroke CTA Head W/WO contrast 07/29/2019: IMPRESSION: 1. Short segment occlusion left M2 branch consistent with acute thrombus. 2. Mild to moderate atherosclerotic stenosis in the cavernous carotid bilaterally. 3. Mild atherosclerotic disease carotid bifurcation bilaterally without significant stenosis. 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 4. Both vertebral arteries widely patent without stenosis.  CARDIAC DATABASE: EKG: 06/02/2019: Sinus bradycardia at 46 bpm,  normal axis, cannot exclude inferior infarct old. Deep T wave inversion in inferior, septal, and lateral leads, cannot exclude subendocardial infarct versus ischemia.   Echocardiogram: 05/05/2018: LVEF 35-40%, with regional wall motion abnormalities, grade 1 diastolic impairment, normal left atrial pressure, mild to moderate TR, RVSP 23 mmHg.  07/29/2019 [facility]): LVEF 35-40%, moderately reduced left ventricular systolic function, regional wall motion abnormalities, grade 1 diastolic impairment, elevated left atrial pressure, PASP 25 mmHg, mildly dilated left atrium, mild MR.   07/30/2019: Limited study with [facility] at [facility]. LVEF 40-45%, no mural apical thrombus with septal and apical akinesis.  Stress Testing:  None  Heart Catheterization: Coronary Angiogram in [place] Acute anterior MI: Stenting with 3.5 x 12 mm proximal and 3.5 x 32 mm promos Premier DES in the mid LAD. 50-60% mid circumflex stenosis, nondominant right with a mid 85% stenosis.  Coronary Angiogram 02/21/2015 1. Widely patent mid LAD stent, 3.5 x 12 and 3.5 x 32 mm Promus placed on 03/12/2014 for acute MI, type III LAD which supplies essentially the entire anterolateral wall and inferior wall. 2. Moderate stenosis in the mid circumflex, 50%. Nondominant RCA. 3. Ischemic cardiomyopathy, LVEF 30-35% with mid to distal anterior anteroapical, apical, apical inferior, mid inferior severe hypokinesis to akinesis. 4. Limited abdominal aortogram with bifemoral runoff reveals widely patent iliac vessels, profunda femoral vessel, moderately diseased bilateral internal iliac, severely diseased bilateral profunda femoral artery.  Peripheral angiography:  Peripheral arteriogram 06/27/2015:Right SFA atherectomy with Turbo [place] followed by 6x150 mm Lutonix (DCB) angioplasty. 90% to 0%.  Left leg angioplasty 05/30/15: 1.5 mm solid crown CSI atherectomy left SFA, popliteal artery lesions and anterior tibial vessel & drug  coated balloon angioplasty to the mid left SFA 95% to 0%. Stenting of proximal and mid popliteal Artery 5.0 x 100 mm Supera self-expanding stent.  Carotid duplex: Carotid artery duplex 12/21/2019:  Stenosis in the right internal carotid artery (50-69%).  Stenosis in the left internal carotid artery (50-69%).  Antegrade right vertebral artery flow. Antegrade left vertebral artery flow.  Follow up in six months is appropriate if clinically indicated. Compared  to 06/10/2019, right ICA stenosis minimally progressed.   Vascular imaging: Lower extremity arterial duplex 04/04/2016: No hemodynamically significant stenoses are identified in the lower extremity arterial system. There is diffuse soft plaque in the bilateral SFA. This exam reveals mildly decreased perfusion of the right lower extremity, with RABI 0.87 and moderately decreased perfusion of the left lower extremity, with LABI 0.65 noted at the dorsalis pedis artery level with biphasic waveform. The bilateral SFA angioplasty site & left popliteal artery stent is patent. Compared to the study done on 02/09/2015, ABI improved bilaterally from right 0.73 and left 0.53.  24 hour Holter monitor: Dominant rhythm normal sinus rhythm.  Heart rate 44-103 bpm.  Average heart rate 69 bpm. No atrial fibrillation or sinus pause greater than or equal to 2.5 seconds in duration. Ventricular ectopy 45 beats, with 0 ventricular pairs and 0 ventricular runs, total ventricular ectopic burden 0.2%. Supraventricular ectopy 3 beats, with 0 supraventricular runs, total supraventricular ectopic burden 0.01%. Heart rate < 60 bpm  for 23.3% of the recording. Heart rate > 100 bpm for 2.3% of the recording. No patient diary submitted as part of the study.  Patient was recommended to have a 30day mobile cardiac ambulatory telemetry; however, he refused to wear it for 30days. Therefore, changed to 24 hour holter.   LABORATORY DATA: CBC Latest Ref Rng & Units  07/30/2019 07/29/2019 07/29/2019  WBC 4.0 - 10.5 K/uL 4.6 - 4.6  Hemoglobin 13.0 - 17.0 g/dL 13.9 14.3 14.6  Hematocrit 39 - 52 % 42.9 42.0 44.5  Platelets 150 - 400 K/uL 212 - 233    CMP Latest Ref Rng & Units 12/13/2019 09/16/2019 07/30/2019  Glucose 65 - 99 mg/dL 107(H) 114(H) 68(L)  BUN 8 - 27 mg/dL 12 13 8   Creatinine 0.76 - 1.27 mg/dL 1.27 1.17 1.29(H)  Sodium 134 - 144 mmol/L 140 141 141  Potassium 3.5 - 5.2 mmol/L 4.8 4.8 3.5  Chloride 96 - 106 mmol/L 106 105 109  CO2 20 - 29 mmol/L 20 23 20(L)  Calcium 8.6 - 10.2 mg/dL 9.8 9.7 8.9  Total Protein 6.5 - 8.1 g/dL - - -  Total Bilirubin 0.3 - 1.2 mg/dL - - -  Alkaline Phos 38 - 126 U/L - - -  AST 15 - 41 U/L - - -  ALT 0 - 44 U/L - - -    Lipid Panel     Component Value Date/Time   CHOL 114 07/30/2019 0551   CHOL 163 06/09/2019 1024   TRIG 111 07/30/2019 0551   HDL 41 07/30/2019 0551   HDL 65 06/09/2019 1024   CHOLHDL 2.8 07/30/2019 0551   VLDL 22 07/30/2019 0551   LDLCALC 51 07/30/2019 0551   LDLCALC 84 06/09/2019 1024   LABVLDL 14 06/09/2019 1024    Lab Results  Component Value Date   HGBA1C 6.2 (H) 07/30/2019   No components found for: NTPROBNP No results found for: TSH  Cardiac Panel (last 3 results) No results for input(s): CKTOTAL, CKMB, TROPONINIHS, RELINDX in the last 72 hours.  FINAL MEDICATION LIST END OF ENCOUNTER: No orders of the defined types were placed in this encounter.   There are no discontinued medications.   Current Outpatient Medications:  .  carvedilol (COREG) 3.125 MG tablet, Take 1 tablet by mouth twice daily, Disp: 180 tablet, Rfl: 1 .  clopidogrel (PLAVIX) 75 MG tablet, Take 1 tablet by mouth once daily, Disp: 90 tablet, Rfl: 0 .  CYANOCOBALAMIN PO, Take 1,000 mcg by mouth daily. sublingual, Disp: , Rfl:  .  ENTRESTO 49-51 MG, Take 1 tablet by mouth twice daily, Disp: 60 tablet, Rfl: 6 .  ferrous sulfate 325 (65 FE) MG tablet, Take 325 mg by mouth daily with breakfast., Disp: , Rfl:   .  hydrALAZINE (APRESOLINE) 10 MG tablet, TAKE 1 TABLET BY MOUTH THREE TIMES DAILY, Disp: 90 tablet, Rfl: 3 .  Multiple Vitamin (MULTIVITAMIN) capsule, Take 1 capsule by mouth daily., Disp: , Rfl:  .  nitroGLYCERIN (NITROSTAT) 0.4 MG SL tablet, Place 0.4 mg under the tongue every 5 (five) minutes as needed for chest pain. , Disp: , Rfl:  .  rosuvastatin (CRESTOR) 20 MG tablet, Take 1 tablet by mouth once daily, Disp: 90 tablet, Rfl: 1 .  spironolactone (ALDACTONE) 25 MG tablet, Take 1 tablet by mouth once daily, Disp: 90 tablet, Rfl: 0  IMPRESSION:    ICD-10-CM   1. Chronic heart failure with reduced ejection fraction and diastolic dysfunction (HCC)  I50.42   2.  Ischemic cardiomyopathy  I25.5   3. Stented coronary artery  Z95.5   4. History of ST elevation myocardial infarction (STEMI)  I25.2   5. Atherosclerosis of native coronary artery of native heart without angina pectoris  I25.10   6. Mixed hyperlipidemia  E78.2   7. Bilateral carotid artery stenosis  I65.23   8. Essential hypertension  I10   9. Current tobacco use  Z72.0   10. PAD (peripheral artery disease) (HCC)  I73.9   11. Cryptogenic stroke [facility])  I63.9      RECOMMENDATIONS: JAQUAN SADOWSKY is a 79 y.o. male whose past medical history and cardiovascular risk factors include: history of ST segment elevation MI, status post angioplasty and stenting, established CAD, peripheral artery disease, hypertension, hyperlipidemia, active smoker, cryptogenic stroke, left MCA stroke, advanced age.  Chronic heart failure with reduced EF, NYHA class II:  Ejection Fraction noted on last 2D Echo  Recommend daily weight check, strict I/O's  Fluid restriction to <2L per day, Na restriction < 2g per day  Currently on Coreg, not uptitrating the medication secondary to hx of asymptomatic bradycardia.  Currently on Aldactone 25 mg p.o. daily.  Patient is recommended to discuss with his PCP regarding other causes of itching prior to  attributing it to Aldactone and stopping the medication.  Patient verbalizes understanding.  Continue Entresto 49/51 mg p.o. twice daily.  Patient states that this medication may be cost prohibitive.  I have asked the office to work with him in regards to applying for patient assistance.    Ischemic cardiomyopathy: See above  History of STEMI status post angioplasty and stent: Chronic and stable patient does not have anginal symptoms.  Continue current medical therapy  Carotid artery atherosclerosis:  Patient already has known CAD and PAD and educated on the importance of risk factor modifications.  Smoking cessation  reemphasized; however, patient does not have a quit date nor does he want pharmacological therapy to help him quit smoking.  I have asked him to discuss this further with his primary care physician.  Patient is currently on Crestor and tolerating it well.  Reviewed recent carotid duplex results. 6 month follow up study recommended.    Mixed hyperlipidemia:  Continue statin therapy.    Patient denies myalgia or other side effects.  Active smoking:  Smoking cessation reemphasized for at least 5 minutes during this encounter; however, patient does not have a quit date nor does he want pharmacological therapy to help him quit smoking.  I have asked him to discuss this further with his primary care physician.  No orders of the defined types were placed in this encounter.  --Continue cardiac medications as reconciled in final medication list. --Return in about 5 months (around 06/13/2020) for heart failure and carotid artery disease management.. Or sooner if needed. --Continue follow-up with your primary care physician regarding the management of your other chronic comorbid conditions.  Patient's questions and concerns were addressed to his satisfaction. He voices understanding of the instructions provided during this encounter.   This note was created using a voice  recognition software as a result there may be grammatical errors inadvertently enclosed that do not reflect the nature of this encounter. Every attempt is made to correct such errors.  Rex Kras, [place], [facility]  Pager: [phone] Office: [phone]

## 2020-01-18 DIAGNOSIS — Z23 Encounter for immunization: Secondary | ICD-10-CM | POA: Diagnosis not present

## 2020-01-25 ENCOUNTER — Other Ambulatory Visit: Payer: Self-pay | Admitting: Cardiology

## 2020-02-28 ENCOUNTER — Other Ambulatory Visit: Payer: Self-pay | Admitting: Cardiology

## 2020-02-28 DIAGNOSIS — I639 Cerebral infarction, unspecified: Secondary | ICD-10-CM

## 2020-02-28 DIAGNOSIS — I5042 Chronic combined systolic (congestive) and diastolic (congestive) heart failure: Secondary | ICD-10-CM

## 2020-03-02 DIAGNOSIS — I2510 Atherosclerotic heart disease of native coronary artery without angina pectoris: Secondary | ICD-10-CM | POA: Diagnosis not present

## 2020-03-02 DIAGNOSIS — F17210 Nicotine dependence, cigarettes, uncomplicated: Secondary | ICD-10-CM | POA: Diagnosis not present

## 2020-03-02 DIAGNOSIS — E7800 Pure hypercholesterolemia, unspecified: Secondary | ICD-10-CM | POA: Diagnosis not present

## 2020-03-02 DIAGNOSIS — I5042 Chronic combined systolic (congestive) and diastolic (congestive) heart failure: Secondary | ICD-10-CM | POA: Diagnosis not present

## 2020-03-02 DIAGNOSIS — Z0000 Encounter for general adult medical examination without abnormal findings: Secondary | ICD-10-CM | POA: Diagnosis not present

## 2020-03-02 DIAGNOSIS — I110 Hypertensive heart disease with heart failure: Secondary | ICD-10-CM | POA: Diagnosis not present

## 2020-03-21 ENCOUNTER — Other Ambulatory Visit: Payer: Self-pay | Admitting: Cardiology

## 2020-03-28 ENCOUNTER — Other Ambulatory Visit: Payer: Self-pay | Admitting: Cardiology

## 2020-03-28 DIAGNOSIS — I5042 Chronic combined systolic (congestive) and diastolic (congestive) heart failure: Secondary | ICD-10-CM

## 2020-04-24 ENCOUNTER — Other Ambulatory Visit: Payer: Self-pay | Admitting: Cardiology

## 2020-05-04 ENCOUNTER — Other Ambulatory Visit: Payer: Self-pay | Admitting: Cardiology

## 2020-05-04 DIAGNOSIS — I5042 Chronic combined systolic (congestive) and diastolic (congestive) heart failure: Secondary | ICD-10-CM

## 2020-05-17 ENCOUNTER — Other Ambulatory Visit: Payer: Self-pay | Admitting: Cardiology

## 2020-05-19 ENCOUNTER — Ambulatory Visit: Payer: PPO

## 2020-05-19 ENCOUNTER — Other Ambulatory Visit: Payer: Self-pay

## 2020-05-19 DIAGNOSIS — I6523 Occlusion and stenosis of bilateral carotid arteries: Secondary | ICD-10-CM

## 2020-05-22 ENCOUNTER — Ambulatory Visit: Payer: PPO | Admitting: Neurology

## 2020-05-31 ENCOUNTER — Ambulatory Visit: Payer: PPO | Admitting: Cardiology

## 2020-05-31 ENCOUNTER — Encounter: Payer: Self-pay | Admitting: Cardiology

## 2020-05-31 ENCOUNTER — Other Ambulatory Visit: Payer: Self-pay

## 2020-05-31 VITALS — BP 161/73 | HR 55 | Temp 97.2°F | Resp 16 | Ht 70.0 in | Wt 173.8 lb

## 2020-05-31 DIAGNOSIS — Z720 Tobacco use: Secondary | ICD-10-CM | POA: Diagnosis not present

## 2020-05-31 DIAGNOSIS — Z955 Presence of coronary angioplasty implant and graft: Secondary | ICD-10-CM | POA: Diagnosis not present

## 2020-05-31 DIAGNOSIS — I255 Ischemic cardiomyopathy: Secondary | ICD-10-CM

## 2020-05-31 DIAGNOSIS — I10 Essential (primary) hypertension: Secondary | ICD-10-CM

## 2020-05-31 DIAGNOSIS — I6523 Occlusion and stenosis of bilateral carotid arteries: Secondary | ICD-10-CM | POA: Diagnosis not present

## 2020-05-31 DIAGNOSIS — I639 Cerebral infarction, unspecified: Secondary | ICD-10-CM

## 2020-05-31 DIAGNOSIS — I252 Old myocardial infarction: Secondary | ICD-10-CM | POA: Diagnosis not present

## 2020-05-31 DIAGNOSIS — I739 Peripheral vascular disease, unspecified: Secondary | ICD-10-CM

## 2020-05-31 DIAGNOSIS — E782 Mixed hyperlipidemia: Secondary | ICD-10-CM

## 2020-05-31 DIAGNOSIS — I5042 Chronic combined systolic (congestive) and diastolic (congestive) heart failure: Secondary | ICD-10-CM

## 2020-05-31 DIAGNOSIS — F17210 Nicotine dependence, cigarettes, uncomplicated: Secondary | ICD-10-CM | POA: Diagnosis not present

## 2020-05-31 MED ORDER — SPIRONOLACTONE 25 MG PO TABS: 25.0000 mg | ORAL_TABLET | Freq: Every morning | ORAL | 0 refills | Status: DC

## 2020-05-31 MED ORDER — ROSUVASTATIN CALCIUM 40 MG PO TABS: 40.0000 mg | ORAL_TABLET | Freq: Every evening | ORAL | 1 refills | Status: DC

## 2020-05-31 MED ORDER — HYDRALAZINE HCL 10 MG PO TABS: 10.0000 mg | ORAL_TABLET | Freq: Three times a day (TID) | ORAL | 0 refills | Status: DC

## 2020-05-31 MED ORDER — CLOPIDOGREL BISULFATE 75 MG PO TABS: 75.0000 mg | ORAL_TABLET | Freq: Every day | ORAL | 1 refills | Status: DC

## 2020-05-31 MED ORDER — ENTRESTO 49-51 MG PO TABS: 1.0000 | ORAL_TABLET | Freq: Two times a day (BID) | ORAL | 1 refills | Status: DC

## 2020-05-31 MED ORDER — CARVEDILOL 3.125 MG PO TABS: 3.1250 mg | ORAL_TABLET | Freq: Two times a day (BID) | ORAL | 0 refills | Status: DC

## 2020-05-31 NOTE — Progress Notes (Signed)
Shawn Schmidt Date of Birth: Sep 02, 1940 MRN: [id number] Primary Care Provider:[name], Jama Flavors, MD Former Cardiology Providers: Jeri Lager, APRN, FNP-C Primary Cardiologist: Rex Kras, DO (established care 06/30/2019)  Date: 05/31/20 Last Office Visit: 12/30/2019  Chief Complaint  Patient presents with  . Chronic heart failure with reduced ejection fraction and di  . Follow-up    HPI  Shawn Schmidt is a 80 y.o.  male who presents to the office with a chief complaint of " 6 month heart failure management." Patient's past medical history and cardiovascular risk factors include:  history of ST segment elevation MI, status post angioplasty and stenting, established CAD, ischemic cardiomyopathy, peripheral artery disease, hypertension, hyperlipidemia, active smoker, acute left MCA infarcts (April 2021), advanced age.   Since last office visit he states that he is doing well from a cardiovascular standpoint.  He denies any chest pain at rest or with effort related activities.  His shortness of breath remains stable, predominantly with effort related activities.  Patient states that he comes in on 1 month earlier than his scheduled appointment as he is running out of his medications and wants to be reevaluated.  Patient states that he has ran out of spironolactone and Entresto and is requesting refills on all the other medications as well.  Since last office visit he also underwent a carotid duplex at his 59-month follow-up to reevaluate bilateral carotid artery stenosis.  The severity of disease remains relatively stable without any significant improvement.  ALLERGIES: Allergies  Allergen Reactions  . Lisinopril Other (See Comments)    Hyper activity  . Other     Halothane///Anesthesia - test was done to show allergy - Son had very bad experience- life threatening      MEDICATION LIST PRIOR TO VISIT: Current Outpatient Medications on File Prior to Visit  Medication Sig Dispense  Refill  . CYANOCOBALAMIN PO Take 1,000 mcg by mouth daily. sublingual    . ferrous sulfate 325 (65 FE) MG tablet Take 325 mg by mouth daily with breakfast.    . Multiple Vitamin (MULTIVITAMIN) capsule Take 1 capsule by mouth daily.    . nitroGLYCERIN (NITROSTAT) 0.4 MG SL tablet Place 0.4 mg under the tongue every 5 (five) minutes as needed for chest pain.      No current facility-administered medications on file prior to visit.    PAST MEDICAL HISTORY: Past Medical History:  Diagnosis Date  . Carotid artery occlusion   . CHF (congestive heart failure) ([place])   . Coronary artery disease   . HTN (hypertension) 11/27/2018  . Hyperlipemia 11/27/2018  . Hyperlipidemia   . Hypertension   . Ischemic cardiomyopathy   . Peripheral artery disease ([place])   . Stroke due to embolism of left middle cerebral artery ([place]) 07/2019    PAST SURGICAL HISTORY: Past Surgical History:  Procedure Laterality Date  . CARDIAC CATHETERIZATION N/A 02/21/2015   Procedure: Left Heart Cath and Coronary Angiography;  Surgeon: [name], MD;  Location: [facility];  Service: Cardiovascular;  Laterality: N/A;  . CORONARY ANGIOPLASTY    . PERIPHERAL VASCULAR CATHETERIZATION Bilateral 04/25/2015   Procedure: Lower Extremity Angiography;  Surgeon: [name], MD;  Location: [facility];  Service: Cardiovascular;  Laterality: Bilateral;  . PERIPHERAL VASCULAR CATHETERIZATION Left 05/30/2015   Procedure: Peripheral Vascular Intervention;  Surgeon: [name], MD;  Location: [facility];  Service: Cardiovascular;  Laterality: Left;  POPLITEAL  . PERIPHERAL VASCULAR CATHETERIZATION Left 05/30/2015   Procedure: Peripheral  Vascular Intervention;  Surgeon: [name], MD;  Location: [facility];  Service: Cardiovascular;  Laterality: Left;  POPLITEAL    FAMILY HISTORY: The patient's family history includes Hypertension in his father and mother.   SOCIAL HISTORY:  The patient  reports that he has been  smoking cigarettes. He has been smoking about 1.00 pack per day. He has never used smokeless tobacco. He reports current alcohol use. He reports that he does not use drugs.  Review of Systems  Constitutional: Negative for chills and fever.  HENT: Negative for ear discharge, ear pain and nosebleeds.   Eyes: Negative for blurred vision and discharge.  Cardiovascular: Negative for chest pain, claudication, dyspnea on exertion, leg swelling, near-syncope, orthopnea, palpitations, paroxysmal nocturnal dyspnea and syncope.  Respiratory: Positive for shortness of breath (chronic and stable). Negative for cough.   Endocrine: Negative for polydipsia, polyphagia and polyuria.  Hematologic/Lymphatic: Negative for bleeding problem.  Skin: Negative for flushing and nail changes.  Musculoskeletal: Negative for muscle cramps, muscle weakness and myalgias.  Gastrointestinal: Negative for abdominal pain, dysphagia, nausea and vomiting.  Neurological: Negative for dizziness, focal weakness and light-headedness.    PHYSICAL EXAM: Vitals with BMI 05/31/2020 12/30/2019 11/11/2019  Height 5\' 10"  5\' 10"  5\' 10"   Weight 173 lbs 13 oz 168 lbs 13 oz 166 lbs  BMI 24.94 60.63 01.60  Systolic 109 323 557  Diastolic 73 73 64  Pulse 55 48 54   CONSTITUTIONAL: Well-developed and well-nourished. No acute distress.  SKIN: Skin is warm and dry. No rash noted. No cyanosis. No pallor. No jaundice HEAD: Normocephalic and atraumatic.  EYES: No scleral icterus MOUTH/THROAT: Moist oral membranes.  NECK: No JVD present. No thyromegaly noted.  LYMPHATIC: No visible cervical adenopathy.  CHEST Normal respiratory effort. No intercostal retractions  LUNGS: Clear to auscultation bilaterally.  No stridor. No wheezes. No rales.  CARDIOVASCULAR: Regular rate and rhythm, positive S1-S2, no murmurs rubs or gallops appreciated. ABDOMINAL: Soft, nontender, nondistended, positive bowel sounds in all 4 quadrants, no apparent ascites.   EXTREMITIES: No peripheral edema  HEMATOLOGIC: No significant bruising NEUROLOGIC: Oriented to person, place, and time. Nonfocal. Normal muscle tone.  PSYCHIATRIC: Normal mood and affect. Normal behavior. Cooperative  RADIOLOGY: MR angio head without contrast April 15th 2021: IMPRESSION: Acute left MCA territory infarcts involving the superior temporal lobe, posterior insula, and basal ganglia and adjacent white matter. Small petechial hemorrhage is present. Persistent nonocclusive thrombus within a proximal left M2 MCA branch.  CT head code stroke without contrast 07/29/2019: IMPRESSION: 1. Acute infarct left temporal lobe without hemorrhage.   CT Code Stroke CTA Head W/WO contrast 07/29/2019: IMPRESSION: 1. Short segment occlusion left M2 branch consistent with acute thrombus. 2. Mild to moderate atherosclerotic stenosis in the cavernous carotid bilaterally. 3. Mild atherosclerotic disease carotid bifurcation bilaterally without significant stenosis. 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 4. Both vertebral arteries widely patent without stenosis.  CARDIAC DATABASE: EKG: 05/31/2020: Sinus bradycardia, 48 bpm, old inferior infarct, diffuse T wave inversions without underlying injury pattern.  No significant change compared to prior ECG dated 06/02/2019.  Echocardiogram: 05/05/2018: LVEF 35-40%, with regional wall motion abnormalities, grade 1 diastolic impairment, normal left atrial pressure, mild to moderate TR, RVSP 23 mmHg.  07/29/2019 [facility]): LVEF 35-40%, moderately reduced left ventricular systolic function, regional wall motion abnormalities, grade 1 diastolic impairment, elevated left atrial pressure, PASP 25 mmHg, mildly dilated left atrium, mild MR.   07/30/2019: Limited study with [facility] at [facility]. LVEF 40-45%,  no mural apical thrombus with septal and apical akinesis.  Stress Testing:  None  Heart Catheterization: Coronary Angiogram in  [place] Acute anterior MI: Stenting with 3.5 x 12 mm proximal and 3.5 x 32 mm promos Premier DES in the mid LAD. 50-60% mid circumflex stenosis, nondominant right with a mid 85% stenosis.  Coronary Angiogram 02/21/2015 1. Widely patent mid LAD stent, 3.5 x 12 and 3.5 x 32 mm Promus placed on 03/12/2014 for acute MI, type III LAD which supplies essentially the entire anterolateral wall and inferior wall. 2. Moderate stenosis in the mid circumflex, 50%. Nondominant RCA. 3. Ischemic cardiomyopathy, LVEF 30-35% with mid to distal anterior anteroapical, apical, apical inferior, mid inferior severe hypokinesis to akinesis. 4. Limited abdominal aortogram with bifemoral runoff reveals widely patent iliac vessels, profunda femoral vessel, moderately diseased bilateral internal iliac, severely diseased bilateral profunda femoral artery.  Peripheral angiography:  Peripheral arteriogram 06/27/2015:Right SFA atherectomy with Turbo Hawk followed by 6x150 mm Lutonix (DCB) angioplasty. 90% to 0%.  Left leg angioplasty 05/30/15: 1.5 mm solid crown CSI atherectomy left SFA, popliteal artery lesions and anterior tibial vessel & drug coated balloon angioplasty to the mid left SFA 95% to 0%. Stenting of proximal and mid popliteal Artery 5.0 x 100 mm Supera self-expanding stent.  Carotid artery duplex 05/19/2020:  Stenosis in the right internal carotid artery (50-69%). Stenosis in the  right external carotid artery (<50%).  Stenosis in the left internal carotid artery (50-69%).  Antegrade right vertebral artery flow. Antegrade left vertebral artery  flow.  Follow up in six months is appropriate if clinically indicated. Compared to 12/21/2019, no significant change.   Vascular imaging: Lower extremity arterial duplex 04/04/2016: No hemodynamically significant stenoses are identified in the lower extremity arterial system. There is diffuse soft plaque in the bilateral SFA. This exam reveals mildly decreased  perfusion of the right lower extremity, with RABI 0.87 and moderately decreased perfusion of the left lower extremity, with LABI 0.65 noted at the dorsalis pedis artery level with biphasic waveform. The bilateral SFA angioplasty site & left popliteal artery stent is patent. Compared to the study done on 02/09/2015, ABI improved bilaterally from right 0.73 and left 0.53.  24 hour Holter monitor: Dominant rhythm normal sinus rhythm.  Heart rate 44-103 bpm.  Average heart rate 69 bpm. No atrial fibrillation or sinus pause greater than or equal to 2.5 seconds in duration. Ventricular ectopy 45 beats, with 0 ventricular pairs and 0 ventricular runs, total ventricular ectopic burden 0.2%. Supraventricular ectopy 3 beats, with 0 supraventricular runs, total supraventricular ectopic burden 0.01%. Heart rate < 60 bpm for 23.3% of the recording. Heart rate > 100 bpm for 2.3% of the recording. No patient diary submitted as part of the study.  Patient was recommended to have a 30day mobile cardiac ambulatory telemetry; however, he refused to wear it for 30days. Therefore, changed to 24 hour holter.   LABORATORY DATA: CBC Latest Ref Rng & Units 07/30/2019 07/29/2019 07/29/2019  WBC 4.0 - 10.5 K/uL 4.6 - 4.6  Hemoglobin 13.0 - 17.0 g/dL 13.9 14.3 14.6  Hematocrit 39.0 - 52.0 % 42.9 42.0 44.5  Platelets 150 - 400 K/uL 212 - 233    CMP Latest Ref Rng & Units 12/13/2019 09/16/2019 07/30/2019  Glucose 65 - 99 mg/dL 107(H) 114(H) 68(L)  BUN 8 - 27 mg/dL 12 13 8   Creatinine 0.76 - 1.27 mg/dL 1.27 1.17 1.29(H)  Sodium 134 - 144 mmol/L 140 141 141  Potassium 3.5 - 5.2 mmol/L 4.8  4.8 3.5  Chloride 96 - 106 mmol/L 106 105 109  CO2 20 - 29 mmol/L 20 23 20(L)  Calcium 8.6 - 10.2 mg/dL 9.8 9.7 8.9  Total Protein 6.5 - 8.1 g/dL - - -  Total Bilirubin 0.3 - 1.2 mg/dL - - -  Alkaline Phos 38 - 126 U/L - - -  AST 15 - 41 U/L - - -  ALT 0 - 44 U/L - - -    Lipid Panel     Component Value Date/Time   CHOL 114  07/30/2019 0551   CHOL 163 06/09/2019 1024   TRIG 111 07/30/2019 0551   HDL 41 07/30/2019 0551   HDL 65 06/09/2019 1024   CHOLHDL 2.8 07/30/2019 0551   VLDL 22 07/30/2019 0551   LDLCALC 51 07/30/2019 0551   LDLCALC 84 06/09/2019 1024   LABVLDL 14 06/09/2019 1024    Lab Results  Component Value Date   HGBA1C 6.2 (H) 07/30/2019   No components found for: NTPROBNP No results found for: TSH  Cardiac Panel (last 3 results) No results for input(s): CKTOTAL, CKMB, TROPONINIHS, RELINDX in the last 72 hours.  FINAL MEDICATION LIST END OF ENCOUNTER: Meds ordered this encounter  Medications  . rosuvastatin (CRESTOR) 40 MG tablet    Sig: Take 1 tablet (40 mg total) by mouth at bedtime.    Dispense:  90 tablet    Refill:  1  . spironolactone (ALDACTONE) 25 MG tablet    Sig: Take 1 tablet (25 mg total) by mouth in the morning.    Dispense:  90 tablet    Refill:  0  . hydrALAZINE (APRESOLINE) 10 MG tablet    Sig: Take 1 tablet (10 mg total) by mouth 3 (three) times daily.    Dispense:  90 tablet    Refill:  0  . sacubitril-valsartan (ENTRESTO) 49-51 MG    Sig: Take 1 tablet by mouth 2 (two) times daily.    Dispense:  180 tablet    Refill:  1  . clopidogrel (PLAVIX) 75 MG tablet    Sig: Take 1 tablet (75 mg total) by mouth daily.    Dispense:  90 tablet    Refill:  1  . carvedilol (COREG) 3.125 MG tablet    Sig: Take 1 tablet (3.125 mg total) by mouth 2 (two) times daily.    Dispense:  180 tablet    Refill:  0    Medications Discontinued During This Encounter  Medication Reason  . rosuvastatin (CRESTOR) 20 MG tablet Dose change  . spironolactone (ALDACTONE) 25 MG tablet Reorder  . clopidogrel (PLAVIX) 75 MG tablet Reorder  . carvedilol (COREG) 3.125 MG tablet Reorder  . hydrALAZINE (APRESOLINE) 10 MG tablet Reorder  . ENTRESTO 49-51 MG Reorder     Current Outpatient Medications:  .  CYANOCOBALAMIN PO, Take 1,000 mcg by mouth daily. sublingual, Disp: , Rfl:  .  ferrous  sulfate 325 (65 FE) MG tablet, Take 325 mg by mouth daily with breakfast., Disp: , Rfl:  .  Multiple Vitamin (MULTIVITAMIN) capsule, Take 1 capsule by mouth daily., Disp: , Rfl:  .  nitroGLYCERIN (NITROSTAT) 0.4 MG SL tablet, Place 0.4 mg under the tongue every 5 (five) minutes as needed for chest pain. , Disp: , Rfl:  .  rosuvastatin (CRESTOR) 40 MG tablet, Take 1 tablet (40 mg total) by mouth at bedtime., Disp: 90 tablet, Rfl: 1 .  carvedilol (COREG) 3.125 MG tablet, Take 1 tablet (3.125 mg total) by mouth 2 (  two) times daily., Disp: 180 tablet, Rfl: 0 .  clopidogrel (PLAVIX) 75 MG tablet, Take 1 tablet (75 mg total) by mouth daily., Disp: 90 tablet, Rfl: 1 .  hydrALAZINE (APRESOLINE) 10 MG tablet, Take 1 tablet (10 mg total) by mouth 3 (three) times daily., Disp: 90 tablet, Rfl: 0 .  sacubitril-valsartan (ENTRESTO) 49-51 MG, Take 1 tablet by mouth 2 (two) times daily., Disp: 180 tablet, Rfl: 1 .  spironolactone (ALDACTONE) 25 MG tablet, Take 1 tablet (25 mg total) by mouth in the morning., Disp: 90 tablet, Rfl: 0  IMPRESSION:    ICD-10-CM   1. Chronic heart failure with reduced ejection fraction and diastolic dysfunction (HCC)  I50.42 EKG 12-Lead    spironolactone (ALDACTONE) 25 MG tablet    hydrALAZINE (APRESOLINE) 10 MG tablet    sacubitril-valsartan (ENTRESTO) 49-51 MG    carvedilol (COREG) 3.125 MG tablet    Basic metabolic panel    Magnesium  2. Cryptogenic stroke (HCC)  I63.9 clopidogrel (PLAVIX) 75 MG tablet  3. Ischemic cardiomyopathy  I25.5   4. Stented coronary artery  Z95.5   5. History of ST elevation myocardial infarction (STEMI)  I25.2   6. Mixed hyperlipidemia  E78.2 rosuvastatin (CRESTOR) 40 MG tablet    clopidogrel (PLAVIX) 75 MG tablet    Lipid Panel With LDL/HDL Ratio    LDL cholesterol, direct  7. Bilateral carotid artery stenosis  I65.23 rosuvastatin (CRESTOR) 40 MG tablet    PCV CAROTID DUPLEX (BILATERAL)  8. Essential hypertension  I10   9. Current tobacco use   Z72.0   10. PAD (peripheral artery disease) (HCC)  I73.9      RECOMMENDATIONS: TOA MIA is a 80 y.o. male whose past medical history and cardiovascular risk factors include: history of ST segment elevation MI, status post angioplasty and stenting, established CAD, peripheral artery disease, hypertension, hyperlipidemia, active smoker, cryptogenic stroke, left MCA stroke, advanced age.  Chronic heart failure with reduced EF, NYHA class II:  Ejection Fraction noted on last 2D Echo  Recommend daily weight check, strict I/O's  Fluid restriction to <2L per day, Na restriction < 2g per day  Currently on Coreg, not uptitrating the medication secondary to hx of asymptomatic bradycardia.  Refilled Entresto, spironolactone, carvedilol, hydralazine.    Blood work to evaluate kidney function and electrolytes.    No hospitalizations for congestive heart failure since last office encounter.    Ischemic cardiomyopathy: See above  History of STEMI status post angioplasty and stent: Chronic and stable patient does not have anginal symptoms.  Continue current medical therapy  Carotid artery atherosclerosis:  We will uptitrate statin therapy to 40 mg Crestor p.o. nightly  Repeat carotid duplex in 6 months to reevaluate disease progression  Continue pharmacological therapy  Educated on importance of smoking cessation.  Mixed hyperlipidemia:  Check fasting lipid profile.  Uptitrate Crestor to 40 mg p.o. nightly.  Patient denies myalgia or other side effects.  Active smoking: Tobacco cessation counseling: Currently smoking 0.5 packs/day   Patient was informed of the dangers of tobacco abuse including stroke, cancer, and MI, as well as benefits of tobacco cessation. Patient is not willing to quit at this time. Approximately 5 mins were spent counseling patient cessation techniques. We discussed various methods to help quit smoking, including deciding on a date to quit, joining a  support group, pharmacological agents- nicotine gum/patch/lozenges, chantix.   I will reassess his progress at the next follow-up visit  Orders Placed This Encounter  Procedures  . Basic  metabolic panel  . Magnesium  . Lipid Panel With LDL/HDL Ratio  . LDL cholesterol, direct  . EKG 12-Lead  . PCV CAROTID DUPLEX (BILATERAL)   --Continue cardiac medications as reconciled in final medication list. --Return in about 28 weeks (around 12/13/2020) for Follow up, CAD, Carotid disease. . Or sooner if needed. --Continue follow-up with your primary care physician regarding the management of your other chronic comorbid conditions.  Patient's questions and concerns were addressed to his satisfaction. He voices understanding of the instructions provided during this encounter.   This note was created using a voice recognition software as a result there may be grammatical errors inadvertently enclosed that do not reflect the nature of this encounter. Every attempt is made to correct such errors.  Rex Kras, [place], [facility]  Pager: [phone] Office: [phone]

## 2020-06-29 ENCOUNTER — Other Ambulatory Visit: Payer: Self-pay | Admitting: Cardiology

## 2020-06-29 DIAGNOSIS — I5042 Chronic combined systolic (congestive) and diastolic (congestive) heart failure: Secondary | ICD-10-CM

## 2020-07-24 ENCOUNTER — Other Ambulatory Visit: Payer: Self-pay | Admitting: Cardiology

## 2020-07-24 DIAGNOSIS — I5042 Chronic combined systolic (congestive) and diastolic (congestive) heart failure: Secondary | ICD-10-CM

## 2020-08-15 ENCOUNTER — Other Ambulatory Visit: Payer: Self-pay | Admitting: Cardiology

## 2020-08-15 DIAGNOSIS — I5042 Chronic combined systolic (congestive) and diastolic (congestive) heart failure: Secondary | ICD-10-CM

## 2020-08-24 ENCOUNTER — Other Ambulatory Visit: Payer: Self-pay | Admitting: Cardiology

## 2020-08-24 DIAGNOSIS — I739 Peripheral vascular disease, unspecified: Secondary | ICD-10-CM | POA: Diagnosis not present

## 2020-08-24 DIAGNOSIS — E7800 Pure hypercholesterolemia, unspecified: Secondary | ICD-10-CM | POA: Diagnosis not present

## 2020-08-24 DIAGNOSIS — I10 Essential (primary) hypertension: Secondary | ICD-10-CM | POA: Diagnosis not present

## 2020-08-24 DIAGNOSIS — I2510 Atherosclerotic heart disease of native coronary artery without angina pectoris: Secondary | ICD-10-CM | POA: Diagnosis not present

## 2020-08-24 DIAGNOSIS — I5042 Chronic combined systolic (congestive) and diastolic (congestive) heart failure: Secondary | ICD-10-CM | POA: Diagnosis not present

## 2020-08-24 DIAGNOSIS — I110 Hypertensive heart disease with heart failure: Secondary | ICD-10-CM | POA: Diagnosis not present

## 2020-08-24 DIAGNOSIS — D649 Anemia, unspecified: Secondary | ICD-10-CM | POA: Diagnosis not present

## 2020-08-30 DIAGNOSIS — D171 Benign lipomatous neoplasm of skin and subcutaneous tissue of trunk: Secondary | ICD-10-CM | POA: Diagnosis not present

## 2020-11-06 ENCOUNTER — Other Ambulatory Visit: Payer: Self-pay | Admitting: Cardiology

## 2020-11-06 DIAGNOSIS — I5042 Chronic combined systolic (congestive) and diastolic (congestive) heart failure: Secondary | ICD-10-CM

## 2020-11-21 ENCOUNTER — Other Ambulatory Visit: Payer: Self-pay | Admitting: Cardiology

## 2020-11-21 DIAGNOSIS — I6523 Occlusion and stenosis of bilateral carotid arteries: Secondary | ICD-10-CM

## 2020-11-21 DIAGNOSIS — I5042 Chronic combined systolic (congestive) and diastolic (congestive) heart failure: Secondary | ICD-10-CM

## 2020-11-21 DIAGNOSIS — I639 Cerebral infarction, unspecified: Secondary | ICD-10-CM

## 2020-11-21 DIAGNOSIS — E782 Mixed hyperlipidemia: Secondary | ICD-10-CM

## 2020-11-28 ENCOUNTER — Ambulatory Visit: Payer: PPO

## 2020-11-28 ENCOUNTER — Other Ambulatory Visit: Payer: Self-pay

## 2020-11-28 DIAGNOSIS — I6523 Occlusion and stenosis of bilateral carotid arteries: Secondary | ICD-10-CM

## 2020-11-30 DIAGNOSIS — I5042 Chronic combined systolic (congestive) and diastolic (congestive) heart failure: Secondary | ICD-10-CM | POA: Diagnosis not present

## 2020-11-30 DIAGNOSIS — E782 Mixed hyperlipidemia: Secondary | ICD-10-CM | POA: Diagnosis not present

## 2020-12-01 LAB — LIPID PANEL WITH LDL/HDL RATIO
Cholesterol, Total: 154 mg/dL (ref 100–199)
HDL: 52 mg/dL (ref >39)
LDL Chol Calc (NIH): 85 mg/dL (ref 0–99)
LDL/HDL Ratio: 1.6 ratio (ref 0.0–3.6)
Triglycerides: 89 mg/dL (ref 0–149)
VLDL Cholesterol Cal: 17 mg/dL (ref 5–40)

## 2020-12-01 LAB — BASIC METABOLIC PANEL
BUN/Creatinine Ratio: 10 (ref 10–24)
BUN: 13 mg/dL (ref 8–27)
CO2: 21 mmol/L (ref 20–29)
Calcium: 9.9 mg/dL (ref 8.6–10.2)
Chloride: 105 mmol/L (ref 96–106)
Creatinine, Ser: 1.28 mg/dL — ABNORMAL HIGH (ref 0.76–1.27)
Glucose: 98 mg/dL (ref 65–99)
Potassium: 5.1 mmol/L (ref 3.5–5.2)
Sodium: 139 mmol/L (ref 134–144)
eGFR: 58 mL/min/{1.73_m2} — ABNORMAL LOW (ref >59)

## 2020-12-01 LAB — LDL CHOLESTEROL, DIRECT: LDL Direct: 84 mg/dL (ref 0–99)

## 2020-12-01 LAB — MAGNESIUM: Magnesium: 2.3 mg/dL (ref 1.6–2.3)

## 2020-12-04 ENCOUNTER — Other Ambulatory Visit: Payer: Self-pay | Admitting: Cardiology

## 2020-12-04 DIAGNOSIS — I5042 Chronic combined systolic (congestive) and diastolic (congestive) heart failure: Secondary | ICD-10-CM

## 2020-12-07 ENCOUNTER — Other Ambulatory Visit: Payer: Self-pay

## 2020-12-07 ENCOUNTER — Ambulatory Visit: Payer: PPO | Admitting: Cardiology

## 2020-12-07 ENCOUNTER — Encounter: Payer: Self-pay | Admitting: Cardiology

## 2020-12-07 VITALS — BP 166/72 | HR 56 | Temp 97.6°F | Resp 16 | Ht 70.0 in | Wt 164.8 lb

## 2020-12-07 DIAGNOSIS — I6523 Occlusion and stenosis of bilateral carotid arteries: Secondary | ICD-10-CM | POA: Diagnosis not present

## 2020-12-07 DIAGNOSIS — Z720 Tobacco use: Secondary | ICD-10-CM

## 2020-12-07 DIAGNOSIS — I739 Peripheral vascular disease, unspecified: Secondary | ICD-10-CM

## 2020-12-07 DIAGNOSIS — E782 Mixed hyperlipidemia: Secondary | ICD-10-CM | POA: Diagnosis not present

## 2020-12-07 DIAGNOSIS — Z955 Presence of coronary angioplasty implant and graft: Secondary | ICD-10-CM

## 2020-12-07 DIAGNOSIS — I5042 Chronic combined systolic (congestive) and diastolic (congestive) heart failure: Secondary | ICD-10-CM | POA: Diagnosis not present

## 2020-12-07 DIAGNOSIS — I255 Ischemic cardiomyopathy: Secondary | ICD-10-CM

## 2020-12-07 DIAGNOSIS — I252 Old myocardial infarction: Secondary | ICD-10-CM | POA: Diagnosis not present

## 2020-12-07 DIAGNOSIS — I10 Essential (primary) hypertension: Secondary | ICD-10-CM | POA: Diagnosis not present

## 2020-12-07 DIAGNOSIS — I639 Cerebral infarction, unspecified: Secondary | ICD-10-CM

## 2020-12-07 MED ORDER — ASPIRIN EC 81 MG PO TBEC: 81.0000 mg | DELAYED_RELEASE_TABLET | Freq: Every day | ORAL | 11 refills | Status: DC

## 2020-12-07 MED ORDER — EZETIMIBE 10 MG PO TABS: 10.0000 mg | ORAL_TABLET | Freq: Every day | ORAL | 0 refills | Status: DC

## 2020-12-07 NOTE — Progress Notes (Signed)
Shawn Schmidt Date of Birth: 04/02/1941 MRN: [id number] Primary Care Provider:[name], [name], MD Former Cardiology Providers: Shawn Lager, APRN, FNP-C Primary Cardiologist: Shawn Kras, DO (established care 06/30/2019)  Date: 12/07/20 Last Office Visit: 05/31/2020  Chief Complaint  Patient presents with   Coronary Artery Disease   Carotid disease   Follow-up    HPI  Shawn Schmidt is a 80 y.o.  male who presents to the office with a chief complaint of " carotid and heart failure management."  Patient's past medical history and cardiovascular risk factors include:  history of ST segment elevation MI, status post angioplasty and stenting, established CAD, ischemic cardiomyopathy, peripheral artery disease, hypertension, hyperlipidemia, active smoker, acute left MCA infarcts (April 2021), advanced age.   Patient presents today for 47-monthfollow-up given his underlying ischemic cardiomyopathy and chronic heart failure with reduced EF.  He remains overall euvolemic and no symptoms to suggest congestive heart failure.  No hospitalizations or urgent care visits for cardiovascular symptoms since last office encounter.  Patient denies any chest pain or anginal discomfort.  EKG reviewed and noted below for further reference.  Carotid duplex results were reviewed with both the patient and his daughter [name] present at today's office visit.  Results noted below for further reference.  He was uptitrated on Crestor at the last office visit and does not endorse myalgias.  Reviewed the most recent labs from 11/30/2020 with both the patient and his daughter.  Renal function remains relatively stable.  LDL is 84 mg/dL.  ALLERGIES: Allergies  Allergen Reactions   Lisinopril Other (See Comments)    Hyper activity   Other     Halothane///Anesthesia - test was done to show allergy - Son had very bad experience- life threatening      MEDICATION LIST PRIOR TO VISIT: Current Outpatient  Medications on File Prior to Visit  Medication Sig Dispense Refill   carvedilol (COREG) 3.125 MG tablet Take 1 tablet by mouth twice daily 180 tablet 0   ENTRESTO 49-51 MG Take 1 tablet by mouth twice daily 180 tablet 0   hydrALAZINE (APRESOLINE) 10 MG tablet TAKE 1 TABLET BY MOUTH THREE TIMES DAILY 90 tablet 0   nitroGLYCERIN (NITROSTAT) 0.4 MG SL tablet Place 0.4 mg under the tongue every 5 (five) minutes as needed for chest pain.      rosuvastatin (CRESTOR) 40 MG tablet TAKE 1 TABLET BY MOUTH AT BEDTIME 90 tablet 0   spironolactone (ALDACTONE) 25 MG tablet TAKE 1 TABLET BY MOUTH IN THE MORNING 90 tablet 0   No current facility-administered medications on file prior to visit.    PAST MEDICAL HISTORY: Past Medical History:  Diagnosis Date   Carotid artery occlusion    CHF (congestive heart failure) (HCC)    Coronary artery disease    HTN (hypertension) 11/27/2018   Hyperlipemia 11/27/2018   Hyperlipidemia    Hypertension    Ischemic cardiomyopathy    Peripheral artery disease (HCC)    [place]    PAST SURGICAL HISTORY: Past Surgical History:  Procedure Laterality Date   CARDIAC CATHETERIZATION N/A 02/21/2015   Procedure: Left Heart Cath and Coronary Angiography;  Surgeon: JAdrian Prows MD;  [facility];  Service: Cardiovascular;  Laterality: N/A;   CORONARY ANGIOPLASTY     LIPOMA EXCISION Right    PERIPHERAL VASCULAR CATHETERIZATION Bilateral 04/25/2015   Procedure: Lower Extremity Angiography;  Surgeon: JAdrian Prows MD;  [facility];  Service: Cardiovascular;  Laterality: Bilateral;   PERIPHERAL VASCULAR CATHETERIZATION Left 05/30/2015   Procedure: Peripheral Vascular Intervention;  Surgeon: [name], MD;  Location: [place] [facility];  Service: Cardiovascular;  Laterality: Left;  POPLITEAL   PERIPHERAL VASCULAR CATHETERIZATION Left 05/30/2015   Procedure: Peripheral Vascular Intervention;   Surgeon: [name], MD;  Location: [place] CV LAB;  Service: Cardiovascular;  Laterality: Left;  POPLITEAL    FAMILY HISTORY: The patient's family history includes Hypertension in his father and mother.   SOCIAL HISTORY:  The patient  reports that he has been smoking cigarettes. He has been smoking an average of 1 pack per day. He has never used smokeless tobacco. He reports current alcohol use. He reports that he does not use drugs.  Review of Systems  Constitutional: Negative for chills and fever.  HENT:  Negative for ear discharge, ear pain and nosebleeds.   Eyes:  Negative for blurred vision and discharge.  Cardiovascular:  Negative for chest pain, claudication, dyspnea on exertion, leg swelling, near-syncope, orthopnea, palpitations, paroxysmal nocturnal dyspnea and syncope.  Respiratory:  Positive for shortness of breath (chronic and stable). Negative for cough.   Endocrine: Negative for polydipsia, polyphagia and polyuria.  Hematologic/Lymphatic: Negative for bleeding problem.  Skin:  Negative for flushing and nail changes.  Musculoskeletal:  Negative for muscle cramps, muscle weakness and myalgias.  Gastrointestinal:  Negative for abdominal pain, dysphagia, nausea and vomiting.  Neurological:  Negative for dizziness, focal weakness and light-headedness.   PHYSICAL EXAM: Vitals with BMI 12/07/2020 05/31/2020 12/30/2019  Height '5\' 10"'$  '5\' 10"'$  '5\' 10"'$   Weight 164 lbs 13 oz 173 lbs 13 oz 168 lbs 13 oz  BMI 23.65 [id number]  Systolic [id number]  Diastolic 72 73 73  Pulse 56 55 48   CONSTITUTIONAL: Well-developed and well-nourished. No acute distress.  SKIN: Skin is warm and dry. No rash noted. No cyanosis. No pallor. No jaundice HEAD: Normocephalic and atraumatic.  EYES: No scleral icterus MOUTH/THROAT: Moist oral membranes.  NECK: No JVD present. No thyromegaly noted.  No bruits appreciated. LYMPHATIC: No visible cervical adenopathy.  CHEST Normal respiratory effort. No  intercostal retractions  LUNGS: Clear to auscultation bilaterally.  No stridor. No wheezes. No rales.  CARDIOVASCULAR: Regular rate and rhythm, positive S1-S2, no murmurs rubs or gallops appreciated. ABDOMINAL: Soft, nontender, nondistended, positive bowel sounds in all 4 quadrants, no apparent ascites.  EXTREMITIES: No peripheral edema, warm to touch bilaterally, faint DP and PT pulses. HEMATOLOGIC: No significant bruising NEUROLOGIC: Oriented to person, place, and time. Nonfocal. Normal muscle tone.  PSYCHIATRIC: Normal mood and affect. Normal behavior. Cooperative  RADIOLOGY: MR angio head without contrast April 15th 2021: IMPRESSION: Acute left MCA territory infarcts involving the superior temporal lobe, posterior insula, and basal ganglia and adjacent white matter. Small petechial hemorrhage is present. Persistent nonocclusive thrombus within a proximal left M2 MCA branch.  CT head code stroke without contrast 07/29/2019: IMPRESSION: 1. Acute infarct left temporal lobe without hemorrhage.   CT Code Stroke CTA Head W/WO contrast 07/29/2019: IMPRESSION: 1. Short segment occlusion left M2 branch consistent with acute thrombus. 2. Mild to moderate atherosclerotic stenosis in the cavernous carotid bilaterally. 3. Mild atherosclerotic disease carotid bifurcation bilaterally without significant stenosis. 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 4. Both vertebral arteries widely patent without stenosis.  CARDIAC DATABASE: EKG: 12/07/2020: No sinus bradycardia, 52 bpm, old inferior infarct, poor R wave progression, T wave inversions in the inferolateral leads suggestive of  possible ischemia.  Without underlying injury pattern.  Similar findings on prior EKG 05/31/2020.  Echocardiogram: 05/05/2018: LVEF 35-40%, with regional wall motion abnormalities, grade 1 diastolic impairment, normal left atrial pressure, mild to moderate TR, RVSP 23 mmHg.  07/29/2019 [facility]):  LVEF 35-40%, moderately reduced left ventricular systolic function, regional wall motion abnormalities, grade 1 diastolic impairment, elevated left atrial pressure, PASP 25 mmHg, mildly dilated left atrium, mild MR.   07/30/2019: Limited study with Def[facility] at [facility]. LVEF 40-45%, no mural apical thrombus with septal and apical akinesis.  Stress Testing:  None  Heart Catheterization: Coronary Angiogram in [place] Acute anterior MI: Stenting with 3.5 x 12 mm proximal and 3.5 x 32 mm promos Premier DES in the mid LAD. 50-60% mid circumflex stenosis, nondominant right with a mid 85% stenosis.   Coronary Angiogram 02/21/2015 1. Widely patent mid LAD stent, 3.5 x 12 and 3.5 x 32 mm Promus placed on 03/12/2014 for acute MI, type III LAD which supplies essentially the entire anterolateral wall and inferior wall. 2. Moderate stenosis in the mid circumflex, 50%. Nondominant RCA. 3. Ischemic cardiomyopathy, LVEF 30-35% with mid to distal anterior anteroapical, apical, apical inferior, mid inferior  severe hypokinesis to akinesis. 4. Limited abdominal aortogram with bifemoral runoff reveals widely patent iliac vessels, profunda femoral vessel, moderately diseased bilateral internal iliac, severely diseased bilateral profunda femoral artery.  Peripheral angiography:  Peripheral arteriogram 06/27/2015: Right SFA atherectomy with Turbo Hawk followed by 6x150 mm Lutonix (DCB) angioplasty. 90% to 0%.   Left leg angioplasty 05/30/15: 1.5 mm solid crown CSI atherectomy left SFA, popliteal artery lesions and anterior tibial vessel & drug coated balloon angioplasty to the mid left SFA 95% to 0%. Stenting of proximal and mid popliteal Artery 5.0 x 100 mm Supera self-expanding stent.  Carotid artery duplex 11/28/2020:  Duplex suggests stenosis in the right internal carotid artery (50-69%).  The right PSV internal/common carotid artery ratio of 7.39 is consistent with a stenosis of >70%.  Duplex suggests  stenosis in the left internal carotid artery (16-49%).  Antegrade right vertebral artery flow. Antegrade left vertebral artery flow.  Compared to 05/19/2020, no significant change, however right ICA/CCA ratio of 7.39 suggests >70% stenosis is new.  Suspect stenosis in the upper range of 50 to 69%. Follow up in six months is appropriate if clinically indicated.  Vascular imaging: Lower extremity arterial duplex 04/04/2016: No hemodynamically significant stenoses are identified in the lower extremity arterial system. There is diffuse soft plaque in the bilateral SFA. This exam reveals mildly decreased perfusion of the right lower extremity, with RABI 0.87 and moderately decreased perfusion of the left lower extremity, with LABI 0.65 noted at the dorsalis pedis artery level with biphasic waveform. The bilateral SFA angioplasty site & left popliteal artery stent is patent. Compared to the study done on 02/09/2015, ABI improved bilaterally from right 0.73 and left 0.53.  24 hour Holter monitor: Dominant rhythm normal sinus rhythm.  Heart rate 44-103 bpm.  Average heart rate 69 bpm. No atrial fibrillation or sinus pause greater than or equal to 2.5 seconds in duration. Ventricular ectopy 45 beats, with 0 ventricular pairs and 0 ventricular runs, total ventricular ectopic burden 0.2%. Supraventricular ectopy 3 beats, with 0 supraventricular runs, total supraventricular ectopic burden 0.01%. Heart rate < 60 bpm for 23.3% of the recording. Heart rate > 100 bpm for 2.3% of the recording. No patient diary submitted as part of the study.  Patient was recommended to have a 30day mobile cardiac  ambulatory telemetry; however, he refused to wear it for 30days. Therefore, changed to 24 hour holter.   LABORATORY DATA: CBC Latest Ref Rng & Units 07/30/2019 07/29/2019 07/29/2019  WBC 4.0 - 10.5 K/uL 4.6 - 4.6  Hemoglobin 13.0 - 17.0 g/dL 13.9 14.3 14.6  Hematocrit 39.0 - 52.0 % 42.9 42.0 44.5  Platelets 150 - 400  K/uL 212 - 233    CMP Latest Ref Rng & Units 11/30/2020 12/13/2019 09/16/2019  Glucose 65 - 99 mg/dL 98 107(H) 114(H)  BUN 8 - 27 mg/dL '13 12 13  '$ Creatinine 0.76 - 1.27 mg/dL 1.28(H) 1.27 1.17  Sodium 134 - 144 mmol/L 139 140 141  Potassium 3.5 - 5.2 mmol/L 5.1 4.8 4.8  Chloride 96 - 106 mmol/L 105 106 105  CO2 20 - 29 mmol/L '21 20 23  '$ Calcium 8.6 - 10.2 mg/dL 9.9 9.8 9.7  Total Protein 6.5 - 8.1 g/dL - - -  Total Bilirubin 0.3 - 1.2 mg/dL - - -  Alkaline Phos 38 - 126 U/L - - -  AST 15 - 41 U/L - - -  ALT 0 - 44 U/L - - -    Lipid Panel     Component Value Date/Time   CHOL 154 11/30/2020 0829   TRIG 89 11/30/2020 0829   HDL 52 11/30/2020 0829   CHOLHDL 2.8 07/30/2019 0551   VLDL 22 07/30/2019 0551   LDLCALC 85 11/30/2020 0829   LDLDIRECT 84 11/30/2020 0829   LABVLDL 17 11/30/2020 0829    Lab Results  Component Value Date   HGBA1C 6.2 (H) 07/30/2019   No components found for: NTPROBNP No results found for: TSH  Cardiac Panel (last 3 results) No results for input(s): CKTOTAL, CKMB, TROPONINIHS, RELINDX in the last 72 hours.  FINAL MEDICATION LIST END OF ENCOUNTER: Meds ordered this encounter  Medications   ezetimibe (ZETIA) 10 MG tablet    Sig: Take 1 tablet (10 mg total) by mouth daily.    Dispense:  90 tablet    Refill:  0   aspirin EC 81 MG tablet    Sig: Take 1 tablet (81 mg total) by mouth daily. Swallow whole.    Dispense:  30 tablet    Refill:  11    Medications Discontinued During This Encounter  Medication Reason   clopidogrel (PLAVIX) 75 MG tablet Error   CYANOCOBALAMIN PO Error   ferrous sulfate 325 (65 FE) MG tablet Error   Multiple Vitamin (MULTIVITAMIN) capsule Error     Current Outpatient Medications:    aspirin EC 81 MG tablet, Take 1 tablet (81 mg total) by mouth daily. Swallow whole., Disp: 30 tablet, Rfl: 11   carvedilol (COREG) 3.125 MG tablet, Take 1 tablet by mouth twice daily, Disp: 180 tablet, Rfl: 0   ENTRESTO 49-51 MG, Take 1  tablet by mouth twice daily, Disp: 180 tablet, Rfl: 0   ezetimibe (ZETIA) 10 MG tablet, Take 1 tablet (10 mg total) by mouth daily., Disp: 90 tablet, Rfl: 0   hydrALAZINE (APRESOLINE) 10 MG tablet, TAKE 1 TABLET BY MOUTH THREE TIMES DAILY, Disp: 90 tablet, Rfl: 0   nitroGLYCERIN (NITROSTAT) 0.4 MG SL tablet, Place 0.4 mg under the tongue every 5 (five) minutes as needed for chest pain. , Disp: , Rfl:    rosuvastatin (CRESTOR) 40 MG tablet, TAKE 1 TABLET BY MOUTH AT BEDTIME, Disp: 90 tablet, Rfl: 0   spironolactone (ALDACTONE) 25 MG tablet, TAKE 1 TABLET BY MOUTH IN THE MORNING, Disp: 90 tablet,  Rfl: 0  IMPRESSION:    ICD-10-CM   1. Bilateral carotid artery stenosis  I65.23 CT ANGIO NECK W OR WO CONTRAST    CT ANGIO HEAD W OR WO CONTRAST    ezetimibe (ZETIA) 10 MG tablet    aspirin EC 81 MG tablet    2. Chronic heart failure with reduced ejection fraction and diastolic dysfunction (HCC)  I50.42 EKG 12-Lead    3. Cryptogenic stroke (HCC)  I63.9     4. Ischemic cardiomyopathy  I25.5     5. Stented coronary artery  Z95.5     6. History of ST elevation myocardial infarction (STEMI)  I25.2     7. Mixed hyperlipidemia  E78.2 ezetimibe (ZETIA) 10 MG tablet    8. Essential hypertension  I10     9. Current tobacco use  Z72.0     10. PAD (peripheral artery disease) (HCC)  I73.9 PCV LOWER ARTERIAL (BILATERAL)    aspirin EC 81 MG tablet       RECOMMENDATIONS: TERIC BOGDA is a 80 y.o. male whose past medical history and cardiovascular risk factors include: history of ST segment elevation MI, status post angioplasty and stenting, established CAD, peripheral artery disease, hypertension, hyperlipidemia, active smoker, cryptogenic stroke, left MCA stroke, advanced age.  Bilateral carotid artery stenosis Follow-up carotid duplex concerning for greater than 70% stenosis involving the right ICA. Will order CTA head and neck with and without contrast for further evaluation Encouraged aspirin  81 mg p.o. daily Continue Crestor 40 mg p.o. nightly Add Zetia 10 mg p.o. daily Strongly we encouraged the importance of complete smoking cessation. Patient is aware to be more cognizant of symptoms that suggest visual changes or focal neurological deficits as they may be concerning for strokelike symptoms and should go to the closest ER via EMS.  Chronic heart failure with reduced ejection fraction and diastolic dysfunction (HCC) Overall euvolemic, not in congestive heart failure. Weight remains relatively stable. Last echocardiogram from February 2022 notes LVEF of 40-45% Recommended strict I's and O's, daily weights, fluid restrictions to less than 2 L/day, salt restriction to less than 1.5 g/day. Medications reconciled Most recent labs from 11/2020 independently reviewed with the patient and his daughter.  Ischemic cardiomyopathy Overall euvolemic. No symptoms to suggest ACS. History of STEMI and PCI in the past. See above Plan on repeating echocardiogram in February 2023.  Order still need to be placed.  Cryptogenic stroke (HCC) Continue aspirin and statin therapy. Recommend LDL goal of <70 mg/dL In the past has refused implantation of loop recorder Educated in the importance of secondary prevention.  Mixed hyperlipidemia Has tolerated the up titration of Crestor.  Repeat fasting lipid profile notes that the LDL is currently not at goal. Add Zetia 10 mg p.o. daily (office visit 12/07/2020)  PAD (peripheral artery disease) ([place]) Denies claudication. He has had prior interventions to bilateral lower extremities. Will check lower extremity arterial duplex for further disease progression.  Essential hypertension Office blood pressures are now well controlled.  However, patient states that his home blood pressures are well controlled with a systolic blood pressure around 130 mmHg. Medications reconciled .  Low-salt diet recommended.  Current tobacco use Tobacco cessation  counseling: Currently smoking 0.75 packs/day   Patient was informed of the dangers of tobacco abuse including stroke, cancer, and MI, as well as benefits of tobacco cessation. Patient is not willing to quit at this time. Approximately 5 mins were spent counseling patient cessation techniques. We discussed various methods to help  quit smoking, including deciding on a date to quit, joining a support group, pharmacological agents- nicotine gum/patch/lozenges, chantix.  I will reassess his progress at the next follow-up visit  Orders Placed This Encounter  Procedures   CT ANGIO NECK W OR WO CONTRAST   CT ANGIO HEAD W OR WO CONTRAST   EKG 12-Lead   PCV LOWER ARTERIAL (BILATERAL)   --Continue cardiac medications as reconciled in final medication list. --Return in about 4 weeks (around 01/04/2021) for Follow up carotid disease, status post a CTA head and neck.. Or sooner if needed. --Continue follow-up with your primary care physician regarding the management of your other chronic comorbid conditions.  Patient's questions and concerns were addressed to his satisfaction. He voices understanding of the instructions provided during this encounter.   This note was created using a voice recognition software as a result there may be grammatical errors inadvertently enclosed that do not reflect the nature of this encounter. Every attempt is made to correct such errors.  Shawn Schmidt, [place], [facility]  Pager: [phone] Office: [phone]

## 2020-12-15 NOTE — Progress Notes (Signed)
Pts daughter refused to being meds in and said what we have is correct. And she hung up

## 2021-01-05 ENCOUNTER — Other Ambulatory Visit: Payer: Self-pay

## 2021-01-05 ENCOUNTER — Ambulatory Visit: Payer: PPO

## 2021-01-05 DIAGNOSIS — I739 Peripheral vascular disease, unspecified: Secondary | ICD-10-CM

## 2021-01-08 ENCOUNTER — Other Ambulatory Visit: Payer: Self-pay | Admitting: Cardiology

## 2021-01-08 DIAGNOSIS — I5042 Chronic combined systolic (congestive) and diastolic (congestive) heart failure: Secondary | ICD-10-CM

## 2021-01-12 NOTE — Progress Notes (Signed)
Called and spoke to pt, pt voiced understanding.

## 2021-01-29 ENCOUNTER — Ambulatory Visit: Payer: PPO | Admitting: Cardiology

## 2021-01-31 ENCOUNTER — Other Ambulatory Visit: Payer: Self-pay | Admitting: Cardiology

## 2021-01-31 DIAGNOSIS — I5042 Chronic combined systolic (congestive) and diastolic (congestive) heart failure: Secondary | ICD-10-CM

## 2021-02-02 ENCOUNTER — Other Ambulatory Visit: Payer: Self-pay | Admitting: Cardiology

## 2021-02-02 DIAGNOSIS — I5042 Chronic combined systolic (congestive) and diastolic (congestive) heart failure: Secondary | ICD-10-CM

## 2021-02-08 ENCOUNTER — Other Ambulatory Visit: Payer: Self-pay

## 2021-02-08 ENCOUNTER — Ambulatory Visit: Payer: PPO | Admitting: Cardiology

## 2021-02-08 ENCOUNTER — Encounter: Payer: Self-pay | Admitting: Cardiology

## 2021-02-08 VITALS — BP 151/77 | HR 59 | Temp 97.7°F | Resp 16 | Ht 70.0 in | Wt 165.8 lb

## 2021-02-08 DIAGNOSIS — I10 Essential (primary) hypertension: Secondary | ICD-10-CM | POA: Diagnosis not present

## 2021-02-08 DIAGNOSIS — Z720 Tobacco use: Secondary | ICD-10-CM

## 2021-02-08 DIAGNOSIS — I255 Ischemic cardiomyopathy: Secondary | ICD-10-CM

## 2021-02-08 DIAGNOSIS — I6523 Occlusion and stenosis of bilateral carotid arteries: Secondary | ICD-10-CM

## 2021-02-08 DIAGNOSIS — I639 Cerebral infarction, unspecified: Secondary | ICD-10-CM

## 2021-02-08 DIAGNOSIS — I5042 Chronic combined systolic (congestive) and diastolic (congestive) heart failure: Secondary | ICD-10-CM

## 2021-02-08 DIAGNOSIS — E782 Mixed hyperlipidemia: Secondary | ICD-10-CM | POA: Diagnosis not present

## 2021-02-08 DIAGNOSIS — I252 Old myocardial infarction: Secondary | ICD-10-CM

## 2021-02-08 DIAGNOSIS — Z955 Presence of coronary angioplasty implant and graft: Secondary | ICD-10-CM

## 2021-02-08 DIAGNOSIS — I739 Peripheral vascular disease, unspecified: Secondary | ICD-10-CM

## 2021-02-08 NOTE — Progress Notes (Signed)
Shawn Schmidt Date of Birth: [birth year] MRN: [id number] Primary Care Provider:[name], Jama Flavors, MD Former Cardiology Providers: Jeri Lager, APRN, FNP-C Primary Cardiologist: Rex Kras, DO (established care 06/30/2019)  Date: 02/08/21 Last Office Visit: 12/07/2020  Chief Complaint  Patient presents with   Follow-up    Carotid artery disease - after CT.      HPI  Shawn Schmidt is a 80 y.o.  male who presents to the office with a chief complaint of " carotid disease management."  Patient's past medical history and cardiovascular risk factors include:  history of ST segment elevation MI, status post angioplasty and stenting, established CAD, ischemic cardiomyopathy, peripheral artery disease, hypertension, hyperlipidemia, active smoker, acute left MCA infarcts (April 2021), advanced age.   Patient is accompanied by his daughter Shawn Schmidt at today's visit.  He provides verbal consent with regards to discussing medical condition in her presence.  Patient had a recent carotid duplex which noted progressive disease involving the right ICA based on duplex criteria and at the last office visit was recommended to uptitrate cholesterol medications and to undergo CTA head and neck to further evaluate ICA stenosis.  He has started Zetia which was requested at the last office visit and is tolerating the medication well.  However, patient forgot to have the CT of the head and neck performed.  Clinically he denies any focal neurological deficits.  He does have vision changes bilaterally right worse than the left.  But he also has a prior disease process affecting his right vision.  He has not been evaluated by ophthalmology.  Despite history of MCA stroke, ischemic cardiomyopathy, asymptomatic PAD, CAD status post angioplasty and stenting.  He continues to smoke cigarettes on a daily basis.  He has been educated during multiple office visits in the past with regards to the importance of complete smoking  cessation.   He is overall euvolemic and not in congestive heart failure.  And no use of sublingual nitroglycerin tablets.  He denies any symptoms of claudication.  In recent lower extremity duplex results reviewed with him and his daughter at today's visit and noted below for further reference.  ALLERGIES: Allergies  Allergen Reactions   Lisinopril Other (See Comments)    Hyper activity   Other     Halothane///Anesthesia - test was done to show allergy - Son had very bad experience- life threatening      MEDICATION LIST PRIOR TO VISIT: Current Outpatient Medications on File Prior to Visit  Medication Sig Dispense Refill   aspirin EC 81 MG tablet Take 1 tablet (81 mg total) by mouth daily. Swallow whole. 30 tablet 11   carvedilol (COREG) 3.125 MG tablet Take 1 tablet by mouth twice daily 180 tablet 0   clopidogrel (PLAVIX) 75 MG tablet Take 75 mg by mouth daily.     ENTRESTO 49-51 MG Take 1 tablet by mouth twice daily 180 tablet 0   ezetimibe (ZETIA) 10 MG tablet Take 1 tablet (10 mg total) by mouth daily. 90 tablet 0   hydrALAZINE (APRESOLINE) 10 MG tablet TAKE 1 TABLET BY MOUTH THREE TIMES DAILY 90 tablet 0   nitroGLYCERIN (NITROSTAT) 0.4 MG SL tablet Place 0.4 mg under the tongue every 5 (five) minutes as needed for chest pain.      rosuvastatin (CRESTOR) 40 MG tablet TAKE 1 TABLET BY MOUTH AT BEDTIME 90 tablet 0   spironolactone (ALDACTONE) 25 MG tablet TAKE 1 TABLET BY MOUTH IN THE MORNING 90 tablet 0   No  current facility-administered medications on file prior to visit.    PAST MEDICAL HISTORY: Past Medical History:  Diagnosis Date   Carotid artery occlusion    CHF (congestive heart failure) (HCC)    Coronary artery disease    HTN (hypertension) 11/27/2018   Hyperlipemia 11/27/2018   Hyperlipidemia    Hypertension    Ischemic cardiomyopathy    Peripheral artery disease (HCC)    Stroke due to embolism of left middle cerebral artery ([place]) 07/2019    PAST SURGICAL  HISTORY: Past Surgical History:  Procedure Laterality Date   CARDIAC CATHETERIZATION N/A 02/21/2015   Procedure: Left Heart Cath and Coronary Angiography;  Surgeon: [name], MD;  Location: [facility];  Service: Cardiovascular;  Laterality: N/A;   CORONARY ANGIOPLASTY     LIPOMA EXCISION Right    PERIPHERAL VASCULAR CATHETERIZATION Bilateral 04/25/2015   Procedure: Lower Extremity Angiography;  Surgeon: [name], MD;  Location: [facility];  Service: Cardiovascular;  Laterality: Bilateral;   PERIPHERAL VASCULAR CATHETERIZATION Left 05/30/2015   Procedure: Peripheral Vascular Intervention;  Surgeon: [name], MD;  Location: [facility];  Service: Cardiovascular;  Laterality: Left;  POPLITEAL   PERIPHERAL VASCULAR CATHETERIZATION Left 05/30/2015   Procedure: Peripheral Vascular Intervention;  Surgeon: [name], MD;  Location: [facility];  Service: Cardiovascular;  Laterality: Left;  POPLITEAL    FAMILY HISTORY: The patient's family history includes Hypertension in his father and mother.   SOCIAL HISTORY:  The patient  reports that he has been smoking cigarettes. He has been smoking an average of 1 pack per day. He has never used smokeless tobacco. He reports current alcohol use. He reports that he does not use drugs.  Review of Systems  Constitutional: Negative for chills and fever.  HENT:  Negative for ear discharge, ear pain and nosebleeds.   Eyes:  Negative for blurred vision and discharge.  Cardiovascular:  Positive for dyspnea on exertion (chronic and stable). Negative for chest pain, claudication, leg swelling, near-syncope, orthopnea, palpitations, paroxysmal nocturnal dyspnea and syncope.  Respiratory:  Negative for cough and shortness of breath.   Endocrine: Negative for polydipsia, polyphagia and polyuria.  Hematologic/Lymphatic: Negative for bleeding problem.  Skin:  Negative for flushing and nail changes.  Musculoskeletal:  Negative for muscle  cramps, muscle weakness and myalgias.  Gastrointestinal:  Negative for abdominal pain, dysphagia, nausea and vomiting.  Neurological:  Negative for dizziness, focal weakness and light-headedness.   PHYSICAL EXAM: Vitals with BMI 02/08/2021 12/07/2020 05/31/2020  Height 5\' 10"  5\' 10"  5\' 10"   Weight 165 lbs 13 oz 164 lbs 13 oz 173 lbs 13 oz  BMI 23.79 83.66 29.47  Systolic 654 650 354  Diastolic 77 72 73  Pulse 59 56 55   CONSTITUTIONAL: Well-developed and well-nourished. No acute distress.  SKIN: Skin is warm and dry. No rash noted. No cyanosis. No pallor. No jaundice HEAD: Normocephalic and atraumatic.  EYES: No scleral icterus MOUTH/THROAT: Moist oral membranes.  NECK: No JVD present. No thyromegaly noted.  Faint bruits present bilaterally. LYMPHATIC: No visible cervical adenopathy.  CHEST Normal respiratory effort. No intercostal retractions  LUNGS: Clear to auscultation bilaterally.  No stridor. No wheezes. No rales.  CARDIOVASCULAR: Regular rate and rhythm, positive S1-S2, no murmurs rubs or gallops appreciated. ABDOMINAL: Soft, nontender, nondistended, positive bowel sounds in all 4 quadrants, no apparent ascites.  EXTREMITIES: No peripheral edema, warm to touch bilaterally, faint DP and PT pulses. HEMATOLOGIC: No significant bruising NEUROLOGIC: Oriented to person, place,  and time. Nonfocal. Normal muscle tone.  PSYCHIATRIC: Normal mood and affect. Normal behavior. Cooperative  RADIOLOGY: MR angio head without contrast April 15th 2021: IMPRESSION: Acute left MCA territory infarcts involving the superior temporal lobe, posterior insula, and basal ganglia and adjacent white matter. Small petechial hemorrhage is present. Persistent nonocclusive thrombus within a proximal left M2 MCA branch.  CT head code stroke without contrast 07/29/2019: IMPRESSION: 1. Acute infarct left temporal lobe without hemorrhage.   CT Code Stroke CTA Head W/WO contrast 07/29/2019: IMPRESSION: 1. Short  segment occlusion left M2 branch consistent with acute thrombus. 2. Mild to moderate atherosclerotic stenosis in the cavernous carotid bilaterally. 3. Mild atherosclerotic disease carotid bifurcation bilaterally without significant stenosis. 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 4. Both vertebral arteries widely patent without stenosis.  CARDIAC DATABASE: EKG: 12/07/2020: No sinus bradycardia, 52 bpm, old inferior infarct, poor R wave progression, T wave inversions in the inferolateral leads suggestive of possible ischemia.  Without underlying injury pattern.  Similar findings on prior EKG 05/31/2020.  Echocardiogram: 05/05/2018: LVEF 35-40%, with regional wall motion abnormalities, grade 1 diastolic impairment, normal left atrial pressure, mild to moderate TR, RVSP 23 mmHg.  07/29/2019 [facility]): LVEF 35-40%, moderately reduced left ventricular systolic function, regional wall motion abnormalities, grade 1 diastolic impairment, elevated left atrial pressure, PASP 25 mmHg, mildly dilated left atrium, mild MR.   07/30/2019: Limited study with Def[facility] at [facility]. LVEF 40-45%, no mural apical thrombus with septal and apical akinesis.  Stress Testing:  None  Heart Catheterization: Coronary Angiogram in [place] Acute anterior MI: Stenting with 3.5 x 12 mm proximal and 3.5 x 32 mm promos Premier DES in the mid LAD. 50-60% mid circumflex stenosis, nondominant right with a mid 85% stenosis.   Coronary Angiogram 02/21/2015 1. Widely patent mid LAD stent, 3.5 x 12 and 3.5 x 32 mm Promus placed on 03/12/2014 for acute MI, type III LAD which supplies essentially the entire anterolateral wall and inferior wall. 2. Moderate stenosis in the mid circumflex, 50%. Nondominant RCA. 3. Ischemic cardiomyopathy, LVEF 30-35% with mid to distal anterior anteroapical, apical, apical inferior, mid inferior  severe hypokinesis to akinesis. 4. Limited abdominal aortogram with  bifemoral runoff reveals widely patent iliac vessels, profunda femoral vessel, moderately diseased bilateral internal iliac, severely diseased bilateral profunda femoral artery.  Peripheral angiography:  Peripheral arteriogram 06/27/2015: Right SFA atherectomy with Turbo Hawk followed by 6x150 mm Lutonix (DCB) angioplasty. 90% to 0%.   Left leg angioplasty 05/30/15: 1.5 mm solid crown CSI atherectomy left SFA, popliteal artery lesions and anterior tibial vessel & drug coated balloon angioplasty to the mid left SFA 95% to 0%. Stenting of proximal and mid popliteal Artery 5.0 x 100 mm Supera self-expanding stent.  Carotid artery duplex 11/28/2020:  Duplex suggests stenosis in the right internal carotid artery (50-69%).  The right PSV internal/common carotid artery ratio of 7.39 is consistent with a stenosis of >70%.  Duplex suggests stenosis in the left internal carotid artery (16-49%).  Antegrade right vertebral artery flow. Antegrade left vertebral artery flow.  Compared to 05/19/2020, no significant change, however right ICA/CCA ratio of 7.39 suggests >70% stenosis is new.  Suspect stenosis in the upper range of 50 to 69%. Follow up in six months is appropriate if clinically indicated.  Vascular imaging: Lower Extremity Arterial Duplex 01/05/2021:  No hemodynamically significant stenoses are identified in the right or left lower extremity arterial system.  This exam reveals mildly decreased perfusion of the right lower extremity,  noted at the dorsalis pedis artery level (ABI 0.80) and moderately decreased perfusion of the left lower extremity, noted at the dorsalis pedis artery level (ABI 0.74).  Right PT is diffusely diseased. The left PT appears to be severely diseased or occluded with collateral flow. Study suggest diffuse small vessel disease below knee.  Compared to the study done on 08/04/2015, no significant change.  Study also suggest patent bilateral angioplasty site at the SFA and left  popliteal artery to be patent.  24 hour Holter monitor: Dominant rhythm normal sinus rhythm.  Heart rate 44-103 bpm.  Average heart rate 69 bpm. No atrial fibrillation or sinus pause greater than or equal to 2.5 seconds in duration. Ventricular ectopy 45 beats, with 0 ventricular pairs and 0 ventricular runs, total ventricular ectopic burden 0.2%. Supraventricular ectopy 3 beats, with 0 supraventricular runs, total supraventricular ectopic burden 0.01%. Heart rate < 60 bpm for 23.3% of the recording. Heart rate > 100 bpm for 2.3% of the recording. No patient diary submitted as part of the study.  Patient was recommended to have a 30day mobile cardiac ambulatory telemetry; however, he refused to wear it for 30days. Therefore, changed to 24 hour holter.   LABORATORY DATA: CBC Latest Ref Rng & Units 07/30/2019 07/29/2019 07/29/2019  WBC 4.0 - 10.5 K/uL 4.6 - 4.6  Hemoglobin 13.0 - 17.0 g/dL 13.9 14.3 14.6  Hematocrit 39.0 - 52.0 % 42.9 42.0 44.5  Platelets 150 - 400 K/uL 212 - 233    CMP Latest Ref Rng & Units 11/30/2020 12/13/2019 09/16/2019  Glucose 65 - 99 mg/dL 98 107(H) 114(H)  BUN 8 - 27 mg/dL 13 12 13   Creatinine 0.76 - 1.27 mg/dL 1.28(H) 1.27 1.17  Sodium 134 - 144 mmol/L 139 140 141  Potassium 3.5 - 5.2 mmol/L 5.1 4.8 4.8  Chloride 96 - 106 mmol/L 105 106 105  CO2 20 - 29 mmol/L 21 20 23   Calcium 8.6 - 10.2 mg/dL 9.9 9.8 9.7  Total Protein 6.5 - 8.1 g/dL - - -  Total Bilirubin 0.3 - 1.2 mg/dL - - -  Alkaline Phos 38 - 126 U/L - - -  AST 15 - 41 U/L - - -  ALT 0 - 44 U/L - - -    Lipid Panel  Lab Results  Component Value Date   CHOL 154 11/30/2020   HDL 52 11/30/2020   LDLCALC 85 11/30/2020   LDLDIRECT 84 11/30/2020   TRIG 89 11/30/2020   CHOLHDL 2.8 07/30/2019    Lab Results  Component Value Date   HGBA1C 6.2 (H) 07/30/2019   No components found for: NTPROBNP No results found for: TSH  Cardiac Panel (last 3 results) No results for input(s): CKTOTAL, CKMB,  TROPONINIHS, RELINDX in the last 72 hours.  FINAL MEDICATION LIST END OF ENCOUNTER: No orders of the defined types were placed in this encounter.   There are no discontinued medications.    Current Outpatient Medications:    aspirin EC 81 MG tablet, Take 1 tablet (81 mg total) by mouth daily. Swallow whole., Disp: 30 tablet, Rfl: 11   carvedilol (COREG) 3.125 MG tablet, Take 1 tablet by mouth twice daily, Disp: 180 tablet, Rfl: 0   clopidogrel (PLAVIX) 75 MG tablet, Take 75 mg by mouth daily., Disp: , Rfl:    ENTRESTO 49-51 MG, Take 1 tablet by mouth twice daily, Disp: 180 tablet, Rfl: 0   ezetimibe (ZETIA) 10 MG tablet, Take 1 tablet (10 mg total) by mouth daily., Disp:  90 tablet, Rfl: 0   hydrALAZINE (APRESOLINE) 10 MG tablet, TAKE 1 TABLET BY MOUTH THREE TIMES DAILY, Disp: 90 tablet, Rfl: 0   nitroGLYCERIN (NITROSTAT) 0.4 MG SL tablet, Place 0.4 mg under the tongue every 5 (five) minutes as needed for chest pain. , Disp: , Rfl:    rosuvastatin (CRESTOR) 40 MG tablet, TAKE 1 TABLET BY MOUTH AT BEDTIME, Disp: 90 tablet, Rfl: 0   spironolactone (ALDACTONE) 25 MG tablet, TAKE 1 TABLET BY MOUTH IN THE MORNING, Disp: 90 tablet, Rfl: 0  IMPRESSION:    ICD-10-CM   1. Bilateral carotid artery stenosis  I65.23     2. Chronic heart failure with reduced ejection fraction and diastolic dysfunction (HCC)  I50.42 PCV ECHOCARDIOGRAM COMPLETE    3. Cryptogenic stroke (HCC)  I63.9     4. Ischemic cardiomyopathy  I25.5     5. Stented coronary artery  Z95.5     6. History of ST elevation myocardial infarction (STEMI)  I25.2     7. Mixed hyperlipidemia  E78.2     8. Essential hypertension  I10     9. Current tobacco use  Z72.0     10. PAD (peripheral artery disease) (HCC)  I73.9         RECOMMENDATIONS: Shawn Schmidt is a 80 y.o. male whose past medical history and cardiovascular risk factors include: history of ST segment elevation MI, status post angioplasty and stenting, established  CAD, peripheral artery disease, hypertension, hyperlipidemia, active smoker, cryptogenic stroke, left MCA stroke, advanced age.  Bilateral carotid artery stenosis Follow-up carotid duplex concerning for greater than 70% stenosis involving the right ICA. CTA head and neck with and without contrast ordered at the last office visit, still not completed.  Educated both the patient and his daughter with regards to the importance of this at today's visit. Continue dual antiplatelet therapy. Continue high intensity statin. Patient tolerated Zetia well without any side effects or intolerances. Reemphasized the importance of complete smoking cessation. Patient is aware to be more cognizant of symptoms that suggest visual changes or focal neurological deficits as they may be concerning for strokelike symptoms and should go to the closest ER via EMS.  Chronic heart failure with reduced ejection fraction and diastolic dysfunction (HCC) Overall euvolemic, not in congestive heart failure. Weight remains relatively stable. Last echocardiogram from February 2022 notes LVEF of 40-45% Recommended strict I's and O's, daily weights, fluid restrictions to less than 2 L/day, salt restriction to less than 1.5 g/day. Medications reconciled  Established coronary artery disease, prior PCI to the LAD, without angina pectoris, and history of ischemic cardiomyopathy: Denies angina pectoris No use of sublingual nitroglycerin tablets. Medications reconciled Currently on dual antiplatelet therapy Currently on high intensity statin therapy and Zetia Will repeat echocardiogram in February 2023, orders placed Continue to monitor  Cryptogenic stroke ([place]) Continue aspirin and statin therapy. Recommend LDL goal of <70 mg/dL In the past has refused implantation of loop recorder Educated in the importance of secondary prevention.  Mixed hyperlipidemia Has tolerated the up titration of Crestor.   Added Zetia 10 mg p.o.  daily (office visit 12/07/2020)  PAD (peripheral artery disease) ([place]) Denies claudication. Lower extremity arterial duplex results reviewed with the patient and his daughter at today's visit. He has had prior interventions to bilateral lower extremities. Reemphasized importance of complete smoking cessation.  Essential hypertension Office blood pressures are now well controlled.   However, home blood pressures are better controlled.   Reemphasized the importance of low-salt  diet. Continue to monitor   Current tobacco use Tobacco cessation counseling: Currently smoking 0.75 packs/day Patient is not willing to quit at this time. Spent 5 mins were spent counseling patient cessation techniques. We discussed various methods to help quit smoking, including deciding on a date to quit, joining a support group, pharmacological agents- nicotine gum/patch/lozenges.  I will reassess his progress at the next follow-up visit  Orders Placed This Encounter  Procedures   PCV ECHOCARDIOGRAM COMPLETE    --Continue cardiac medications as reconciled in final medication list. --Return in about 4 weeks (around 03/08/2021) for Follow up carotid disease. . Or sooner if needed. --Continue follow-up with your primary care physician regarding the management of your other chronic comorbid conditions.  Patient's questions and concerns were addressed to his satisfaction. He voices understanding of the instructions provided during this encounter.   This note was created using a voice recognition software as a result there may be grammatical errors inadvertently enclosed that do not reflect the nature of this encounter. Every attempt is made to correct such errors.  Rex Kras, [place], [facility]  Pager: [phone] Office: [phone]

## 2021-02-12 ENCOUNTER — Other Ambulatory Visit: Payer: Self-pay

## 2021-02-12 DIAGNOSIS — I5042 Chronic combined systolic (congestive) and diastolic (congestive) heart failure: Secondary | ICD-10-CM

## 2021-02-12 DIAGNOSIS — I10 Essential (primary) hypertension: Secondary | ICD-10-CM

## 2021-02-15 DIAGNOSIS — I5042 Chronic combined systolic (congestive) and diastolic (congestive) heart failure: Secondary | ICD-10-CM | POA: Diagnosis not present

## 2021-02-15 DIAGNOSIS — I10 Essential (primary) hypertension: Secondary | ICD-10-CM | POA: Diagnosis not present

## 2021-02-16 LAB — BASIC METABOLIC PANEL
BUN/Creatinine Ratio: 13 (ref 10–24)
BUN: 15 mg/dL (ref 8–27)
CO2: 22 mmol/L (ref 20–29)
Calcium: 9.1 mg/dL (ref 8.6–10.2)
Chloride: 108 mmol/L — ABNORMAL HIGH (ref 96–106)
Creatinine, Ser: 1.15 mg/dL (ref 0.76–1.27)
Glucose: 101 mg/dL — ABNORMAL HIGH (ref 70–99)
Potassium: 4.7 mmol/L (ref 3.5–5.2)
Sodium: 143 mmol/L (ref 134–144)
eGFR: 65 mL/min/{1.73_m2} (ref >59)

## 2021-02-16 NOTE — Progress Notes (Signed)
I spoke to patient he is aware of results and to f/u after scan with his daughter

## 2021-02-20 ENCOUNTER — Other Ambulatory Visit: Payer: Self-pay

## 2021-02-20 ENCOUNTER — Other Ambulatory Visit: Payer: Self-pay | Admitting: Cardiology

## 2021-02-20 ENCOUNTER — Ambulatory Visit (HOSPITAL_COMMUNITY): Payer: PPO

## 2021-02-20 ENCOUNTER — Ambulatory Visit (HOSPITAL_COMMUNITY)
Admission: RE | Admit: 2021-02-20 | Discharge: 2021-02-20 | Disposition: A | Discharge status: Home / Self Care | Payer: PPO | Source: Ambulatory Visit | Attending: Cardiology | Admitting: Cardiology

## 2021-02-20 DIAGNOSIS — I6503 Occlusion and stenosis of bilateral vertebral arteries: Secondary | ICD-10-CM | POA: Diagnosis not present

## 2021-02-20 DIAGNOSIS — I672 Cerebral atherosclerosis: Secondary | ICD-10-CM | POA: Diagnosis not present

## 2021-02-20 DIAGNOSIS — I6523 Occlusion and stenosis of bilateral carotid arteries: Secondary | ICD-10-CM

## 2021-02-20 DIAGNOSIS — M47812 Spondylosis without myelopathy or radiculopathy, cervical region: Secondary | ICD-10-CM | POA: Diagnosis not present

## 2021-02-20 DIAGNOSIS — I63233 Cerebral infarction due to unspecified occlusion or stenosis of bilateral carotid arteries: Secondary | ICD-10-CM | POA: Diagnosis not present

## 2021-02-20 MED ORDER — IOHEXOL 350 MG/ML SOLN
50.0000 mL | Freq: Once | INTRAVENOUS | Status: AC | PRN
  Administered 2021-02-20: 50 mL via INTRAVENOUS

## 2021-02-26 ENCOUNTER — Other Ambulatory Visit: Payer: Self-pay | Admitting: Cardiology

## 2021-02-26 DIAGNOSIS — I6523 Occlusion and stenosis of bilateral carotid arteries: Secondary | ICD-10-CM

## 2021-02-26 DIAGNOSIS — I5042 Chronic combined systolic (congestive) and diastolic (congestive) heart failure: Secondary | ICD-10-CM

## 2021-02-26 DIAGNOSIS — E782 Mixed hyperlipidemia: Secondary | ICD-10-CM

## 2021-02-28 NOTE — Progress Notes (Signed)
Spoke to patient he voiced understanding he has an appt with PCP this upcoming Tuesday I will fax results to them

## 2021-03-06 DIAGNOSIS — I5042 Chronic combined systolic (congestive) and diastolic (congestive) heart failure: Secondary | ICD-10-CM | POA: Diagnosis not present

## 2021-03-06 DIAGNOSIS — I2510 Atherosclerotic heart disease of native coronary artery without angina pectoris: Secondary | ICD-10-CM | POA: Diagnosis not present

## 2021-03-06 DIAGNOSIS — Z0000 Encounter for general adult medical examination without abnormal findings: Secondary | ICD-10-CM | POA: Diagnosis not present

## 2021-03-06 DIAGNOSIS — Z23 Encounter for immunization: Secondary | ICD-10-CM | POA: Diagnosis not present

## 2021-03-06 DIAGNOSIS — F17200 Nicotine dependence, unspecified, uncomplicated: Secondary | ICD-10-CM | POA: Diagnosis not present

## 2021-03-06 DIAGNOSIS — E7800 Pure hypercholesterolemia, unspecified: Secondary | ICD-10-CM | POA: Diagnosis not present

## 2021-03-06 DIAGNOSIS — I110 Hypertensive heart disease with heart failure: Secondary | ICD-10-CM | POA: Diagnosis not present

## 2021-03-13 ENCOUNTER — Other Ambulatory Visit: Payer: Self-pay | Admitting: Cardiology

## 2021-03-13 DIAGNOSIS — I6523 Occlusion and stenosis of bilateral carotid arteries: Secondary | ICD-10-CM

## 2021-03-13 DIAGNOSIS — I5042 Chronic combined systolic (congestive) and diastolic (congestive) heart failure: Secondary | ICD-10-CM

## 2021-03-13 DIAGNOSIS — E782 Mixed hyperlipidemia: Secondary | ICD-10-CM

## 2021-03-19 ENCOUNTER — Ambulatory Visit: Payer: PPO | Admitting: Cardiology

## 2021-03-26 ENCOUNTER — Ambulatory Visit: Payer: PPO | Admitting: Cardiology

## 2021-03-26 ENCOUNTER — Encounter: Payer: Self-pay | Admitting: Cardiology

## 2021-03-26 ENCOUNTER — Other Ambulatory Visit: Payer: Self-pay

## 2021-03-26 VITALS — BP 145/72 | HR 62 | Temp 97.5°F | Resp 16 | Ht 70.0 in | Wt 166.0 lb

## 2021-03-26 DIAGNOSIS — I2510 Atherosclerotic heart disease of native coronary artery without angina pectoris: Secondary | ICD-10-CM

## 2021-03-26 DIAGNOSIS — I773 Arterial fibromuscular dysplasia: Secondary | ICD-10-CM | POA: Diagnosis not present

## 2021-03-26 DIAGNOSIS — I5042 Chronic combined systolic (congestive) and diastolic (congestive) heart failure: Secondary | ICD-10-CM | POA: Diagnosis not present

## 2021-03-26 DIAGNOSIS — I10 Essential (primary) hypertension: Secondary | ICD-10-CM

## 2021-03-26 DIAGNOSIS — E7800 Pure hypercholesterolemia, unspecified: Secondary | ICD-10-CM | POA: Diagnosis not present

## 2021-03-26 MED ORDER — CARVEDILOL 6.25 MG PO TABS: 6.2500 mg | ORAL_TABLET | Freq: Two times a day (BID) | ORAL | 3 refills | Status: DC

## 2021-03-26 MED ORDER — CARVEDILOL 6.25 MG PO TABS
3.1250 mg | ORAL_TABLET | Freq: Two times a day (BID) | ORAL | 3 refills | Status: DC
Start: 2021-03-26 — End: 2021-03-26

## 2021-03-26 NOTE — Progress Notes (Signed)
Primary Physician/Referring:  [name], MD  Patient ID: Shawn Schmidt, male    DOB: 1941-01-18, 80 y.o.   MRN: [id number]  Chief Complaint  Patient presents with   Congestive Heart Failure   Follow-up    4 week   HPI:    Shawn Schmidt  is a 80 y.o. [place] male patient with STEMI 03/12/2014 in [place] with 2 overlapping proximal and mid LAD stents, ejection fraction was around 35% at that time, improved on medical therapy, hypertension, hyperlipidemia, PAD bilateral SFA angioplasty in 2017, stage IIIa chronic kidney disease, asymptomatic bilateral carotid artery stenosis, history of thrombotic stroke in June 2021, tobacco use disorder presents for follow-up of carotid stenosis and vascular disease evaluation.  In view of high-grade stenosis noted on the ultrasound, underwent CTA of the head and neck.   Tolerating medications well. No complaints today. Denies any symptoms of claudication. He remains active with doing yard work. He does continue to smoke 1 pack per day.   Past Medical History:  Diagnosis Date   Carotid artery occlusion    CHF (congestive heart failure) (HCC)    Coronary artery disease    HTN (hypertension) 11/27/2018   Hyperlipemia 11/27/2018   Hyperlipidemia    Hypertension    Ischemic cardiomyopathy    Peripheral artery disease ([place])    Stroke due to embolism of left middle cerebral artery ([place]) 07/2019   Past Surgical History:  Procedure Laterality Date   CARDIAC CATHETERIZATION N/A 02/21/2015   Procedure: Left Heart Cath and Coronary Angiography;  Surgeon: [name], MD;  Location: [facility];  Service: Cardiovascular;  Laterality: N/A;   CORONARY ANGIOPLASTY     LIPOMA EXCISION Right    PERIPHERAL VASCULAR CATHETERIZATION Bilateral 04/25/2015   Procedure: Lower Extremity Angiography;  Surgeon: [name], MD;  Location: [facility];  Service: Cardiovascular;  Laterality: Bilateral;   PERIPHERAL VASCULAR CATHETERIZATION  Left 05/30/2015   Procedure: Peripheral Vascular Intervention;  Surgeon: [name], MD;  Location: [facility];  Service: Cardiovascular;  Laterality: Left;  POPLITEAL   PERIPHERAL VASCULAR CATHETERIZATION Left 05/30/2015   Procedure: Peripheral Vascular Intervention;  Surgeon: [name], MD;  Location: [facility];  Service: Cardiovascular;  Laterality: Left;  POPLITEAL   Family History  Problem Relation Age of Onset   Hypertension Mother    Hypertension Father     Social History   Tobacco Use   Smoking status: Some Days    Packs/day: 1.00    Types: Cigarettes   Smokeless tobacco: Never  Substance Use Topics   Alcohol use: Yes    Comment: occ   Marital Status: Widowed  ROS  Review of Systems  Cardiovascular:  Negative for chest pain, dyspnea on exertion and leg swelling.  Gastrointestinal:  Negative for melena.  Objective  Blood pressure (!) 145/72, pulse 62, temperature (!) 97.5 F (36.4 C), resp. rate 16, height 5\' 10"  (1.778 m), weight 166 lb (75.3 kg), SpO2 98 %. Body mass index is 23.82 kg/m.  Vitals with BMI 03/26/2021 02/08/2021 12/07/2020  Height 5\' 10"  5\' 10"  5\' 10"   Weight 166 lbs 165 lbs 13 oz 164 lbs 13 oz  BMI 23.82 09.38 18.29  Systolic 937 169 678  Diastolic 72 77 72  Pulse 62 59 56    Physical Exam Neck:     Vascular: No carotid bruit or JVD.  Cardiovascular:     Rate and Rhythm: Normal rate and regular rhythm.  Pulses: Intact distal pulses.          Femoral pulses are 2+ on the right side and 2+ on the left side.      Popliteal pulses are 2+ on the right side and 2+ on the left side.       Dorsalis pedis pulses are 2+ on the right side and 2+ on the left side.       Posterior tibial pulses are 1+ on the right side and 0 on the left side.     Heart sounds: Normal heart sounds. No murmur heard.   No gallop.  Pulmonary:     Effort: Pulmonary effort is normal.     Breath sounds: Normal breath sounds.  Abdominal:     General: Bowel  sounds are normal.     Palpations: Abdomen is soft.  Musculoskeletal:        General: No swelling.     Laboratory examination:   Recent Labs    11/30/20 0829 02/15/21 0826  NA 139 143  K 5.1 4.7  CL 105 108*  CO2 21 22  GLUCOSE 98 101*  BUN 13 15  CREATININE 1.28* 1.15  CALCIUM 9.9 9.1   CrCl cannot be calculated (Patient's most recent lab result is older than the maximum 21 days allowed.).  CMP Latest Ref Rng & Units 02/15/2021 11/30/2020 12/13/2019  Glucose 70 - 99 mg/dL 101(H) 98 107(H)  BUN 8 - 27 mg/dL 15 13 12   Creatinine 0.76 - 1.27 mg/dL 1.15 1.28(H) 1.27  Sodium 134 - 144 mmol/L 143 139 140  Potassium 3.5 - 5.2 mmol/L 4.7 5.1 4.8  Chloride 96 - 106 mmol/L 108(H) 105 106  CO2 20 - 29 mmol/L 22 21 20   Calcium 8.6 - 10.2 mg/dL 9.1 9.9 9.8  Total Protein 6.5 - 8.1 g/dL - - -  Total Bilirubin 0.3 - 1.2 mg/dL - - -  Alkaline Phos 38 - 126 U/L - - -  AST 15 - 41 U/L - - -  ALT 0 - 44 U/L - - -   CBC Latest Ref Rng & Units 07/30/2019 07/29/2019 07/29/2019  WBC 4.0 - 10.5 K/uL 4.6 - 4.6  Hemoglobin 13.0 - 17.0 g/dL 13.9 14.3 14.6  Hematocrit 39.0 - 52.0 % 42.9 42.0 44.5  Platelets 150 - 400 K/uL 212 - 233    Lipid Panel Recent Labs    11/30/20 0829  CHOL 154  TRIG 89  LDLCALC 85  HDL 52  LDLDIRECT 84   Lipid Panel     Component Value Date/Time   CHOL 154 11/30/2020 0829   TRIG 89 11/30/2020 0829   HDL 52 11/30/2020 0829   CHOLHDL 2.8 07/30/2019 0551   VLDL 22 07/30/2019 0551   LDLCALC 85 11/30/2020 0829   LDLDIRECT 84 11/30/2020 0829   LABVLDL 17 11/30/2020 0829     HEMOGLOBIN A1C Lab Results  Component Value Date   HGBA1C 6.2 (H) 07/30/2019   MPG 131.24 07/30/2019   TSH No results for input(s): TSH in the last 8760 hours.  External labs:   Labs 08/24/2020:  Total cholesterol 149, triglycerides 97, HDL 52, LDL 69.  Non-HDL cholesterol 97. Medications and allergies   Allergies  Allergen Reactions   Lisinopril Other (See Comments)    Hyper  activity   Other     Halothane///Anesthesia - test was done to show allergy - Son had very bad experience- life threatening      Medication prior to this encounter:  Outpatient Medications Prior to Visit  Medication Sig Dispense Refill   clopidogrel (PLAVIX) 75 MG tablet Take 1 tablet by mouth once daily 90 tablet 0   ENTRESTO 49-51 MG Take 1 tablet by mouth twice daily 180 tablet 0   ezetimibe (ZETIA) 10 MG tablet Take 1 tablet by mouth once daily 90 tablet 0   nitroGLYCERIN (NITROSTAT) 0.4 MG SL tablet Place 0.4 mg under the tongue every 5 (five) minutes as needed for chest pain.      rosuvastatin (CRESTOR) 40 MG tablet TAKE 1 TABLET BY MOUTH AT BEDTIME 90 tablet 0   spironolactone (ALDACTONE) 25 MG tablet TAKE 1 TABLET BY MOUTH IN THE MORNING 90 tablet 0   aspirin EC 81 MG tablet Take 1 tablet (81 mg total) by mouth daily. Swallow whole. 30 tablet 11   carvedilol (COREG) 3.125 MG tablet Take 1 tablet by mouth twice daily 180 tablet 0   hydrALAZINE (APRESOLINE) 10 MG tablet TAKE 1 TABLET BY MOUTH THREE TIMES DAILY 90 tablet 0   No facility-administered medications prior to visit.     Medication list after today's encounter   Current Outpatient Medications  Medication Instructions   carvedilol (COREG) 3.125 mg, Oral, 2 times daily   clopidogrel (PLAVIX) 75 MG tablet Take 1 tablet by mouth once daily   ENTRESTO 49-51 MG Take 1 tablet by mouth twice daily   ezetimibe (ZETIA) 10 MG tablet Take 1 tablet by mouth once daily   nitroGLYCERIN (NITROSTAT) 0.4 mg, Sublingual, Every 5 min PRN   rosuvastatin (CRESTOR) 40 MG tablet TAKE 1 TABLET BY MOUTH AT BEDTIME   spironolactone (ALDACTONE) 25 MG tablet TAKE 1 TABLET BY MOUTH IN THE MORNING    Radiology:    RADIOLOGY:  CTA head and neck 02/21/2021:  Right carotid system: Patent. Atherosclerotic wall thickening along the common carotid. Mixed plaque along the proximal internal carotid causing less than 50% stenosis. Possible mild  fibromuscular dysplasia.   Left carotid system: Patent. Atherosclerotic wall thickening along the common carotid. Mixed plaque along the proximal internal carotid causing less than 50% stenosis. Possible mild fibromuscular dysplasia.  Left M2 MCA occlusion noted on prior MR angiogram 07/29/2019 with nonocclusive thrombus has resolved with mild remaining stenosis.   Otherwise stable appearance. Plaque without hemodynamically significant stenosis in the neck.   Question of a 1 mm right paraclinoid ICA aneurysm.  Cardiac Studies:   CARDIAC DATABASE:   Echocardiogram: 05/05/2018: LVEF 35-40%, with regional wall motion abnormalities, grade 1 diastolic impairment, normal left atrial pressure, mild to moderate TR, RVSP 23 mmHg.  07/29/2019 [facility]): LVEF 35-40%, moderately reduced left ventricular systolic function, regional wall motion abnormalities, grade 1 diastolic impairment, elevated left atrial pressure, PASP 25 mmHg, mildly dilated left atrium, mild MR.   07/30/2019: Limited study with Definity at [facility]. LVEF 40-45%, no mural apical thrombus with septal and apical akinesis.  Heart Catheterization: Coronary Angiogram in [place] Acute anterior MI: Stenting with 3.5 x 12 mm proximal and 3.5 x 32 mm promos Premier DES in the mid LAD. 50-60% mid circumflex stenosis, nondominant right with a mid 85% stenosis.   Coronary Angiogram 02/21/2015 1. Widely patent mid LAD stent, 3.5 x 12 and 3.5 x 32 mm Promus placed on 03/12/2014 for acute MI, type III LAD which supplies essentially the entire anterolateral wall and inferior wall. 2. Moderate stenosis in the mid circumflex, 50%. Nondominant RCA. 3. Ischemic cardiomyopathy, LVEF 30-35% with mid to distal anterior anteroapical, apical, apical inferior, mid inferior  severe hypokinesis to akinesis. 4. Limited abdominal aortogram with bifemoral runoff reveals widely patent iliac vessels, profunda femoral vessel, moderately diseased  bilateral internal iliac, severely diseased bilateral profunda femoral artery.  Peripheral arteriogram 06/27/2015:  Right SFA atherectomy with Turbo Hawk followed by 6x150 mm Lutonix (DCB) angioplasty. 90% to 0%.   Left leg angioplasty 05/30/15: 1.5 mm solid crown CSI atherectomy left SFA, popliteal artery lesions and anterior tibial vessel & drug coated balloon angioplasty to the mid left SFA 95% to 0%. Stenting of proximal and mid popliteal Artery 5.0 x 100 mm Supera self-expanding stent.  EKG:   EKG 12/07/2020: No sinus bradycardia, 52 bpm, old inferior infarct, poor R wave progression, T wave inversions in the inferolateral leads suggestive of possible ischemia.  Without underlying injury pattern.  Similar findings on prior EKG 05/31/2020.    Assessment     ICD-10-CM   1. Arterial fibromuscular dysplasia (HCC) bilateral carotid arteries  I77.3     2. Atherosclerosis of native coronary artery of native heart without angina pectoris  I25.10 PCV ECHOCARDIOGRAM COMPLETE    3. Primary hypertension  Q46 Basic metabolic panel    4. Pure hypercholesterolemia  E78.00 Lipid Panel With LDL/HDL Ratio    5. Chronic heart failure with reduced ejection fraction and diastolic dysfunction (HCC)  I50.42 carvedilol (COREG) 6.25 MG tablet       Medications Discontinued During This Encounter  Medication Reason   aspirin EC 81 MG tablet Discontinued by provider   hydrALAZINE (APRESOLINE) 10 MG tablet    carvedilol (COREG) 3.125 MG tablet Reorder    Meds ordered this encounter  Medications   carvedilol (COREG) 6.25 MG tablet    Sig: Take 0.5 tablets (3.125 mg total) by mouth 2 (two) times daily.    Dispense:  180 tablet    Refill:  3    Orders Placed This Encounter  Procedures   Lipid Panel With LDL/HDL Ratio   Basic metabolic panel   PCV ECHOCARDIOGRAM COMPLETE    Standing Status:   Future    Standing Expiration Date:   03/26/2022    Recommendations:   Shawn Schmidt is a 80 y.o.  African-American male patient with STEMI 03/12/2014 in [place] with 2 overlapping proximal and mid LAD stents, ejection fraction was around 35% at that time, improved on medical therapy, hypertension, hyperlipidemia, PAD bilateral SFA angioplasty in 2017, stage IIIa chronic kidney disease, asymptomatic bilateral carotid artery stenosis, history of thrombotic stroke in June 2021, tobacco use disorder presents for follow-up of carotid stenosis and vascular disease evaluation.  In view of high-grade stenosis noted on the ultrasound, underwent CTA of the head and neck.  I reviewed the results, fortunately left carotid stenosis is not high-grade, only moderate around 50% stenosis is evident.  Patient also has mild FMD bilaterally.  Patient has no residual defects.  I would like to repeat his carotid duplex in 6 months and correlate with duplex and CTA.  With regard to primary hypertension, blood pressure is uncontrolled, he may have renal artery FMD as well or atherosclerotic stenosis of the renal vessels.  During peripheral arteriogram renal angiogram was not performed.  For now increase carvedilol to 6.25 mg p.o. twice daily.  Discontinue hydralazine as he is on minimal dose.  Blood pressure is elevated, will increase carvedilol from 3.125 mg to 6.25 mg p.o. twice daily.  Discontinue hydralazine as he is only taking 10 mg 3 times a day.  We can certainly increase the dose of Entresto if  necessary, I would like to obtain a BMP along with lipid profile testing, there has been a discrepancy between our lipid profile testing done in August compared to 2 months earlier was performed by PCP where the LDL was at goal.  Before making changes to his high-dose high intensity statin Crestor 40 mg along with Zetia I would like to repeat his lipid profile testing.  With regard to coronary artery disease and peripheral arterial disease, he has not had any recurrence of angina pectoris and there is no clinical evidence  of heart failure and he has not had any recurrence of claudication as well.  Unfortunately still smoking about 1 pack of cigarettes a day, smoking cessation discussed.  I would like to repeat echocardiogram to reevaluate his LV systolic function.  I would like to see him back in 3 months for follow-up.    [name], MD, [facility] 03/26/2021, 5:37 PM Office: [phone]

## 2021-03-26 NOTE — Patient Instructions (Signed)
Please get your labs done on empty stomach any day

## 2021-03-27 ENCOUNTER — Ambulatory Visit: Payer: PPO

## 2021-03-27 DIAGNOSIS — I2510 Atherosclerotic heart disease of native coronary artery without angina pectoris: Secondary | ICD-10-CM

## 2021-03-28 LAB — LIPID PANEL WITH LDL/HDL RATIO
Cholesterol, Total: 121 mg/dL (ref 100–199)
HDL: 46 mg/dL (ref >39)
LDL Chol Calc (NIH): 59 mg/dL (ref 0–99)
LDL/HDL Ratio: 1.3 ratio (ref 0.0–3.6)
Triglycerides: 80 mg/dL (ref 0–149)
VLDL Cholesterol Cal: 16 mg/dL (ref 5–40)

## 2021-03-28 LAB — BASIC METABOLIC PANEL
BUN/Creatinine Ratio: 11 (ref 10–24)
BUN: 15 mg/dL (ref 8–27)
CO2: 22 mmol/L (ref 20–29)
Calcium: 9.8 mg/dL (ref 8.6–10.2)
Chloride: 106 mmol/L (ref 96–106)
Creatinine, Ser: 1.38 mg/dL — ABNORMAL HIGH (ref 0.76–1.27)
Glucose: 102 mg/dL — ABNORMAL HIGH (ref 70–99)
Potassium: 5.4 mmol/L — ABNORMAL HIGH (ref 3.5–5.2)
Sodium: 141 mmol/L (ref 134–144)
eGFR: 52 mL/min/{1.73_m2} — ABNORMAL LOW (ref >59)

## 2021-03-28 NOTE — Progress Notes (Signed)
Lipids are under excellent control.

## 2021-03-29 NOTE — Progress Notes (Signed)
Called and spoke with patient regarding his recent lab results.

## 2021-04-07 NOTE — Progress Notes (Signed)
Let him know the heart function is moderately depressed but stable from 2020

## 2021-04-20 NOTE — Progress Notes (Signed)
Called and spoke with patient regarding his most recent echocardiogram results.

## 2021-05-29 ENCOUNTER — Other Ambulatory Visit: Payer: Self-pay | Admitting: Cardiology

## 2021-05-29 DIAGNOSIS — E782 Mixed hyperlipidemia: Secondary | ICD-10-CM

## 2021-05-29 DIAGNOSIS — I5042 Chronic combined systolic (congestive) and diastolic (congestive) heart failure: Secondary | ICD-10-CM

## 2021-05-29 DIAGNOSIS — I6523 Occlusion and stenosis of bilateral carotid arteries: Secondary | ICD-10-CM

## 2021-06-25 ENCOUNTER — Other Ambulatory Visit: Payer: Self-pay

## 2021-06-25 ENCOUNTER — Ambulatory Visit: Payer: PPO | Admitting: Cardiology

## 2021-06-25 ENCOUNTER — Encounter: Payer: Self-pay | Admitting: Cardiology

## 2021-06-25 VITALS — BP 155/66 | HR 58 | Temp 97.8°F | Resp 16 | Ht 70.0 in | Wt 165.0 lb

## 2021-06-25 DIAGNOSIS — I2510 Atherosclerotic heart disease of native coronary artery without angina pectoris: Secondary | ICD-10-CM

## 2021-06-25 DIAGNOSIS — I5042 Chronic combined systolic (congestive) and diastolic (congestive) heart failure: Secondary | ICD-10-CM

## 2021-06-25 DIAGNOSIS — I10 Essential (primary) hypertension: Secondary | ICD-10-CM

## 2021-06-25 DIAGNOSIS — I639 Cerebral infarction, unspecified: Secondary | ICD-10-CM

## 2021-06-25 DIAGNOSIS — I6523 Occlusion and stenosis of bilateral carotid arteries: Secondary | ICD-10-CM

## 2021-06-25 MED ORDER — AMLODIPINE BESYLATE 10 MG PO TABS: 10.0000 mg | ORAL_TABLET | Freq: Every day | ORAL | 3 refills | Status: DC

## 2021-06-25 NOTE — Progress Notes (Signed)
Primary Physician/Referring:  [name], MD  Patient ID: Shawn Schmidt, male    DOB: Jun 21, 1940, 81 y.o.   MRN: [id number]  Chief Complaint  Patient presents with   Cardiomyopathy   Carotid Stenosis   Hyperlipidemia   Hypertension   Follow-up    3 months   HPI:    Shawn Schmidt  is a 81 y.o. African-American male patient with STEMI 03/12/2014 in [place] with 2 overlapping proximal and mid LAD stents, ischemic cardiomyopathy with EF around 5-40%, hypertension, hyperlipidemia, PAD bilateral SFA angioplasty in 2017, stage IIIa chronic kidney disease, asymptomatic bilateral carotid artery stenosis severe stenosis by duplex but moderate stenosis by CTA in November 2022,, history of thrombotic stroke in June 2021, tobacco use disorder presents here for a 4-monthoffice visit.   Tolerating medications well. No complaints today. Denies any symptoms of claudication. He remains active with doing yard work. He does continue to smoke 1 pack every other day, reduced from 1 pack every day 3 months ago.  Past Medical History:  Diagnosis Date   Carotid artery occlusion    CHF (congestive heart failure) (HCC)    Coronary artery disease    HTN (hypertension) 11/27/2018   Hyperlipemia 11/27/2018   Hyperlipidemia    Hypertension    Ischemic cardiomyopathy    Peripheral artery disease ([facility]    Stroke due to embolism of left middle cerebral artery ([facility] 07/2019   Past Surgical History:  Procedure Laterality Date   CARDIAC CATHETERIZATION N/A 02/21/2015   Procedure: Left Heart Cath and Coronary Angiography;  Surgeon: JAdrian Prows MD;  [facility];  Service: Cardiovascular;  Laterality: N/A;   CORONARY ANGIOPLASTY     LIPOMA EXCISION Right    PERIPHERAL VASCULAR CATHETERIZATION Bilateral 04/25/2015   Procedure: Lower Extremity Angiography;  Surgeon: JAdrian Prows MD;  [facility];  Service: Cardiovascular;  Laterality: Bilateral;   PERIPHERAL VASCULAR  CATHETERIZATION Left 05/30/2015   Procedure: Peripheral Vascular Intervention;  Surgeon: JAdrian Prows MD;  [facility];  Service: Cardiovascular;  Laterality: Left;  POPLITEAL   PERIPHERAL VASCULAR CATHETERIZATION Left 05/30/2015   Procedure: Peripheral Vascular Intervention;  Surgeon: JAdrian Prows MD;  [facility];  Service: Cardiovascular;  Laterality: Left;  POPLITEAL   Family History  Problem Relation Age of Onset   Hypertension Mother    Hypertension Father     Social History   Tobacco Use   Smoking status: Some Days    Packs/day: 1.00    Types: Cigarettes   Smokeless tobacco: Never  Substance Use Topics   Alcohol use: Yes    Comment: occ   Marital Status: Widowed  ROS  Review of Systems  Cardiovascular:  Negative for chest pain, claudication, dyspnea on exertion and leg swelling.  Gastrointestinal:  Negative for melena.  Objective  Blood pressure (!) 155/66, pulse (!) 58, temperature 97.8 F (36.6 C), resp. rate 16, height '5\' 10"'$  (1.778 m), weight 165 lb (74.8 kg), SpO2 96 %. Body mass index is 23.68 kg/m.  Vitals with BMI 06/25/2021 03/26/2021 02/08/2021  Height '5\' 10"'$  '5\' 10"'$  '5\' 10"'$   Weight 165 lbs 166 lbs 165 lbs 13 oz  BMI 23.68 [phone] Systolic [phone] Diastolic 66 72 77  Pulse 58 62 59    Physical Exam Neck:     Vascular: No carotid bruit or JVD.  Cardiovascular:     Rate and Rhythm: Normal rate and regular  rhythm.     Pulses: Intact distal pulses.          Carotid pulses are 2+ on the right side and 2+ on the left side.      Femoral pulses are 2+ on the right side and 2+ on the left side.      Popliteal pulses are 2+ on the right side and 0 on the left side.       Dorsalis pedis pulses are 2+ on the right side and 0 on the left side.       Posterior tibial pulses are 1+ on the right side and 0 on the left side.     Heart sounds: Normal heart sounds. No murmur heard.   No gallop.  Pulmonary:     Effort: Pulmonary  effort is normal.     Breath sounds: Normal breath sounds.  Abdominal:     General: Bowel sounds are normal.     Palpations: Abdomen is soft.  Musculoskeletal:        General: No swelling.     Right lower leg: No edema.     Left lower leg: No edema.     Laboratory examination:   Recent Labs    11/30/20 0829 02/15/21 0826 03/27/21 1034  NA 139 143 141  K 5.1 4.7 5.4*  CL 105 108* 106  CO2 '21 22 22  '$ GLUCOSE 98 101* 102*  BUN '13 15 15  '$ CREATININE 1.28* 1.15 1.38*  CALCIUM 9.9 9.1 9.8   CrCl cannot be calculated (Patient's most recent lab result is older than the maximum 21 days allowed.).  CMP Latest Ref Rng & Units 03/27/2021 02/15/2021 11/30/2020  Glucose 70 - 99 mg/dL 102(H) 101(H) 98  BUN 8 - 27 mg/dL '15 15 13  '$ Creatinine 0.76 - 1.27 mg/dL 1.38(H) 1.15 1.28(H)  Sodium 134 - 144 mmol/L 141 143 139  Potassium 3.5 - 5.2 mmol/L 5.4(H) 4.7 5.1  Chloride 96 - 106 mmol/L 106 108(H) 105  CO2 20 - 29 mmol/L '22 22 21  '$ Calcium 8.6 - 10.2 mg/dL 9.8 9.1 9.9  Total Protein 6.5 - 8.1 g/dL - - -  Total Bilirubin 0.3 - 1.2 mg/dL - - -  Alkaline Phos 38 - 126 U/L - - -  AST 15 - 41 U/L - - -  ALT 0 - 44 U/L - - -   CBC Latest Ref Rng & Units 07/30/2019 07/29/2019 07/29/2019  WBC 4.0 - 10.5 K/uL 4.6 - 4.6  Hemoglobin 13.0 - 17.0 g/dL 13.9 14.3 14.6  Hematocrit 39.0 - 52.0 % 42.9 42.0 44.5  Platelets 150 - 400 K/uL 212 - 233    Lipid Panel Lab Results  Component Value Date   CHOL 121 03/27/2021   HDL 46 03/27/2021   LDLCALC 59 03/27/2021   LDLDIRECT 84 11/30/2020   TRIG 80 03/27/2021   CHOLHDL 2.8 07/30/2019    External labs:   Labs 08/24/2020:  Total cholesterol 149, triglycerides 97, HDL 52, LDL 69.  Non-HDL cholesterol 97. Medications and allergies   Allergies  Allergen Reactions   Lisinopril Other (See Comments)    Hyper activity   Other     Halothane///Anesthesia - test was done to show allergy - Son had very bad experience- life threatening     Medication list  after today's encounter   Current Outpatient Medications:    amLODipine (NORVASC) 10 MG tablet, Take 1 tablet (10 mg total) by mouth daily., Disp: 90 tablet, Rfl: 3   carvedilol (  COREG) 6.25 MG tablet, Take 1 tablet (6.25 mg total) by mouth 2 (two) times daily., Disp: 180 tablet, Rfl: 3   clopidogrel (PLAVIX) 75 MG tablet, Take 1 tablet by mouth once daily, Disp: 90 tablet, Rfl: 0   ENTRESTO 49-51 MG, Take 1 tablet by mouth twice daily, Disp: 180 tablet, Rfl: 0   ezetimibe (ZETIA) 10 MG tablet, Take 1 tablet by mouth once daily, Disp: 90 tablet, Rfl: 0   nitroGLYCERIN (NITROSTAT) 0.4 MG SL tablet, Place 0.4 mg under the tongue every 5 (five) minutes as needed for chest pain. , Disp: , Rfl:    rosuvastatin (CRESTOR) 40 MG tablet, TAKE 1 TABLET BY MOUTH AT BEDTIME, Disp: 90 tablet, Rfl: 0   spironolactone (ALDACTONE) 25 MG tablet, TAKE 1 TABLET BY MOUTH IN THE MORNING, Disp: 90 tablet, Rfl: 0   Radiology:    RADIOLOGY:  CTA head and neck 02/21/2021:  Right carotid system: Patent. Atherosclerotic wall thickening along the common carotid. Mixed plaque along the proximal internal carotid causing less than 50% stenosis. Possible mild fibromuscular dysplasia.   Left carotid system: Patent. Atherosclerotic wall thickening along the common carotid. Mixed plaque along the proximal internal carotid causing less than 50% stenosis. Possible mild fibromuscular dysplasia.  Left M2 MCA occlusion noted on prior MR angiogram 07/29/2019 with nonocclusive thrombus has resolved with mild remaining stenosis.   Otherwise stable appearance. Plaque without hemodynamically significant stenosis in the neck.   Question of a 1 mm right paraclinoid ICA aneurysm.  Cardiac Studies:   CARDIAC DATABASE:   Heart Catheterization: Coronary Angiogram in [place] Acute anterior MI 03/12/2014: Stenting with 3.5 x 12 mm proximal and 3.5 x 32 mm promos Premier DES in the mid LAD. 50-60% mid circumflex stenosis,  nondominant right with a mid 85% stenosis.   Coronary Angiogram 02/21/2015 1. Widely patent mid LAD stent, 3.5 x 12 and 3.5 x 32 mm Promus placed on 03/12/2014 for acute MI, type III LAD which supplies essentially the entire anterolateral wall and inferior wall. 2. Moderate stenosis in the mid circumflex, 50%. Nondominant RCA. 3. Ischemic cardiomyopathy, LVEF 30-35% with mid to distal anterior anteroapical, apical, apical inferior, mid inferior  severe hypokinesis to akinesis. 4. Limited abdominal aortogram with bifemoral runoff reveals widely patent iliac vessels, profunda femoral vessel, moderately diseased bilateral internal iliac, severely diseased bilateral profunda femoral artery.  Peripheral arteriogram 06/27/2015:  Right SFA atherectomy with Turbo Hawk followed by 6x150 mm Lutonix (DCB) angioplasty. 90% to 0%.   Left leg angioplasty 05/30/15: 1.5 mm solid crown CSI atherectomy left SFA, popliteal artery lesions and anterior tibial vessel & drug coated balloon angioplasty to the mid left SFA 95% to 0%. Stenting of proximal and mid popliteal Artery 5.0 x 100 mm Supera self-expanding stent.  Lower Extremity Arterial Duplex 01/05/2021: No hemodynamically significant stenoses are identified in the right o rleft lower extremity arterial system.  This exam reveals mildly decreased perfusion of the right lower extremity, noted at the dorsalis pedis artery level (ABI 0.80) and moderately decreased perfusion of the left lower extremity, noted at the dorsalis pedis artery level (ABI 0.74).  Right PT is diffusely diseased. The left PT appears to be severely diseased or occluded with collateral flow. Study suggest diffuse small vessel disease below knee. Compared to the study done on 08/04/2015, no significant change.  Study also suggest patent bilateral angioplasty site at the SFA and left popliteal artery to be patent.  Carotid artery duplex 11/28/2020: Duplex suggests stenosis in the right internal  carotid artery (50-69%). The right PSV internal/common carotid artery ratio of 7.39 is consistent with a stenosis of >70%. Duplex suggests stenosis in the left internal carotid artery (16-49%). Antegrade right vertebral artery flow. Antegrade left vertebral artery flow. Compared to 05/19/2020, no significant change, however right ICA/CCA ratio of 7.39 suggests >70% stenosis is new.  Suspect stenosis in the upper range of 50 to 69%. Follow up in six months is appropriate if clinically indicated.  PCV ECHOCARDIOGRAM COMPLETE 03/27/2021  Narrative Echocardiogram 03/27/2021: 1. Left ventricle cavity is normal in size. Mild concentric ypertrophy of the left ventricle. Moderate global hypokinesis. Mid to distal anteroseptal/anterior ypokinesis. Apical akinesis. LVEF 35-40%. No evidence of LV mural thrombus on this non-contrast study. Doppler evidence of grade I (impaired) diastolic dysfunction. 2. Mild tricuspid regurgitation. Estimated pulmonary artery systolic pressure 29 mmHg. 3. No significant change compared to previous study in 2020.     EKG:   EKG 12/07/2020: No sinus bradycardia, 52 bpm, old inferior infarct, poor R wave progression, T wave inversions in the inferolateral leads suggestive of possible ischemia.  Without underlying injury pattern.  Similar findings on prior EKG 05/31/2020.    Assessment     ICD-10-CM   1. Atherosclerosis of native coronary artery of native heart without angina pectoris  I25.10 Lipid Panel With LDL/HDL Ratio    LDL cholesterol, direct    2. Chronic heart failure with reduced ejection fraction and diastolic dysfunction (HCC)  I50.42     3. Bilateral carotid artery stenosis  I65.23 PCV CAROTID DUPLEX (BILATERAL)    4. Primary hypertension  I10 amLODipine (NORVASC) 10 MG tablet    Basic metabolic panel    5. Cryptogenic stroke (HCC)  I63.9        There are no discontinued medications.   Meds ordered this encounter  Medications   amLODipine (NORVASC)  10 MG tablet    Sig: Take 1 tablet (10 mg total) by mouth daily.    Dispense:  90 tablet    Refill:  3    Orders Placed This Encounter  Procedures   Lipid Panel With LDL/HDL Ratio   LDL cholesterol, direct   Basic metabolic panel    Recommendations:   AMRON GUERRETTE is a 81 y.o. African-American male patient with STEMI 03/12/2014 in [place] with 2 overlapping proximal and mid LAD stents, ischemic cardiomyopathy with EF around 5-40%, hypertension, hyperlipidemia, PAD bilateral SFA angioplasty in 2017, stage IIIa chronic kidney disease, asymptomatic bilateral carotid artery stenosis severe stenosis by duplex but moderate stenosis by CTA in November 2022,, history of thrombotic stroke in June 2021, tobacco use disorder presents here for a 41-monthoffice visit.  He needs repeat carotid artery duplex to have surveillance.  His blood pressure is elevated, will add amlodipine 10 mg daily.  Cannot go up on the dose of the Entresto as he is got underlying chronic kidney disease stage IIIa and also has hyperkalemia.  He is on moderate dose of carvedilol and in view of his age and underlying bradycardia cannot go up on the dose of the carvedilol as well.  I reviewed the results of the direct LDL which is disproportionately high compared to calculated LDL.  Dietary changes discussed, he is presently on Crestor 40 mg along with Zetia 10 mg daily, if LDL is not at goal, could consider switching him to [id number].  I would like to see him back in 2 months for follow-up with BMP, lipids and carotid duplex.  With regard to peripheral arterial  disease, no change in vascular exam, denies symptoms of claudication although he has absent left leg pulses.     [name], MD, [facility] 06/25/2021, 9:35 AM Office: [phone]

## 2021-08-14 ENCOUNTER — Ambulatory Visit: Payer: PPO

## 2021-08-14 DIAGNOSIS — I6523 Occlusion and stenosis of bilateral carotid arteries: Secondary | ICD-10-CM

## 2021-08-15 LAB — BASIC METABOLIC PANEL
BUN/Creatinine Ratio: 11 (ref 10–24)
BUN: 15 mg/dL (ref 8–27)
CO2: 23 mmol/L (ref 20–29)
Calcium: 9.5 mg/dL (ref 8.6–10.2)
Chloride: 104 mmol/L (ref 96–106)
Creatinine, Ser: 1.32 mg/dL — ABNORMAL HIGH (ref 0.76–1.27)
Glucose: 109 mg/dL — ABNORMAL HIGH (ref 70–99)
Potassium: 4.9 mmol/L (ref 3.5–5.2)
Sodium: 139 mmol/L (ref 134–144)
eGFR: 55 mL/min/{1.73_m2} — ABNORMAL LOW (ref >59)

## 2021-08-15 LAB — LIPID PANEL WITH LDL/HDL RATIO
Cholesterol, Total: 113 mg/dL (ref 100–199)
HDL: 48 mg/dL (ref >39)
LDL Chol Calc (NIH): 51 mg/dL (ref 0–99)
LDL/HDL Ratio: 1.1 ratio (ref 0.0–3.6)
Triglycerides: 66 mg/dL (ref 0–149)
VLDL Cholesterol Cal: 14 mg/dL (ref 5–40)

## 2021-08-15 LAB — LDL CHOLESTEROL, DIRECT: LDL Direct: 49 mg/dL (ref 0–99)

## 2021-08-21 ENCOUNTER — Other Ambulatory Visit: Payer: Self-pay | Admitting: Cardiology

## 2021-08-21 DIAGNOSIS — I6523 Occlusion and stenosis of bilateral carotid arteries: Secondary | ICD-10-CM

## 2021-08-21 DIAGNOSIS — E782 Mixed hyperlipidemia: Secondary | ICD-10-CM

## 2021-08-21 DIAGNOSIS — I5042 Chronic combined systolic (congestive) and diastolic (congestive) heart failure: Secondary | ICD-10-CM

## 2021-08-26 NOTE — Progress Notes (Signed)
? ?Primary Physician/Referring:  [name], MD ? ?Patient ID: Shawn Schmidt, male    DOB: 08/06/1940, 81 y.o.   MRN: [id number] ? ?Chief Complaint  ?Patient presents with  ? Hypertension  ? Hyperlipidemia  ? CAROTID STENOSIS  ? ?HPI:   ? ?Shawn Schmidt  is a 81 y.o. African-American male patient with STEMI 03/12/2014 in [place] with 2 overlapping proximal and mid LAD stents, ischemic cardiomyopathy with EF around 5-40%, hypertension, hyperlipidemia, PAD bilateral SFA angioplasty in 2017, stage IIIa chronic kidney disease, asymptomatic bilateral carotid artery stenosis severe stenosis by duplex but moderate stenosis by CTA in November 2022,, history of thrombotic stroke in June 2021, tobacco use disorder presents here for a 34-month office visit. ?  ?Tolerating medications well.  No side effects from increasing the dose of the amlodipine to 10 mg.  No complaints today. Denies any symptoms of claudication. He remains active with doing yard work. He does continue to smoke 1 pack every other day, reduced from 1 pack every day 3 months ago. ? ?Past Medical History:  ?Diagnosis Date  ? Carotid artery occlusion   ? CHF (congestive heart failure) ([place])   ? Coronary artery disease   ? HTN (hypertension) 11/27/2018  ? Hyperlipemia 11/27/2018  ? Hyperlipidemia   ? Hypertension   ? Ischemic cardiomyopathy   ? Peripheral artery disease ([place])   ? Stroke due to embolism of left middle cerebral artery ([place]) 07/2019  ? ?Past Surgical History:  ?Procedure Laterality Date  ? CARDIAC CATHETERIZATION N/A 02/21/2015  ? Procedure: Left Heart Cath and Coronary Angiography;  Surgeon: [name], MD;  Location: [facility];  Service: Cardiovascular;  Laterality: N/A;  ? CORONARY ANGIOPLASTY    ? LIPOMA EXCISION Right   ? PERIPHERAL VASCULAR CATHETERIZATION Bilateral 04/25/2015  ? Procedure: Lower Extremity Angiography;  Surgeon: [name], MD;  Location: [place];  Service: Cardiovascular;  Laterality: Bilateral;   ? PERIPHERAL VASCULAR CATHETERIZATION Left 05/30/2015  ? Procedure: Peripheral Vascular Intervention;  Surgeon: [name], MD;  Location: [facility];  Service: Cardiovascular;  Laterality: Left;  POPLITEAL  ? PERIPHERAL VASCULAR CATHETERIZATION Left 05/30/2015  ? Procedure: Peripheral Vascular Intervention;  Surgeon: [name], MD;  Location: [facility];  Service: Cardiovascular;  Laterality: Left;  POPLITEAL  ? ?Family History  ?Problem Relation Age of Onset  ? Hypertension Mother   ? Hypertension Father   ?  ?Social History  ? ?Tobacco Use  ? Smoking status: Some Days  ?  Packs/day: 1.00  ?  Types: Cigarettes  ? Smokeless tobacco: Never  ?Substance Use Topics  ? Alcohol use: Yes  ?  Comment: occ  ? ?Marital Status: Widowed  ?ROS  ?Review of Systems  ?Cardiovascular:  Negative for chest pain, claudication, dyspnea on exertion and leg swelling.  ?Gastrointestinal:  Negative for melena.  ?Objective  ?Blood pressure 132/66, pulse (!) 55, temperature 97.6 ?F (36.4 ?C), temperature source Temporal, resp. rate 17, height $RemoveBe'5\' 10"'qOFqmDQJA$  (1.778 m), weight 160 lb (72.6 kg), SpO2 95 %. Body mass index is 22.96 kg/m?.  ? ?  08/27/2021  ?  9:08 AM 06/25/2021  ?  8:21 AM 03/26/2021  ?  8:21 AM  ?Vitals with BMI  ?Height $Remove'5\' 10"'fZhyREW$  $RemoveBe'5\' 10"'mURWfTUXl$  $RemoveBefo'5\' 10"'sCYteZssvZp$   ?Weight 160 lbs 165 lbs 166 lbs  ?BMI 22.96 23.68 23.82  ?Systolic 092 330 076  ?Diastolic 66 66 72  ?Pulse 55 58 62  ?  ?Physical Exam ?Neck:  ?  Vascular: No carotid bruit or JVD.  ?Cardiovascular:  ?   Rate and Rhythm: Normal rate and regular rhythm.  ?   Pulses: Intact distal pulses.     ?     Carotid pulses are 2+ on the right side and 2+ on the left side. ?     Femoral pulses are 2+ on the right side and 2+ on the left side. ?     Popliteal pulses are 2+ on the right side and 0 on the left side.  ?     Dorsalis pedis pulses are 2+ on the right side and 0 on the left side.  ?     Posterior tibial pulses are 1+ on the right side and 0 on the left side.  ?   Heart sounds: Normal  heart sounds. No murmur heard. ?  No gallop.  ?Pulmonary:  ?   Effort: Pulmonary effort is normal.  ?   Breath sounds: Normal breath sounds.  ?Abdominal:  ?   General: Bowel sounds are normal.  ?   Palpations: Abdomen is soft.  ?Musculoskeletal:     ?   General: No swelling.  ?   Right lower leg: No edema.  ?   Left lower leg: No edema.  ?  ? ?Laboratory examination:  ? ?Recent Labs  ?  02/15/21 ?4680 03/27/21 ?1034 08/14/21 ?0855  ?NA 143 141 139  ?K 4.7 5.4* 4.9  ?CL 108* 106 104  ?CO2 $Rem'22 22 23  'DXFM$ ?GLUCOSE 101* 102* 109*  ?BUN $Rem'15 15 15  'WrQy$ ?CREATININE 1.15 1.38* 1.32*  ?CALCIUM 9.1 9.8 9.5  ?eGFR 68mL ? ?estimated creatinine clearance is 47.4 mL/min (A) (by C-G formula based on SCr of 1.32 mg/dL (H)).  ? ?  Latest Ref Rng & Units 08/14/2021  ?  8:55 AM 03/27/2021  ? 10:34 AM 02/15/2021  ?  8:26 AM  ?CMP  ?Glucose 70 - 99 mg/dL 109   102   101    ?BUN 8 - 27 mg/dL $Remove'15   15   15    'SYgujQV$ ?Creatinine 0.76 - 1.27 mg/dL 1.32   1.38   1.15    ?Sodium 134 - 144 mmol/L 139   141   143    ?Potassium 3.5 - 5.2 mmol/L 4.9   5.4   4.7    ?Chloride 96 - 106 mmol/L 104   106   108    ?CO2 20 - 29 mmol/L $RemoveB'23   22   22    'ShcFWKYG$ ?Calcium 8.6 - 10.2 mg/dL 9.5   9.8   9.1    ? ? ?  Latest Ref Rng & Units 07/30/2019  ?  5:51 AM 07/29/2019  ? 12:06 PM 07/29/2019  ? 11:58 AM  ?CBC  ?WBC 4.0 - 10.5 K/uL 4.6    4.6    ?Hemoglobin 13.0 - 17.0 g/dL 13.9   14.3   14.6    ?Hematocrit 39.0 - 52.0 % 42.9   42.0   44.5    ?Platelets 150 - 400 K/uL 212    233    ? ? ?Lipid Panel ?Lab Results  ?Component Value Date  ? CHOL 113 08/14/2021  ? HDL 48 08/14/2021  ? [place] 51 08/14/2021  ? LDLDIRECT 49 08/14/2021  ? TRIG 66 08/14/2021  ? CHOLHDL 2.8 07/30/2019  ?  ?External labs:  ? ?Labs 08/24/2020: ? ?Total cholesterol 149, triglycerides 97, HDL 52, LDL 69.  Non-HDL cholesterol 97. ?Medications and allergies  ? ?Allergies  ?  Allergen Reactions  ? Lisinopril Other (See Comments)  ?  Hyper activity  ? Other   ?  Halothane///Anesthesia - test was done to show allergy - Son had  very bad experience- life threatening   ?  ?Medication list after today's encounter  ? ?Current Outpatient Medications:  ?  amLODipine (NORVASC) 10 MG tablet, Take 1 tablet (10 mg total) by mouth daily., Disp: 90 tablet, Rfl: 3 ?  carvedilol (COREG) 6.25 MG tablet, Take 1 tablet (6.25 mg total) by mouth 2 (two) times daily., Disp: 180 tablet, Rfl: 3 ?  clopidogrel (PLAVIX) 75 MG tablet, Take 1 tablet by mouth once daily, Disp: 90 tablet, Rfl: 0 ?  ENTRESTO 49-51 MG, Take 1 tablet by mouth twice daily, Disp: 180 tablet, Rfl: 0 ?  ezetimibe (ZETIA) 10 MG tablet, Take 1 tablet by mouth once daily, Disp: 90 tablet, Rfl: 0 ?  nitroGLYCERIN (NITROSTAT) 0.4 MG SL tablet, Place 0.4 mg under the tongue every 5 (five) minutes as needed for chest pain. , Disp: , Rfl:  ?  rosuvastatin (CRESTOR) 40 MG tablet, TAKE 1 TABLET BY MOUTH AT BEDTIME, Disp: 90 tablet, Rfl: 0 ?  spironolactone (ALDACTONE) 25 MG tablet, TAKE 1 TABLET BY MOUTH IN THE MORNING, Disp: 90 tablet, Rfl: 0  ? ?Radiology:  ? ? ?RADIOLOGY: ? ?CTA head and neck 02/21/2021: ? ?Right carotid system: Patent. Atherosclerotic wall thickening along ?the common carotid. Mixed plaque along the proximal internal carotid ?causing less than 50% stenosis. Possible mild fibromuscular ?dysplasia. ?  ?Left carotid system: Patent. Atherosclerotic wall thickening along ?the common carotid. Mixed plaque along the proximal internal carotid ?causing less than 50% stenosis. Possible mild fibromuscular ?dysplasia. ? ?Left M2 MCA occlusion noted on prior MR angiogram 07/29/2019 with nonocclusive thrombus has resolved with mild remaining stenosis. ?  ?Otherwise stable appearance. Plaque without hemodynamically ?significant stenosis in the neck. ?  ?Question of a 1 mm right paraclinoid ICA aneurysm. ? ?Cardiac Studies:  ? ?CARDIAC DATABASE:  ? ?Heart Catheterization: ?Coronary Angiogram in [place] Acute anterior MI 03/12/2014: Stenting with 3.5 x 12 mm proximal and 3.5 x 32 mm promos  Premier DES in the mid LAD. 50-60% mid circumflex stenosis, nondominant right with a mid 85% stenosis. ?  ?Coronary Angiogram 02/21/2015 ?1. Widely patent mid LAD stent, 3.5 x 12 and 3.5 x 32 mm Promus p

## 2021-08-27 ENCOUNTER — Encounter: Payer: Self-pay | Admitting: Cardiology

## 2021-08-27 ENCOUNTER — Ambulatory Visit: Payer: PPO | Admitting: Cardiology

## 2021-08-27 VITALS — BP 132/66 | HR 55 | Temp 97.6°F | Resp 17 | Ht 70.0 in | Wt 160.0 lb

## 2021-08-27 DIAGNOSIS — I6523 Occlusion and stenosis of bilateral carotid arteries: Secondary | ICD-10-CM

## 2021-08-27 DIAGNOSIS — E7800 Pure hypercholesterolemia, unspecified: Secondary | ICD-10-CM

## 2021-08-27 DIAGNOSIS — I10 Essential (primary) hypertension: Secondary | ICD-10-CM

## 2021-08-27 DIAGNOSIS — I5042 Chronic combined systolic (congestive) and diastolic (congestive) heart failure: Secondary | ICD-10-CM

## 2021-08-27 DIAGNOSIS — N1831 Chronic kidney disease, stage 3a: Secondary | ICD-10-CM

## 2021-09-13 ENCOUNTER — Other Ambulatory Visit: Payer: Self-pay | Admitting: Cardiology

## 2021-09-13 DIAGNOSIS — I6523 Occlusion and stenosis of bilateral carotid arteries: Secondary | ICD-10-CM

## 2021-09-13 DIAGNOSIS — E782 Mixed hyperlipidemia: Secondary | ICD-10-CM

## 2021-11-19 ENCOUNTER — Other Ambulatory Visit: Payer: Self-pay | Admitting: Cardiology

## 2021-11-19 DIAGNOSIS — I5042 Chronic combined systolic (congestive) and diastolic (congestive) heart failure: Secondary | ICD-10-CM

## 2021-11-19 DIAGNOSIS — I6523 Occlusion and stenosis of bilateral carotid arteries: Secondary | ICD-10-CM

## 2021-11-19 DIAGNOSIS — E782 Mixed hyperlipidemia: Secondary | ICD-10-CM

## 2021-12-03 ENCOUNTER — Other Ambulatory Visit: Payer: Self-pay | Admitting: Cardiology

## 2021-12-03 DIAGNOSIS — I6523 Occlusion and stenosis of bilateral carotid arteries: Secondary | ICD-10-CM

## 2021-12-03 DIAGNOSIS — E782 Mixed hyperlipidemia: Secondary | ICD-10-CM

## 2022-01-08 IMAGING — CT CT ANGIO HEAD-NECK (W OR W/O PERF)
1 of 11 series · 5 of 33 positions shown · IV contrast (omnipaque)
Comparison: 3633

CLINICAL DATA: Bilateral carotid artery stenosis

EXAM:
CT ANGIOGRAPHY HEAD AND NECK
TECHNIQUE: Multidetector CT imaging of the head and neck was performed using
the standard protocol during bolus administration of intravenous
contrast. Multiplanar CT image reconstructions and MIPs were
obtained to evaluate the vascular anatomy. Carotid stenosis
measurements (when applicable) are obtained utilizing NASCET
criteria, using the distal internal carotid diameter as the
denominator.
CONTRAST:  50mL OMNIPAQUE IOHEXOL 350 MG/ML SOLN

[Series 12: cta neck axial · axial · 0.39mm/px · z∈[-175,+54]mm · 5 of 345 slices shown]
[im 58/345  soft-tissue]
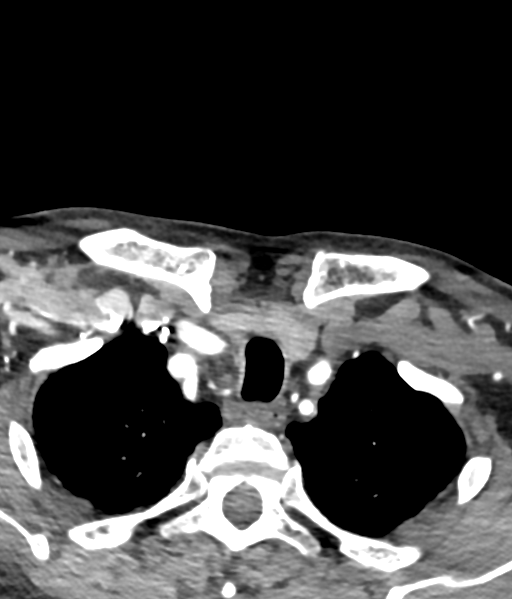
[im 115/345  bone]
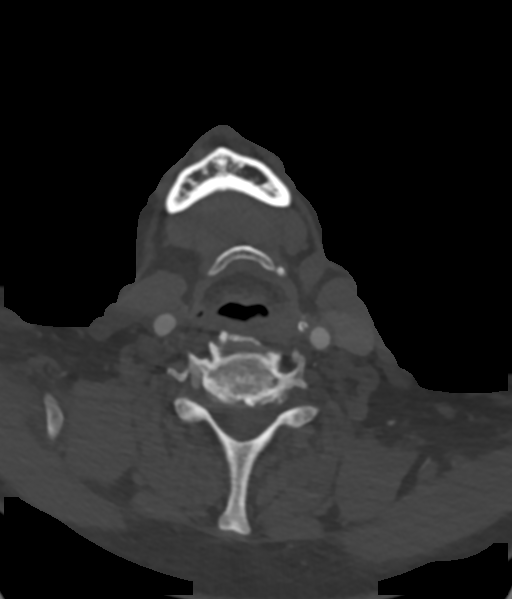
[im 173/345  soft-tissue]
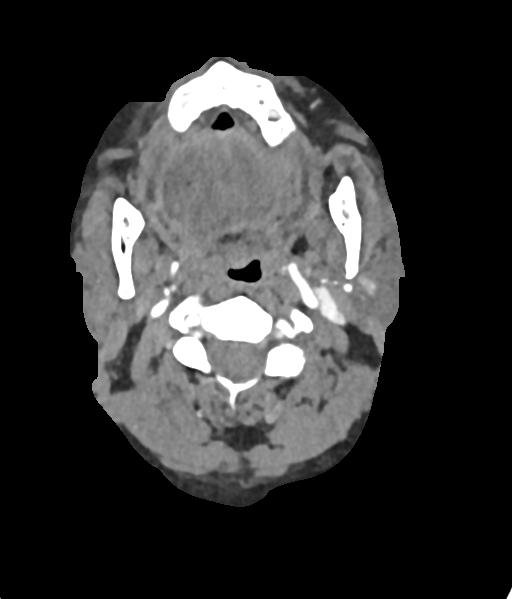
[im 230/345  bone]
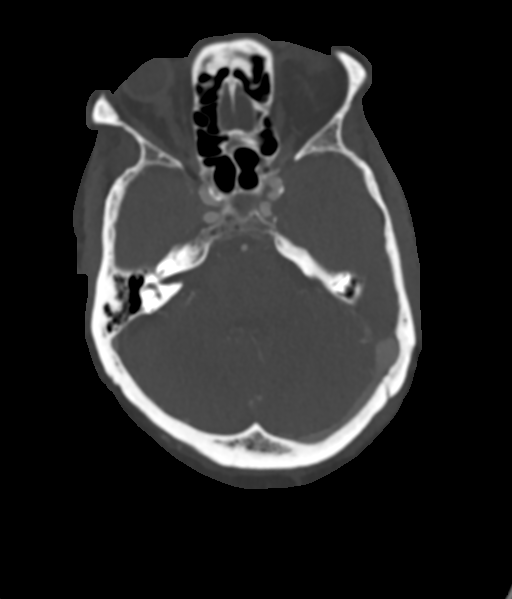
[im 287/345  soft-tissue]
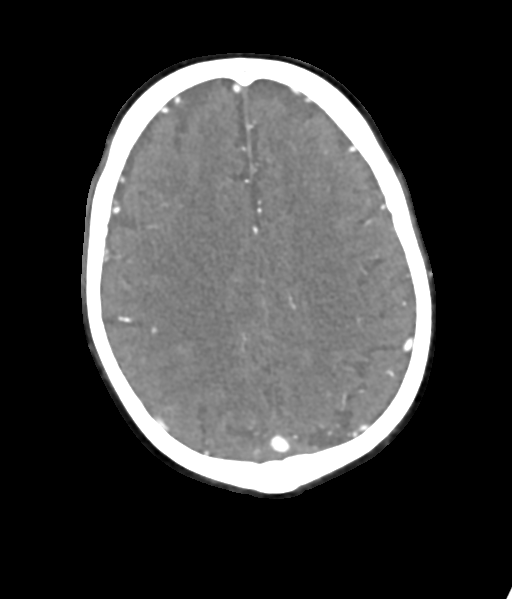

[5 of 33 positions shown; findings below may reference images not displayed]

FINDINGS: CT HEAD

Brain: There is no acute intracranial hemorrhage, mass effect, or
edema. No acute appearing loss of gray-white differentiation.
Chronic infarct of the left temporal lobe. Additional patchy
low-density in the supratentorial white matter is nonspecific but
may reflect minor chronic microvascular ischemic changes. There is
no extra-axial fluid collection. Ventricles and sulci are stable in
size and configuration.

Vascular: Better evaluated on CTA portion.

Skull: Calvarium is unremarkable.

Sinuses/Orbits: No acute finding.

Other: None.

Review of the MIP images confirms the above findings

CTA NECK

Aortic arch: Mixed plaque along the arch and patent great vessel
origins.

Right carotid system: Patent. Atherosclerotic wall thickening along
the common carotid. Mixed plaque along the proximal internal carotid
causing less than 50% stenosis. Possible mild fibromuscular
dysplasia.

Left carotid system: Patent. Atherosclerotic wall thickening along
the common carotid. Mixed plaque along the proximal internal carotid
causing less than 50% stenosis. Possible mild fibromuscular
dysplasia.

Vertebral arteries: Patent. Right vertebral artery is dominant. Left
vertebral artery arises directly from the arch. Plaque is present
along the proximal left V1 segment with mild stenosis.

Skeleton: Advanced degenerative changes of the cervical spine.

Other neck: Unremarkable.

Upper chest: Emphysema.

Review of the MIP images confirms the above findings

CTA HEAD

Anterior circulation: Intracranial internal carotid arteries are
patent with calcified plaque. There is mild to moderate stenosis in
the paraclinoid regions. There is a 1 mm anteromedially directed
outpouching from the paraclinoid right ICA (series 12, image 110).
Anterior and middle cerebral arteries are patent. Specifically, left
M2 occlusion on the prior study has resolved with residual mild
stenosis.

Posterior circulation: Intracranial vertebral arteries are patent.
Basilar artery is patent. Major cerebellar artery origins are
patent. Posterior cerebral arteries are patent.

Venous sinuses: Patent as allowed by contrast bolus timing.

Review of the MIP images confirms the above findings
IMPRESSION: Left M2 MCA occlusion has resolved with mild remaining stenosis.

Otherwise stable appearance. Plaque without hemodynamically
significant stenosis in the neck.

Question of a 1 mm right paraclinoid ICA aneurysm.

No acute intracranial abnormality.

## 2022-02-18 ENCOUNTER — Other Ambulatory Visit: Payer: Self-pay | Admitting: Cardiology

## 2022-02-18 DIAGNOSIS — I5042 Chronic combined systolic (congestive) and diastolic (congestive) heart failure: Secondary | ICD-10-CM

## 2022-02-18 DIAGNOSIS — E782 Mixed hyperlipidemia: Secondary | ICD-10-CM

## 2022-02-18 DIAGNOSIS — I6523 Occlusion and stenosis of bilateral carotid arteries: Secondary | ICD-10-CM

## 2022-02-27 ENCOUNTER — Ambulatory Visit: Payer: PPO

## 2022-02-27 DIAGNOSIS — I6523 Occlusion and stenosis of bilateral carotid arteries: Secondary | ICD-10-CM

## 2022-02-28 ENCOUNTER — Other Ambulatory Visit: Payer: Self-pay | Admitting: Cardiology

## 2022-02-28 DIAGNOSIS — I5042 Chronic combined systolic (congestive) and diastolic (congestive) heart failure: Secondary | ICD-10-CM

## 2022-03-01 NOTE — Progress Notes (Signed)
Carotid artery duplex 02/27/2022: Duplex suggests stenosis in the right internal carotid artery (50-69%). Duplex suggests stenosis in the left internal carotid artery (50-69%). Moderate heterogeneous plaque bilateral ICA.  Antegrade right vertebral artery flow. Antegrade left vertebral artery flow. No significant change compared to 08/14/2021. Follow up in six months is appropriate if clinically indicated.   Discuss on OV soon

## 2022-03-04 ENCOUNTER — Encounter: Payer: Self-pay | Admitting: Cardiology

## 2022-03-04 ENCOUNTER — Ambulatory Visit: Payer: PPO | Admitting: Cardiology

## 2022-03-04 VITALS — BP 104/63 | HR 59 | Temp 97.1°F | Resp 16 | Ht 70.0 in | Wt 156.0 lb

## 2022-03-04 DIAGNOSIS — I6523 Occlusion and stenosis of bilateral carotid arteries: Secondary | ICD-10-CM

## 2022-03-04 DIAGNOSIS — Z720 Tobacco use: Secondary | ICD-10-CM

## 2022-03-04 DIAGNOSIS — I2510 Atherosclerotic heart disease of native coronary artery without angina pectoris: Secondary | ICD-10-CM

## 2022-03-04 DIAGNOSIS — I5042 Chronic combined systolic (congestive) and diastolic (congestive) heart failure: Secondary | ICD-10-CM

## 2022-03-04 DIAGNOSIS — E7800 Pure hypercholesterolemia, unspecified: Secondary | ICD-10-CM

## 2022-03-04 NOTE — Progress Notes (Signed)
Primary Physician/Referring:  [name], MD  Patient ID: Shawn Schmidt, male    DOB: 29-Apr-1940, 81 y.o.   MRN: [id number]  Chief Complaint  Patient presents with   Carotid Stenosis   Hypertension   Coronary Artery Disease   PAD   Follow-up    6 months   HPI:    Shawn Schmidt  is a 81 y.o. African-American male patient with STEMI 03/12/2014 in [place] with 2 overlapping proximal and mid LAD stents, ischemic cardiomyopathy with EF around 35-40%, hypertension, hyperlipidemia, PAD bilateral SFA angioplasty in 2017, stage IIIa chronic kidney disease, asymptomatic bilateral carotid artery stenosis, history of thrombotic stroke in June 2021, tobacco use disorder presents here for a 56-monthoffice visit.   Tolerating medications well.  No complaints today. Denies any symptoms of claudication. He remains active with doing yard work, states that he does not push himself too much and stops and takes frequent breaks but no angina pectoris, has not noticed any worsening dyspnea. He does continue to smoke 1.5 pack every other day.  Past Medical History:  Diagnosis Date   Carotid artery occlusion    CHF (congestive heart failure) (HCC)    Coronary artery disease    HTN (hypertension) 11/27/2018   Hyperlipemia 11/27/2018   Hyperlipidemia    Hypertension    Ischemic cardiomyopathy    Peripheral artery disease ([facility]    Stroke due to embolism of left middle cerebral artery ([facility] 07/2019   Past Surgical History:  Procedure Laterality Date   CARDIAC CATHETERIZATION N/A 02/21/2015   Procedure: Left Heart Cath and Coronary Angiography;  Surgeon: JAdrian Prows MD;  [facility];  Service: Cardiovascular;  Laterality: N/A;   CORONARY ANGIOPLASTY     LIPOMA EXCISION Right    PERIPHERAL VASCULAR CATHETERIZATION Bilateral 04/25/2015   Procedure: Lower Extremity Angiography;  Surgeon: JAdrian Prows MD;  [facility];  Service: Cardiovascular;  Laterality:  Bilateral;   PERIPHERAL VASCULAR CATHETERIZATION Left 05/30/2015   Procedure: Peripheral Vascular Intervention;  Surgeon: JAdrian Prows MD;  [facility];  Service: Cardiovascular;  Laterality: Left;  POPLITEAL   PERIPHERAL VASCULAR CATHETERIZATION Left 05/30/2015   Procedure: Peripheral Vascular Intervention;  Surgeon: JAdrian Prows MD;  [facility];  Service: Cardiovascular;  Laterality: Left;  POPLITEAL   Family History  Problem Relation Age of Onset   Hypertension Mother    Hypertension Father     Social History   Tobacco Use   Smoking status: Some Days    Packs/day: 0.50    Years: 64.00    Total pack years: 32.00    Types: Cigarettes   Smokeless tobacco: Never  Substance Use Topics   Alcohol use: Yes    Comment: occasionally   Marital Status: Widowed  ROS  Review of Systems  Cardiovascular:  Negative for chest pain, claudication, dyspnea on exertion and leg swelling.   Objective  Blood pressure 104/63, pulse (!) 59, temperature (!) 97.1 F (36.2 C), temperature source Temporal, resp. rate 16, height _0  (1.778 m), weight 156 lb (70.8 kg), SpO2 95 %. Body mass index is 22.38 kg/m.     03/04/2022    8:50 AM 08/27/2021    9:08 AM 06/25/2021    8:21 AM  Vitals with BMI  Height _1  _2  _3   Weight 156 lbs 160 lbs 165 lbs  BMI 22.38 [phone] Systolic [phone] Diastolic 63 66 66  Pulse 59 55 58    Physical Exam Neck:     Vascular: No carotid bruit or JVD.  Cardiovascular:     Rate and Rhythm: Normal rate and regular rhythm.     Pulses:          Femoral pulses are 2+ on the right side and 2+ on the left side.      Popliteal pulses are 2+ on the right side and 0 on the left side.       Dorsalis pedis pulses are 2+ on the right side and 0 on the left side.       Posterior tibial pulses are 1+ on the right side and 0 on the left side.     Heart sounds: Normal heart sounds. No murmur heard.    No gallop.  Pulmonary:      Effort: Pulmonary effort is normal.     Breath sounds: Normal breath sounds.  Abdominal:     General: Bowel sounds are normal.     Palpations: Abdomen is soft.  Musculoskeletal:        General: No swelling.     Right lower leg: No edema.     Left lower leg: No edema.      Laboratory examination:   Lab Results  Component Value Date   NA 139 08/14/2021   K 4.9 08/14/2021   CO2 23 08/14/2021   GLUCOSE 109 (H) 08/14/2021   BUN 15 08/14/2021   CREATININE 1.32 (H) 08/14/2021   CALCIUM 9.5 08/14/2021   EGFR 55 (L) 08/14/2021   GFRNONAA 55 (L) 12/13/2019    CrCl cannot be calculated (Patient's most recent lab result is older than the maximum 21 days allowed.).     Latest Ref Rng & Units 08/14/2021    8:55 AM 03/27/2021   10:34 AM 02/15/2021    8:26 AM  CMP  Glucose 70 - 99 mg/dL 109  102  101   BUN 8 - 27 mg/dL _0 Creatinine 0.76 - 1.27 mg/dL 1.32  1.38  1.15   Sodium 134 - 144 mmol/L 139  141  143   Potassium 3.5 - 5.2 mmol/L 4.9  5.4  4.7   Chloride 96 - 106 mmol/L 104  106  108   CO2 20 - 29 mmol/L _1 Calcium 8.6 - 10.2 mg/dL 9.5  9.8  9.1       Latest Ref Rng & Units 07/30/2019    5:51 AM 07/29/2019   12:06 PM 07/29/2019   11:58 AM  CBC  WBC 4.0 - 10.5 K/uL 4.6   4.6   Hemoglobin 13.0 - 17.0 g/dL 13.9  14.3  14.6   Hematocrit 39.0 - 52.0 % 42.9  42.0  44.5   Platelets 150 - 400 K/uL 212   233     Lipid Panel Lab Results  Component Value Date   CHOL 113 08/14/2021   HDL 48 08/14/2021   LDLCALC 51 08/14/2021   LDLDIRECT 49 08/14/2021   TRIG 66 08/14/2021   CHOLHDL 2.8 07/30/2019    External labs:   Labs 08/24/2020:  Total cholesterol 149, triglycerides 97, HDL 52, LDL 69.  Non-HDL cholesterol 97. Medications and allergies   Allergies  Allergen Reactions   Lisinopril Other (See Comments)    Hyper activity   Other     Halothane///Anesthesia - test was done to show allergy - Son had very bad experience- life threatening  Medication  list after today's encounter   Current Outpatient Medications:    amLODipine (NORVASC) 10 MG tablet, Take 1 tablet (10 mg total) by mouth daily., Disp: 90 tablet, Rfl: 3   carvedilol (COREG) 6.25 MG tablet, Take 1 tablet (6.25 mg total) by mouth 2 (two) times daily., Disp: 180 tablet, Rfl: 3   clopidogrel (PLAVIX) 75 MG tablet, Take 1 tablet by mouth once daily, Disp: 90 tablet, Rfl: 0   ENTRESTO 49-51 MG, Take 1 tablet by mouth twice daily, Disp: 180 tablet, Rfl: 0   ezetimibe (ZETIA) 10 MG tablet, Take 1 tablet by mouth once daily, Disp: 90 tablet, Rfl: 0   nitroGLYCERIN (NITROSTAT) 0.4 MG SL tablet, Place 0.4 mg under the tongue every 5 (five) minutes as needed for chest pain. , Disp: , Rfl:    rosuvastatin (CRESTOR) 40 MG tablet, TAKE 1 TABLET BY MOUTH AT BEDTIME, Disp: 90 tablet, Rfl: 0   spironolactone (ALDACTONE) 25 MG tablet, TAKE 1 TABLET BY MOUTH IN THE MORNING, Disp: 90 tablet, Rfl: 0   Radiology:    RADIOLOGY:  CTA head and neck 02/21/2021:  Right carotid system: Patent. Atherosclerotic wall thickening along the common carotid. Mixed plaque along the proximal internal carotid causing less than 50% stenosis. Possible mild fibromuscular dysplasia.   Left carotid system: Patent. Atherosclerotic wall thickening along the common carotid. Mixed plaque along the proximal internal carotid causing less than 50% stenosis. Possible mild fibromuscular dysplasia.  Left M2 MCA occlusion noted on prior MR angiogram 07/29/2019 with nonocclusive thrombus has resolved with mild remaining stenosis.   Otherwise stable appearance. Plaque without hemodynamically significant stenosis in the neck. Question of a 1 mm right paraclinoid ICA aneurysm.  Cardiac Studies:   CARDIAC DATABASE:   Heart Catheterization: Coronary Angiogram in [place] Acute anterior MI 03/12/2014: Stenting with 3.5 x 12 mm proximal and 3.5 x 32 mm promos Premier DES in the mid LAD. 50-60% mid circumflex stenosis,  nondominant right with a mid 85% stenosis.   Coronary Angiogram 02/21/2015 1. Widely patent mid LAD stent, 3.5 x 12 and 3.5 x 32 mm Promus placed on 03/12/2014 for acute MI, type III LAD which supplies essentially the entire anterolateral wall and inferior wall. 2. Moderate stenosis in the mid circumflex, 50%. Nondominant RCA. 3. Ischemic cardiomyopathy, LVEF 30-35% with mid to distal anterior anteroapical, apical, apical inferior, mid inferior  severe hypokinesis to akinesis. 4. Limited abdominal aortogram with bifemoral runoff reveals widely patent iliac vessels, profunda femoral vessel, moderately diseased bilateral internal iliac, severely diseased bilateral profunda femoral artery.  Peripheral arteriogram 06/27/2015:  Right SFA atherectomy with Turbo Hawk followed by 6x150 mm Lutonix (DCB) angioplasty. 90% to 0%.   Left leg angioplasty 05/30/15: 1.5 mm solid crown CSI atherectomy left SFA, popliteal artery lesions and anterior tibial vessel & drug coated balloon angioplasty to the mid left SFA 95% to 0%. Stenting of proximal and mid popliteal Artery 5.0 x 100 mm Supera self-expanding stent.  Lower Extremity Arterial Duplex 01/05/2021: No hemodynamically significant stenoses are identified in the right o rleft lower extremity arterial system.  This exam reveals mildly decreased perfusion of the right lower extremity, noted at the dorsalis pedis artery level (ABI 0.80) and moderately decreased perfusion of the left lower extremity, noted at the dorsalis pedis artery level (ABI 0.74).  Right PT is diffusely diseased. The left PT appears to be severely diseased or occluded with collateral flow. Study suggest diffuse small vessel disease below knee. Compared to the study done on  08/04/2015, no significant change.  Study also suggest patent bilateral angioplasty site at the SFA and left popliteal artery to be patent.  PCV ECHOCARDIOGRAM COMPLETE 03/27/2021  1. Left ventricle cavity is normal in size.  Mild concentric ypertrophy of the left ventricle. Moderate global hypokinesis. Mid to distal anteroseptal/anterior ypokinesis. Apical akinesis. LVEF 35-40%. No evidence of LV mural thrombus on this non-contrast study. Doppler evidence of grade I (impaired) diastolic dysfunction. 2. Mild tricuspid regurgitation. Estimated pulmonary artery systolic pressure 29 mmHg. 3. No significant change compared to previous study in 2020.    Carotid artery duplex 02/27/2022: Duplex suggests stenosis in the right internal carotid artery (50-69%). Duplex suggests stenosis in the left internal carotid artery (50-69%). Moderate heterogeneous plaque bilateral ICA.  Antegrade right vertebral artery flow. Antegrade left vertebral artery flow. No significant change compared to 08/14/2021. Follow up in six months is appropriate if clinically indicated.  EKG:   EKG 03/04/2022: Marked sinus bradycardia at rate of 44 bpm, normal axis, anteroseptal infarct old.  T wave abnormality, inferior ischemia and lateral ischemia.  Low-voltage complexes.  Compared to 08/27/2021, no significant change.  Assessment     ICD-10-CM   1. Atherosclerosis of native coronary artery of native heart without angina pectoris  I25.10 EKG 12-Lead    PCV ECHOCARDIOGRAM COMPLETE    PCV MYOCARDIAL PERFUSION WITH LEXISCAN    2. Chronic heart failure with reduced ejection fraction and diastolic dysfunction (HCC)  I50.42 PCV ECHOCARDIOGRAM COMPLETE    PCV MYOCARDIAL PERFUSION WITH LEXISCAN    3. Bilateral carotid artery stenosis  I65.23 PCV CAROTID DUPLEX (BILATERAL)    CANCELED: PCV CAROTID DUPLEX (BILATERAL)    4. Pure hypercholesterolemia  E78.00     5. Current tobacco use  Z72.0        There are no discontinued medications.   No orders of the defined types were placed in this encounter.   Orders Placed This Encounter  Procedures   PCV MYOCARDIAL PERFUSION WITH LEXISCAN    Standing Status:   Future    Standing Expiration Date:    05/04/2022   EKG 12-Lead   PCV ECHOCARDIOGRAM COMPLETE    Standing Status:   Future    Standing Expiration Date:   03/05/2023    Recommendations:   Shawn Schmidt is a 81 y.o. African-American male patient with STEMI 03/12/2014 in [place] with 2 overlapping proximal and mid LAD stents, ischemic cardiomyopathy with EF around 35-40%, hypertension, hyperlipidemia, PAD bilateral SFA angioplasty in 2017, stage IIIa chronic kidney disease, asymptomatic bilateral carotid artery stenosis, history of thrombotic stroke in June 2021, tobacco use disorder presents here for a 56-monthoffice visit.  1. Atherosclerosis of native coronary artery of native heart without angina pectoris Patient without angina pectoris, however it has been >5 years since last stress testing or ischemic evaluation.  Patient continues to smoke 1.5 packs of cigarettes every other day.  Extensive discussion regarding smoking again her today.  I will schedule him for repeat echocardiogram and also a Lexiscan nuclear stress test.  2. Chronic heart failure with reduced ejection fraction and diastolic dysfunction (HCC) Clearance heart failure, he will need repeat echocardiogram, presently on maximum dose of beta-blocker therapy along with Entresto, stage IIIa chronic kidney disease has also remained stable.  He is seeing his PCP in 2 weeks, will be obtaining again labs there as well.  3. Bilateral carotid artery stenosis Asymptomatic bilateral carotid artery stenosis, moderate disease.  Continue 6 monthly surveillance.  Patient is presently on Plavix  from cardiac standpoint, vascular standpoint and also carotid disease.  4. Pure hypercholesterolemia Lipids under excellent control, presently on high intensity, high-dose statin, Crestor 40 mg daily, continue the same.  5. Current tobacco use I again discussed extensively regarding smoking cessation, patient's daughter is present.  I also offered medications and also nicotine  patches.  Unless he sees significant abnormality in the echocardiogram or nuclear stress test, I will see him back in 6 months for follow-up.  He does have asymptomatic marked sinus bradycardia but this is also remained stable over the years and I have discussed regarding near-syncope/syncope episodes if he has any to call us immediately.   [name], MD, [facility] 03/04/2022, 9:28 AM Office: [phone]

## 2022-03-11 ENCOUNTER — Other Ambulatory Visit: Payer: Self-pay | Admitting: Cardiology

## 2022-03-11 DIAGNOSIS — I5042 Chronic combined systolic (congestive) and diastolic (congestive) heart failure: Secondary | ICD-10-CM

## 2022-03-11 DIAGNOSIS — E782 Mixed hyperlipidemia: Secondary | ICD-10-CM

## 2022-03-11 DIAGNOSIS — I6523 Occlusion and stenosis of bilateral carotid arteries: Secondary | ICD-10-CM

## 2022-04-01 ENCOUNTER — Ambulatory Visit: Payer: PPO

## 2022-04-01 DIAGNOSIS — I2510 Atherosclerotic heart disease of native coronary artery without angina pectoris: Secondary | ICD-10-CM

## 2022-04-01 DIAGNOSIS — I5042 Chronic combined systolic (congestive) and diastolic (congestive) heart failure: Secondary | ICD-10-CM

## 2022-04-05 NOTE — Progress Notes (Signed)
Patient aware.

## 2022-04-05 NOTE — Progress Notes (Signed)
Stable heart function, LVEF 35 to 40% which is unchanged from previous.  He is presently doing well clinically, continue present medications, I will see him back in 6 months as previously reported unless the stress test is abnormal.

## 2022-05-20 ENCOUNTER — Other Ambulatory Visit: Payer: Self-pay | Admitting: Cardiology

## 2022-05-20 DIAGNOSIS — I5042 Chronic combined systolic (congestive) and diastolic (congestive) heart failure: Secondary | ICD-10-CM

## 2022-05-20 DIAGNOSIS — E782 Mixed hyperlipidemia: Secondary | ICD-10-CM

## 2022-05-20 DIAGNOSIS — I6523 Occlusion and stenosis of bilateral carotid arteries: Secondary | ICD-10-CM

## 2022-05-30 ENCOUNTER — Other Ambulatory Visit: Payer: Self-pay | Admitting: Cardiology

## 2022-05-30 DIAGNOSIS — I5042 Chronic combined systolic (congestive) and diastolic (congestive) heart failure: Secondary | ICD-10-CM

## 2022-06-06 ENCOUNTER — Other Ambulatory Visit: Payer: Self-pay | Admitting: Cardiology

## 2022-06-06 DIAGNOSIS — I6523 Occlusion and stenosis of bilateral carotid arteries: Secondary | ICD-10-CM

## 2022-06-06 DIAGNOSIS — I10 Essential (primary) hypertension: Secondary | ICD-10-CM

## 2022-06-06 DIAGNOSIS — E782 Mixed hyperlipidemia: Secondary | ICD-10-CM

## 2022-08-08 ENCOUNTER — Other Ambulatory Visit: Payer: Self-pay | Admitting: Cardiology

## 2022-08-08 DIAGNOSIS — I5042 Chronic combined systolic (congestive) and diastolic (congestive) heart failure: Secondary | ICD-10-CM

## 2022-08-08 DIAGNOSIS — E782 Mixed hyperlipidemia: Secondary | ICD-10-CM

## 2022-08-08 DIAGNOSIS — I6523 Occlusion and stenosis of bilateral carotid arteries: Secondary | ICD-10-CM

## 2022-08-20 ENCOUNTER — Ambulatory Visit: Payer: PPO

## 2022-08-20 DIAGNOSIS — I6523 Occlusion and stenosis of bilateral carotid arteries: Secondary | ICD-10-CM

## 2022-08-23 NOTE — Progress Notes (Signed)
Carotid artery duplex 08/20/2022: Duplex suggests stenosis in the right internal carotid artery (50-69%). Duplex suggests stenosis in the left internal carotid artery (50-69%). Antegrade right vertebral artery flow. Antegrade left vertebral artery flow. Moderate heterogeneous plaque bilateral ICA.  No significant changes since 02/28/2022. Follow up in six months is appropriate if clinically indicated.  I will discuss on visit soon

## 2022-08-26 ENCOUNTER — Other Ambulatory Visit: Payer: Self-pay | Admitting: Cardiology

## 2022-08-26 ENCOUNTER — Ambulatory Visit: Payer: PPO | Admitting: Cardiology

## 2022-08-26 DIAGNOSIS — I5042 Chronic combined systolic (congestive) and diastolic (congestive) heart failure: Secondary | ICD-10-CM

## 2022-08-29 ENCOUNTER — Encounter: Payer: Self-pay | Admitting: Cardiology

## 2022-08-29 ENCOUNTER — Ambulatory Visit: Payer: PPO | Admitting: Cardiology

## 2022-08-29 VITALS — BP 115/67 | HR 56 | Ht 70.0 in | Wt 158.4 lb

## 2022-08-29 DIAGNOSIS — Z720 Tobacco use: Secondary | ICD-10-CM

## 2022-08-29 DIAGNOSIS — I739 Peripheral vascular disease, unspecified: Secondary | ICD-10-CM

## 2022-08-29 DIAGNOSIS — E7800 Pure hypercholesterolemia, unspecified: Secondary | ICD-10-CM

## 2022-08-29 DIAGNOSIS — I2510 Atherosclerotic heart disease of native coronary artery without angina pectoris: Secondary | ICD-10-CM

## 2022-08-29 DIAGNOSIS — I5042 Chronic combined systolic (congestive) and diastolic (congestive) heart failure: Secondary | ICD-10-CM

## 2022-08-29 DIAGNOSIS — I6523 Occlusion and stenosis of bilateral carotid arteries: Secondary | ICD-10-CM

## 2022-08-29 NOTE — Progress Notes (Signed)
Primary Physician/Referring:  Barbie Banner, MD  Patient ID: Shawn Schmidt, male    DOB: Jul 26, 1940, 82 y.o.   MRN: [id number]  Chief Complaint  Patient presents with   Coronary Artery Disease   PAD   Atherosclerosis of native coronary artery of native heart w   Hyperlipidemia   Follow-up    6 months   HPI:    Shawn Schmidt  is a 82 y.o. African-American male patient with STEMI 03/12/2014 in [place] with 2 overlapping proximal and mid LAD stents, ischemic cardiomyopathy with EF around 35-40%, hypertension, hyperlipidemia, PAD bilateral SFA angioplasty in 2017, stage IIIa chronic kidney disease, asymptomatic bilateral carotid artery stenosis, history of thrombotic stroke in June 2021, tobacco use disorder 80-month office visit office visit.   Tolerating medications well.  No complaints today. Denies any symptoms of claudication. He remains active with doing yard work. He does continue to smoke 1.0 pack every other day.  Past Medical History:  Diagnosis Date   Carotid artery occlusion    CHF (congestive heart failure) (HCC)    Coronary artery disease    HTN (hypertension) 11/27/2018   Hyperlipemia 11/27/2018   Hyperlipidemia    Hypertension    Ischemic cardiomyopathy    Peripheral artery disease (HCC)    Stroke due to embolism of left middle cerebral artery (HCC) 07/2019   Past Surgical History:  Procedure Laterality Date   CARDIAC CATHETERIZATION N/A 02/21/2015   Procedure: Left Heart Cath and Coronary Angiography;  Surgeon: Yates Decamp, MD;  Location: [facility] INVASIVE CV LAB;  Service: Cardiovascular;  Laterality: N/A;   CORONARY ANGIOPLASTY     LIPOMA EXCISION Right    PERIPHERAL VASCULAR CATHETERIZATION Bilateral 04/25/2015   Procedure: Lower Extremity Angiography;  Surgeon: Yates Decamp, MD;  Location: [facility] INVASIVE CV LAB;  Service: Cardiovascular;  Laterality: Bilateral;   PERIPHERAL VASCULAR CATHETERIZATION Left 05/30/2015   Procedure: Peripheral Vascular  Intervention;  Surgeon: Yates Decamp, MD;  Location: [facility] INVASIVE CV LAB;  Service: Cardiovascular;  Laterality: Left;  POPLITEAL   PERIPHERAL VASCULAR CATHETERIZATION Left 05/30/2015   Procedure: Peripheral Vascular Intervention;  Surgeon: Yates Decamp, MD;  Location: [facility] INVASIVE CV LAB;  Service: Cardiovascular;  Laterality: Left;  POPLITEAL   Family History  Problem Relation Age of Onset   Hypertension Mother    Hypertension Father     Social History   Tobacco Use   Smoking status: Some Days    Packs/day: 0.50    Years: 64.00    Additional pack years: 0.00    Total pack years: 32.00    Types: Cigarettes   Smokeless tobacco: Never  Substance Use Topics   Alcohol use: Yes    Comment: occasionally   Marital Status: Widowed  ROS  Review of Systems  Cardiovascular:  Negative for chest pain, claudication, dyspnea on exertion and leg swelling.   Objective  Blood pressure 115/67, pulse (!) 56, height 5\' 10"  (1.778 m), weight 158 lb 6.4 oz (71.8 kg), SpO2 98 %. Body mass index is 22.73 kg/m.     08/29/2022    9:18 AM 03/04/2022    8:50 AM 08/27/2021    9:08 AM  Vitals with BMI  Height 5\' 10"  5\' 10"  5\' 10"   Weight 158 lbs 6 oz 156 lbs 160 lbs  BMI 22.73 22.38 22.96  Systolic 115 104 782  Diastolic 67 63 66  Pulse 56 59 55    Physical Exam Neck:     Vascular: No carotid bruit  or JVD.  Cardiovascular:     Rate and Rhythm: Normal rate and regular rhythm.     Pulses:          Femoral pulses are 2+ on the right side and 2+ on the left side.      Popliteal pulses are 2+ on the right side and 0 on the left side.       Dorsalis pedis pulses are 2+ on the right side and 2+ on the left side.       Posterior tibial pulses are 1+ on the right side and 0 on the left side.     Heart sounds: Normal heart sounds. No murmur heard.    No gallop.  Pulmonary:     Effort: Pulmonary effort is normal.     Breath sounds: Normal breath sounds.  Abdominal:     General: Bowel sounds are normal.      Palpations: Abdomen is soft.  Musculoskeletal:        General: No swelling.     Right lower leg: No edema.     Left lower leg: No edema.     Laboratory examination:   Lab Results  Component Value Date   NA 139 08/14/2021   K 4.9 08/14/2021   CO2 23 08/14/2021   GLUCOSE 109 (H) 08/14/2021   BUN 15 08/14/2021   CREATININE 1.32 (H) 08/14/2021   CALCIUM 9.5 08/14/2021   EGFR 55 (L) 08/14/2021   GFRNONAA 55 (L) 12/13/2019    CrCl cannot be calculated (Patient's most recent lab result is older than the maximum 21 days allowed.).     Latest Ref Rng & Units 08/14/2021    8:55 AM 03/27/2021   10:34 AM 02/15/2021    8:26 AM  CMP  Glucose 70 - 99 mg/dL 161  096  045   BUN 8 - 27 mg/dL 15  15  15    Creatinine 0.76 - 1.27 mg/dL 4.09  8.11  9.14   Sodium 134 - 144 mmol/L 139  141  143   Potassium 3.5 - 5.2 mmol/L 4.9  5.4  4.7   Chloride 96 - 106 mmol/L 104  106  108   CO2 20 - 29 mmol/L 23  22  22    Calcium 8.6 - 10.2 mg/dL 9.5  9.8  9.1       Latest Ref Rng & Units 07/30/2019    5:51 AM 07/29/2019   12:06 PM 07/29/2019   11:58 AM  CBC  WBC 4.0 - 10.5 K/uL 4.6   4.6   Hemoglobin 13.0 - 17.0 g/dL 78.2  95.6  21.3   Hematocrit 39.0 - 52.0 % 42.9  42.0  44.5   Platelets 150 - 400 K/uL 212   233     Lipid Panel Lab Results  Component Value Date   CHOL 113 08/14/2021   HDL 48 08/14/2021   LDLCALC 51 08/14/2021   LDLDIRECT 49 08/14/2021   TRIG 66 08/14/2021   CHOLHDL 2.8 07/30/2019    External labs:   Labs 08/24/2020:  Total cholesterol 149, triglycerides 97, HDL 52, LDL 69.  Non-HDL cholesterol 97.  Radiology:   RADIOLOGY:  CTA head and neck 02/21/2021:  Right carotid system: Patent. Atherosclerotic wall thickening along the common carotid. Mixed plaque along the proximal internal carotid causing less than 50% stenosis. Possible mild fibromuscular dysplasia.   Left carotid system: Patent. Atherosclerotic wall thickening along the common carotid. Mixed plaque along the  proximal internal carotid causing less than  50% stenosis. Possible mild fibromuscular dysplasia.  Left M2 MCA occlusion noted on prior MR angiogram 07/29/2019 with nonocclusive thrombus has resolved with mild remaining stenosis.   Otherwise stable appearance. Plaque without hemodynamically significant stenosis in the neck. Question of a 1 mm right paraclinoid ICA aneurysm.  Cardiac Studies:   CARDIAC DATABASE:   Heart Catheterization: Coronary Angiogram in [place] Acute anterior MI 03/12/2014: Stenting with 3.5 x 12 mm proximal and 3.5 x 32 mm promos Premier DES in the mid LAD. 50-60% mid circumflex stenosis, nondominant right with a mid 85% stenosis.   Coronary Angiogram 02/21/2015 1. Widely patent mid LAD stent, 3.5 x 12 and 3.5 x 32 mm Promus placed on 03/12/2014 for acute MI, type III LAD which supplies essentially the entire anterolateral wall and inferior wall. 2. Moderate stenosis in the mid circumflex, 50%. Nondominant RCA. 3. Ischemic cardiomyopathy, LVEF 30-35% with mid to distal anterior anteroapical, apical, apical inferior, mid inferior  severe hypokinesis to akinesis. 4. Limited abdominal aortogram with bifemoral runoff reveals widely patent iliac vessels, profunda femoral vessel, moderately diseased bilateral internal iliac, severely diseased bilateral profunda femoral artery.  Peripheral arteriogram 06/27/2015:  Right SFA atherectomy with Turbo Hawk followed by 6x150 mm Lutonix (DCB) angioplasty. 90% to 0%.   Left leg angioplasty 05/30/15: 1.5 mm solid crown CSI atherectomy left SFA, popliteal artery lesions and anterior tibial vessel & drug coated balloon angioplasty to the mid left SFA 95% to 0%. Stenting of proximal and mid popliteal Artery 5.0 x 100 mm Supera self-expanding stent.  Lower Extremity Arterial Duplex 01/05/2021: No hemodynamically significant stenoses are identified in the right o rleft lower extremity arterial system.  This exam reveals mildly  decreased perfusion of the right lower extremity, noted at the dorsalis pedis artery level (ABI 0.80) and moderately decreased perfusion of the left lower extremity, noted at the dorsalis pedis artery level (ABI 0.74).  Right PT is diffusely diseased. The left PT appears to be severely diseased or occluded with collateral flow. Study suggest diffuse small vessel disease below knee. Compared to the study done on 08/04/2015, no significant change.  Study also suggest patent bilateral angioplasty site at the SFA and left popliteal artery to be patent.  PCV ECHOCARDIOGRAM COMPLETE 04/01/2022  Narrative Echocardiogram 04/03/2022: Left ventricle cavity is normal in size and wall thickness. Mid to apical anteroseptal/anterior hypokinesis, apical akinesis. LVEF 35-40%. No apical thrombus seen on this non-contrast study. Doppler evidence of grade I (impaired) diastolic dysfunction, normal LAP. Mild (Grade I) mitral regurgitation. Mild tricuspid regurgitation. Estimated pulmonary artery systolic pressure 30 mmHg. Trace pericardial effusion, new since previous study on 03/27/2021 and 2020. No other significant change noted.    Carotid artery duplex 08/20/2022: Duplex suggests stenosis in the right internal carotid artery (50-69%). Duplex suggests stenosis in the left internal carotid artery (50-69%). Antegrade right vertebral artery flow. Antegrade left vertebral artery flow. Moderate heterogeneous plaque bilateral ICA.  No significant changes since 02/28/2022. Follow up in six months is appropriate if clinically indicated.  EKG:   EKG 08/29/2022: Normal sinus rhythm/marked sinus bradycardia at rate of 48 bpm, marked AV abnormality, inferior and anterolateral ischemia.  Compared to 03/04/2022, no significant change.    Medications and allergies   Allergies  Allergen Reactions   Lisinopril Other (See Comments)    Hyper activity   Other     Halothane///Anesthesia - test was done to show allergy -  Son had very bad experience- life threatening     Current Outpatient Medications:  amLODipine (NORVASC) 10 MG tablet, Take 1 tablet by mouth once daily, Disp: 90 tablet, Rfl: 0   carvedilol (COREG) 6.25 MG tablet, Take 1/2 (one-half) tablet by mouth twice daily, Disp: 180 tablet, Rfl: 0   clopidogrel (PLAVIX) 75 MG tablet, Take 1 tablet by mouth once daily, Disp: 90 tablet, Rfl: 0   ENTRESTO 49-51 MG, Take 1 tablet by mouth twice daily, Disp: 180 tablet, Rfl: 0   ezetimibe (ZETIA) 10 MG tablet, Take 1 tablet by mouth once daily, Disp: 90 tablet, Rfl: 0   nitroGLYCERIN (NITROSTAT) 0.4 MG SL tablet, Place 0.4 mg under the tongue every 5 (five) minutes as needed for chest pain. , Disp: , Rfl:    rosuvastatin (CRESTOR) 40 MG tablet, TAKE 1 TABLET BY MOUTH AT BEDTIME, Disp: 90 tablet, Rfl: 0   spironolactone (ALDACTONE) 25 MG tablet, TAKE 1 TABLET BY MOUTH IN THE MORNING, Disp: 90 tablet, Rfl: 0   Assessment     ICD-10-CM   1. Atherosclerosis of native coronary artery of native heart without angina pectoris  I25.10 EKG 12-Lead    COMPLETE METABOLIC PANEL WITH GFR    CBC    TSH    High sensitivity CRP    2. Chronic heart failure with reduced ejection fraction and diastolic dysfunction (HCC)  I50.42 Pro b natriuretic peptide (BNP)    3. PAD (peripheral artery disease) (HCC)  I73.9     4. Pure hypercholesterolemia  E78.00 Lipid Panel With LDL/HDL Ratio    Lipoprotein A (LPA)    5. Current tobacco use  Z72.0 CT CHEST LUNG CA SCREEN LOW DOSE W/O CM    6. Bilateral carotid artery stenosis  I65.23 PCV CAROTID DUPLEX (BILATERAL)       There are no discontinued medications.   No orders of the defined types were placed in this encounter.   Orders Placed This Encounter  Procedures   CT CHEST LUNG CA SCREEN LOW DOSE W/O CM    [employer] PPO Wt 158/No Needs /No spinal cord/No body injector/no glucose mon/no heart monitor//ab w/[place]@[facility]  Please remember if you need to cancel  your appt, please do so 24 hours prior to your appointment to avoid getting charged a no-show fee of $75.00 pt is aware/pt verbal understood instructions given Current smoker 30 yr smoke hx Pack a day    Standing Status:   Future    Standing Expiration Date:   08/29/2023    Order Specific Question:   Preferred Imaging Location?    Answer:   [id number]   Lipid Panel With LDL/HDL Ratio   Lipoprotein A (LPA)   COMPLETE METABOLIC PANEL WITH GFR   CBC   TSH   High sensitivity CRP   Pro b natriuretic peptide (BNP)   EKG 12-Lead    Recommendations:   Shawn Schmidt is a 82 y.o. African-American male patient with STEMI 03/12/2014 in [place] with 2 overlapping proximal and mid LAD stents, ischemic cardiomyopathy with EF around 35-40%, hypertension, hyperlipidemia, PAD bilateral SFA angioplasty in 2017, stage IIIa chronic kidney disease, asymptomatic bilateral carotid artery stenosis, history of thrombotic stroke in June 2021, tobacco use disorder presents here for a 67-month office visit.  1. Atherosclerosis of native coronary artery of native heart without angina pectoris Patient remains asymptomatic without chest pain, presently on amlodipine, carvedilol and Plavix.  Continue the same.  He has got marked sinus bradycardia and markedly abnormal EKG but remains unchanged for the past 1 year or greater.  He needs  routine labs.  - EKG 12-Lead - COMPLETE METABOLIC PANEL WITH GFR - CBC - TSH - High sensitivity CRP  2. Chronic heart failure with reduced ejection fraction and diastolic dysfunction (HCC) Patient remains asymptomatic, however would like to establish a baseline NT proBNP in view of chronic systolic heart failure and diastolic heart failure. - Pro b natriuretic peptide (BNP)  3. PAD (peripheral artery disease) (HCC) Peripheral arterial disease remained stable without recurrence of claudication, he has got excellent pedal pulses as well.  4. Pure  hypercholesterolemia He needs lipid profile testing, a year ago his lipids were normal.  Will also obtain Lp(a). - Lipid Panel With LDL/HDL Ratio - Lipoprotein A (LPA)  5. Current tobacco use Continues to smoke 1 pack of cigarettes a day, patient has greater than 50-60-pack-year history of smoking, will order right low-dose CT scan of the chest. - CT CHEST LUNG CA SCREEN LOW DOSE W/O CM; Future  6. Bilateral carotid artery stenosis Moderate bilateral asymptomatic carotid stenosis, continue surveillance for now.  Presently on Plavix and statins.  Office visit in 6 months.  Smoking cessation extensively discussed with the patient and his wife present at the bedside.  Offered help with medications. - PCV CAROTID DUPLEX (BILATERAL); Future    Yates Decamp, MD, [facility] 08/29/2022, 9:50 AM Office: [phone]

## 2022-08-30 LAB — CMP14+EGFR
ALT: 35 IU/L (ref 0–44)
AST: 28 IU/L (ref 0–40)
Albumin/Globulin Ratio: 1.5 (ref 1.2–2.2)
Albumin: 4.8 g/dL (ref 3.8–4.8)
Alkaline Phosphatase: 75 IU/L (ref 44–121)
BUN/Creatinine Ratio: 13 (ref 10–24)
BUN: 17 mg/dL (ref 8–27)
Bilirubin Total: 0.4 mg/dL (ref 0.0–1.2)
CO2: 20 mmol/L (ref 20–29)
Calcium: 9.9 mg/dL (ref 8.6–10.2)
Chloride: 104 mmol/L (ref 96–106)
Creatinine, Ser: 1.34 mg/dL — ABNORMAL HIGH (ref 0.76–1.27)
Globulin, Total: 3.2 g/dL (ref 1.5–4.5)
Glucose: 102 mg/dL — ABNORMAL HIGH (ref 70–99)
Potassium: 4.5 mmol/L (ref 3.5–5.2)
Sodium: 141 mmol/L (ref 134–144)
Total Protein: 8 g/dL (ref 6.0–8.5)
eGFR: 54 mL/min/{1.73_m2} — ABNORMAL LOW (ref >59)

## 2022-08-30 LAB — CBC
Hematocrit: 45.9 % (ref 37.5–51.0)
Hemoglobin: 15.4 g/dL (ref 13.0–17.7)
MCH: 33.3 pg — ABNORMAL HIGH (ref 26.6–33.0)
MCHC: 33.6 g/dL (ref 31.5–35.7)
MCV: 99 fL — ABNORMAL HIGH (ref 79–97)
Platelets: 250 10*3/uL (ref 150–450)
RBC: 4.63 x10E6/uL (ref 4.14–5.80)
RDW: 12.9 % (ref 11.6–15.4)
WBC: 5.4 10*3/uL (ref 3.4–10.8)

## 2022-08-30 LAB — LIPID PANEL WITH LDL/HDL RATIO
Cholesterol, Total: 125 mg/dL (ref 100–199)
HDL: 50 mg/dL (ref >39)
LDL Chol Calc (NIH): 57 mg/dL (ref 0–99)
LDL/HDL Ratio: 1.1 ratio (ref 0.0–3.6)
Triglycerides: 98 mg/dL (ref 0–149)
VLDL Cholesterol Cal: 18 mg/dL (ref 5–40)

## 2022-08-30 LAB — LIPOPROTEIN A (LPA): Lipoprotein (a): 370.8 nmol/L — ABNORMAL HIGH (ref <75.0)

## 2022-08-30 LAB — TSH: TSH: 1.52 u[IU]/mL (ref 0.450–4.500)

## 2022-08-30 LAB — HIGH SENSITIVITY CRP: CRP, High Sensitivity: 0.37 mg/L (ref 0.00–3.00)

## 2022-08-30 LAB — PRO B NATRIURETIC PEPTIDE: NT-Pro BNP: 196 pg/mL (ref 0–486)

## 2022-08-31 NOTE — Progress Notes (Signed)
Please check and if he does qualify, please call and let him know the other labs are normal. Otherwise let me know so I can call

## 2022-09-04 NOTE — Progress Notes (Signed)
I have left message for the patient.

## 2022-09-23 ENCOUNTER — Other Ambulatory Visit: Payer: Self-pay | Admitting: Cardiology

## 2022-09-23 DIAGNOSIS — E782 Mixed hyperlipidemia: Secondary | ICD-10-CM

## 2022-09-23 DIAGNOSIS — I6523 Occlusion and stenosis of bilateral carotid arteries: Secondary | ICD-10-CM

## 2022-09-23 DIAGNOSIS — I10 Essential (primary) hypertension: Secondary | ICD-10-CM

## 2022-09-23 DIAGNOSIS — I5042 Chronic combined systolic (congestive) and diastolic (congestive) heart failure: Secondary | ICD-10-CM

## 2022-09-27 ENCOUNTER — Ambulatory Visit
Admission: RE | Admit: 2022-09-27 | Discharge: 2022-09-27 | Disposition: A | Discharge status: Home / Self Care | Payer: PPO | Source: Ambulatory Visit | Attending: Cardiology | Admitting: Cardiology

## 2022-09-27 DIAGNOSIS — Z720 Tobacco use: Secondary | ICD-10-CM

## 2022-09-30 NOTE — Progress Notes (Signed)
Lvm for pt to call back. 

## 2022-09-30 NOTE — Progress Notes (Signed)
Let him know that the lung cancer screening does not show any significant abnormality.  Calcification of the coronary arteries is old and known as he has had CAD

## 2022-09-30 NOTE — Progress Notes (Signed)
Low-dose CT scan of the chest for lung cancer screening 09/30/2022: 1. Cardiovascular: Heart size is normal. There is no significant pericardial fluid, thickening or pericardial calcification. There is aortic atherosclerosis, as well as atherosclerosis of the great vessels of the mediastinum and the coronary arteries, including calcified atherosclerotic plaque in the left main, left anterior descending, left circumflex and right coronary arteries. 2. Lungs/Pleura: Tiny right upper lobe pulmonary nodule (axial image 99), with a volume derived mean diameter of only 2.7 mm. No other larger more suspicious appearing pulmonary nodules Mild diffuse bronchial wall thickening with mild to moderate centrilobular and paraseptal emphysema; imaging findings suggestive of underlying COPD. Continue annual screening with low-dose chest CT without contrast in 12 months.

## 2022-11-05 NOTE — Progress Notes (Signed)
Called patient no answer left a vm

## 2022-11-11 NOTE — Progress Notes (Signed)
Called patient no answer left a vm

## 2022-11-19 ENCOUNTER — Other Ambulatory Visit: Payer: Self-pay | Admitting: Cardiology

## 2022-11-19 DIAGNOSIS — I5042 Chronic combined systolic (congestive) and diastolic (congestive) heart failure: Secondary | ICD-10-CM

## 2022-11-19 DIAGNOSIS — E782 Mixed hyperlipidemia: Secondary | ICD-10-CM

## 2022-11-19 DIAGNOSIS — I6523 Occlusion and stenosis of bilateral carotid arteries: Secondary | ICD-10-CM

## 2022-11-25 ENCOUNTER — Other Ambulatory Visit: Payer: Self-pay | Admitting: Cardiology

## 2022-11-25 DIAGNOSIS — I5042 Chronic combined systolic (congestive) and diastolic (congestive) heart failure: Secondary | ICD-10-CM

## 2022-12-17 ENCOUNTER — Other Ambulatory Visit: Payer: Self-pay | Admitting: Cardiology

## 2022-12-17 DIAGNOSIS — E782 Mixed hyperlipidemia: Secondary | ICD-10-CM

## 2022-12-17 DIAGNOSIS — I10 Essential (primary) hypertension: Secondary | ICD-10-CM

## 2022-12-17 DIAGNOSIS — I6523 Occlusion and stenosis of bilateral carotid arteries: Secondary | ICD-10-CM

## 2023-02-24 ENCOUNTER — Other Ambulatory Visit: Payer: Self-pay | Admitting: Cardiology

## 2023-02-24 DIAGNOSIS — E782 Mixed hyperlipidemia: Secondary | ICD-10-CM

## 2023-02-24 DIAGNOSIS — I5042 Chronic combined systolic (congestive) and diastolic (congestive) heart failure: Secondary | ICD-10-CM

## 2023-02-24 DIAGNOSIS — I6523 Occlusion and stenosis of bilateral carotid arteries: Secondary | ICD-10-CM

## 2023-02-25 ENCOUNTER — Ambulatory Visit (HOSPITAL_COMMUNITY)
Admission: RE | Admit: 2023-02-25 | Discharge: 2023-02-25 | Disposition: A | Discharge status: Home / Self Care | Payer: PPO | Source: Ambulatory Visit | Attending: Cardiology | Admitting: Cardiology

## 2023-02-25 ENCOUNTER — Other Ambulatory Visit: Payer: PPO

## 2023-02-25 DIAGNOSIS — I6523 Occlusion and stenosis of bilateral carotid arteries: Secondary | ICD-10-CM | POA: Diagnosis present

## 2023-03-03 ENCOUNTER — Ambulatory Visit: Payer: PPO | Attending: Cardiology | Admitting: Cardiology

## 2023-03-03 ENCOUNTER — Encounter: Payer: Self-pay | Admitting: Cardiology

## 2023-03-03 VITALS — BP 98/58 | HR 58 | Resp 16 | Ht 70.0 in | Wt 155.2 lb

## 2023-03-03 DIAGNOSIS — I5042 Chronic combined systolic (congestive) and diastolic (congestive) heart failure: Secondary | ICD-10-CM | POA: Diagnosis not present

## 2023-03-03 DIAGNOSIS — I739 Peripheral vascular disease, unspecified: Secondary | ICD-10-CM

## 2023-03-03 DIAGNOSIS — E7800 Pure hypercholesterolemia, unspecified: Secondary | ICD-10-CM | POA: Diagnosis not present

## 2023-03-03 DIAGNOSIS — I2510 Atherosclerotic heart disease of native coronary artery without angina pectoris: Secondary | ICD-10-CM | POA: Diagnosis not present

## 2023-03-03 DIAGNOSIS — Z720 Tobacco use: Secondary | ICD-10-CM

## 2023-03-03 MED ORDER — AMLODIPINE BESYLATE 5 MG PO TABS: 5.0000 mg | ORAL_TABLET | Freq: Every day | ORAL | 3 refills | Status: DC

## 2023-03-03 NOTE — Progress Notes (Signed)
Cardiology Office Note:  .   Date:  03/03/2023  ID:  Shawn Schmidt, DOB [birth year], MRN [id number] PCP: Barbie Banner, MD   HeartCare Providers Cardiologist:  Yates Decamp, MD    History of Present Illness: .   Shawn Schmidt is a 82 y.o. African-American male patient with STEMI 03/12/2014 in [place] with 2 overlapping proximal and mid LAD stents, ischemic cardiomyopathy with EF around 35-40%, hypertension, hyperlipidemia, PAD bilateral SFA angioplasty in 2017, stage IIIa chronic kidney disease, asymptomatic bilateral carotid artery stenosis, history of thrombotic stroke in June 2021, tobacco use disorder presents here for a 1-month office visit.   Discussed the use of AI scribe software for clinical note transcription with the patient, who gave verbal consent to proceed.  History of Present Illness   Shawn Schmidt, a patient with a history of heart disease, including a previous heart attack, and blockages in the legs, presents for a routine check-up. The patient reports feeling "fairly good" and denies any recent chest pain or shortness of breath. He also denies any chest tightness or heaviness when exerting himself, stating he usually doesn't push himself too hard. The patient has been active, doing yard work without experiencing any shortness of breath or leg cramping. He does report occasional cramping in his legs at night, primarily in the left calf, but states it's not severe. The patient is a smoker, consuming about a pack a day.      Review of Systems  Cardiovascular:  Positive for claudication and dyspnea on exertion. Negative for chest pain and leg swelling.    Labs    Lab Results  Component Value Date   CHOL 125 08/29/2022   HDL 50 08/29/2022   LDLCALC 57 08/29/2022   LDLDIRECT 49 08/14/2021   TRIG 98 08/29/2022   CHOLHDL 2.8 07/30/2019   Lab Results  Component Value Date   NA 141 08/29/2022   K 4.5 08/29/2022   CO2 20 08/29/2022   GLUCOSE 102 (H)  08/29/2022   BUN 17 08/29/2022   CREATININE 1.34 (H) 08/29/2022   CALCIUM 9.9 08/29/2022   EGFR 54 (L) 08/29/2022   GFRNONAA 55 (L) 12/13/2019      Latest Ref Rng & Units 08/29/2022   10:09 AM 08/14/2021    8:55 AM 03/27/2021   10:34 AM  BMP  Glucose 70 - 99 mg/dL 062  694  854   BUN 8 - 27 mg/dL 17  15  15    Creatinine 0.76 - 1.27 mg/dL 6.27  0.35  0.09   BUN/Creat Ratio 10 - 24 13  11  11    Sodium 134 - 144 mmol/L 141  139  141   Potassium 3.5 - 5.2 mmol/L 4.5  4.9  5.4   Chloride 96 - 106 mmol/L 104  104  106   CO2 20 - 29 mmol/L 20  23  22    Calcium 8.6 - 10.2 mg/dL 9.9  9.5  9.8        Latest Ref Rng & Units 08/29/2022   10:09 AM 07/30/2019    5:51 AM 07/29/2019   12:06 PM  CBC  WBC 3.4 - 10.8 x10E3/uL 5.4  4.6    Hemoglobin 13.0 - 17.7 g/dL 38.1  82.9  93.7   Hematocrit 37.5 - 51.0 % 45.9  42.9  42.0   Platelets 150 - 450 x10E3/uL 250  212      Physical Exam:   VS:  BP (!) 98/58 (BP Location: Right Arm,  Patient Position: Sitting, Cuff Size: Normal)   Pulse (!) 58   Resp 16   Ht 5\' 10"  (1.778 m)   Wt 155 lb 3.2 oz (70.4 kg)   SpO2 94%   BMI 22.27 kg/m    Wt Readings from Last 3 Encounters:  03/03/23 155 lb 3.2 oz (70.4 kg)  08/29/22 158 lb 6.4 oz (71.8 kg)  03/04/22 156 lb (70.8 kg)     Physical Exam Neck:     Vascular: No carotid bruit or JVD.  Cardiovascular:     Rate and Rhythm: Normal rate and regular rhythm.     Pulses:          Popliteal pulses are 0 on the right side and 2+ on the left side.       Dorsalis pedis pulses are 1+ on the right side and 2+ on the left side.       Posterior tibial pulses are 0 on the right side and 0 on the left side.     Heart sounds: Normal heart sounds. No murmur heard.    No gallop.  Pulmonary:     Effort: Pulmonary effort is normal.     Breath sounds: Normal breath sounds.  Abdominal:     General: Bowel sounds are normal.     Palpations: Abdomen is soft.  Musculoskeletal:     Right lower leg: No edema.      Left lower leg: No edema.     Studies Reviewed: Marland Kitchen    Peripheral arteriogram 06/27/2015:  Right SFA atherectomy with Turbo Hawk followed by 6x150 mm Lutonix (DCB) angioplasty. 90% to 0%.   Left leg angioplasty 05/30/15: 1.5 mm solid crown CSI atherectomy left SFA, popliteal artery lesions and anterior tibial vessel & drug coated balloon angioplasty to the mid left SFA 95% to 0%. Stenting of proximal and mid popliteal Artery 5.0 x 100 mm Supera self-expanding stent.  Lower Extremity Arterial Duplex 01/05/2021: No hemodynamically significant stenoses are identified in the right o rleft lower extremity arterial system.  This exam reveals mildly decreased perfusion of the right lower extremity, noted at the dorsalis pedis artery level (ABI 0.80) and moderately decreased perfusion of the left lower extremity, noted at the dorsalis pedis artery level (ABI 0.74).  Right PT is diffusely diseased. The left PT appears to be severely diseased or occluded with collateral flow. Study suggest diffuse small vessel disease below knee. Compared to the study done on 08/04/2015, no significant change.  Study also suggest patent bilateral angioplasty site at the SFA and left popliteal artery to be patent.  Echocardiogram 04/03/2022: Left ventricle cavity is normal in size and wall thickness. Mid to apical anteroseptal/anterior hypokinesis, apical akinesis. LVEF 35-40%. No apical thrombus seen on this non-contrast study. Doppler evidence of grade I (impaired) diastolic dysfunction, normal LAP.  Mild (Grade I) mitral regurgitation. Mild tricuspid regurgitation. Estimated pulmonary artery systolic pressure 30 mmHg. Trace pericardial effusion, new since previous study on 03/27/2021. No other significant change noted.   Carotid artery duplex 02/25/2023: Right Carotid: The extracranial vessels were near-normal with only minimal  wall  thickening or plaque.  Left Carotid: The extracranial vessels were near-normal with  only minimal  wall thickening or plaque.  Vertebrals:  Bilateral vertebral arteries demonstrate antegrade flow.  Subclavians: Normal flow hemodynamics were seen in bilateral subclavian arteries.   EKG:    EKG 08/29/2022: Normal sinus rhythm/marked sinus bradycardia at rate of 48 bpm, marked T waveabnormality, inferior and anterolateral ischemia.  Compared to 03/04/2022,  no significant change.  Current Meds  Medication Sig   amLODipine (NORVASC) 5 MG tablet Take 1 tablet (5 mg total) by mouth daily.   carvedilol (COREG) 6.25 MG tablet Take 1/2 (one-half) tablet by mouth twice daily   clopidogrel (PLAVIX) 75 MG tablet Take 1 tablet by mouth once daily   ENTRESTO 49-51 MG Take 1 tablet by mouth twice daily   ezetimibe (ZETIA) 10 MG tablet Take 1 tablet by mouth once daily   nitroGLYCERIN (NITROSTAT) 0.4 MG SL tablet Place 0.4 mg under the tongue every 5 (five) minutes as needed for chest pain.    rosuvastatin (CRESTOR) 40 MG tablet TAKE 1 TABLET BY MOUTH AT BEDTIME   spironolactone (ALDACTONE) 25 MG tablet TAKE 1 TABLET BY MOUTH IN THE MORNING   [DISCONTINUED] amLODipine (NORVASC) 10 MG tablet Take 1 tablet by mouth once daily     ASSESSMENT AND PLAN: .      ICD-10-CM   1. Atherosclerosis of native coronary artery of native heart without angina pectoris  I25.10     2. Chronic heart failure with reduced ejection fraction and diastolic dysfunction (HCC)  I50.42 ECHOCARDIOGRAM COMPLETE    3. PAD (peripheral artery disease) (HCC)  I73.9     4. Pure hypercholesterolemia  E78.00     5. Current tobacco use  Z72.0       Assessment and Plan    Coronary Artery Disease with prior Myocardial Infarction Stable, no new chest pain or shortness of breath. Cholesterol well controlled on current medications. -Continue current medications. -Order echocardiogram to assess heart function (last done December 2023).  Heart Failure with Reduced Ejection Fraction Stable, no new symptoms of heart  failure such as leg swelling or worsening shortness of breath. -Continue current medications including Carvedilol, Entresto, and Spironolactone.  Peripheral Artery Disease No new symptoms of claudication or leg cramps during physical activity. -Continue current management.  Hypertension Blood pressure on the lower side. -Reduce Amlodipine dose from 10mg  to 5mg  daily.  Chronic Kidney Disease stage IIIa Stable kidney function.  Labs reviewed. -Continue current management.  Tobacco Use Patient continues to smoke approximately one pack per day. -Encourage smoking cessation.  Follow-up in 1 year.      Signed,  Yates Decamp, MD, [facility] 03/03/2023, 5:32 PM [facility] [address] [place], [place] [address] Phone: [phone]. Fax:  [phone]

## 2023-03-03 NOTE — Patient Instructions (Signed)
Medication Instructions:  Your physician recommends that you continue on your current medications as directed. Please refer to the Current Medication list given to you today. Continue amlodipine 5 mg by mouth daily  *If you need a refill on your cardiac medications before your next appointment, please call your pharmacy*   Lab Work: none If you have labs (blood work) drawn today and your tests are completely normal, you will receive your results only by: MyChart Message (if you have MyChart) OR A paper copy in the mail If you have any lab test that is abnormal or we need to change your treatment, we will call you to review the results.   Testing/Procedures: Your physician has requested that you have an echocardiogram. Echocardiography is a painless test that uses sound waves to create images of your heart. It provides your doctor with information about the size and shape of your heart and how well your heart's chambers and valves are working. This procedure takes approximately one hour. There are no restrictions for this procedure. Please do NOT wear cologne, perfume, aftershave, or lotions (deodorant is allowed). Please arrive 15 minutes prior to your appointment time.  Please note: We ask at that you not bring children with you during ultrasound (echo/ vascular) testing. Due to room size and safety concerns, children are not allowed in the ultrasound rooms during exams. Our front office staff cannot provide observation of children in our lobby area while testing is being conducted. An adult accompanying a patient to their appointment will only be allowed in the ultrasound room at the discretion of the ultrasound technician under special circumstances. We apologize for any inconvenience.    Follow-Up: At [facility], you and your health needs are our priority.  As part of our continuing mission to provide you with exceptional heart care, we have created designated Provider Care  Teams.  These Care Teams include your primary Cardiologist (physician) and Advanced Practice Providers (APPs -  Physician Assistants and Nurse Practitioners) who all work together to provide you with the care you need, when you need it.  We recommend signing up for the patient portal called "MyChart".  Sign up information is provided on this After Visit Summary.  MyChart is used to connect with patients for Virtual Visits (Telemedicine).  Patients are able to view lab/test results, encounter notes, upcoming appointments, etc.  Non-urgent messages can be sent to your provider as well.   To learn more about what you can do with MyChart, go to [email or web address].    Your next appointment:   12 month(s)  Provider:   Yates Decamp, MD     Other Instructions

## 2023-03-31 ENCOUNTER — Other Ambulatory Visit: Payer: Self-pay | Admitting: Cardiology

## 2023-03-31 DIAGNOSIS — I5042 Chronic combined systolic (congestive) and diastolic (congestive) heart failure: Secondary | ICD-10-CM

## 2023-04-01 ENCOUNTER — Other Ambulatory Visit: Payer: Self-pay | Admitting: Cardiology

## 2023-04-01 DIAGNOSIS — I6523 Occlusion and stenosis of bilateral carotid arteries: Secondary | ICD-10-CM

## 2023-04-01 DIAGNOSIS — E782 Mixed hyperlipidemia: Secondary | ICD-10-CM

## 2023-04-21 ENCOUNTER — Ambulatory Visit (HOSPITAL_COMMUNITY): Payer: PPO

## 2023-05-14 ENCOUNTER — Ambulatory Visit (HOSPITAL_COMMUNITY): Payer: PPO | Attending: Internal Medicine

## 2023-05-14 DIAGNOSIS — I5042 Chronic combined systolic (congestive) and diastolic (congestive) heart failure: Secondary | ICD-10-CM | POA: Insufficient documentation

## 2023-05-14 LAB — ECHOCARDIOGRAM COMPLETE
Area-P 1/2: 3.56 cm2
Est EF: 45
S' Lateral: 3.8 cm

## 2023-05-14 MED ORDER — PERFLUTREN LIPID MICROSPHERE
1.0000 mL | INTRAVENOUS | Status: AC | PRN
  Administered 2023-05-14: 2 mL via INTRAVENOUS

## 2023-05-15 NOTE — Progress Notes (Signed)
Compared to 122/18/23, LVEF has improved from 35-40% to the present 45 (mild decrease in LV systolic function). All good.

## 2023-06-17 ENCOUNTER — Telehealth: Payer: Self-pay | Admitting: Cardiology

## 2023-06-17 NOTE — Telephone Encounter (Signed)
 Patient is requesting call back to discuss information needed to send to the [place] for appts and results. Please advise.

## 2023-06-18 NOTE — Telephone Encounter (Signed)
 Left message to call office

## 2023-06-19 NOTE — Telephone Encounter (Signed)
 Patient is returning phone call.

## 2023-06-23 NOTE — Telephone Encounter (Signed)
 I spoke with patient.  He reports he stopped by office on 3/7 and signed release of information for records to be sent to [facility].  I told him I would send message to medical records for follow up

## 2024-03-01 ENCOUNTER — Other Ambulatory Visit: Payer: Self-pay | Admitting: Cardiology

## 2024-03-01 DIAGNOSIS — I6523 Occlusion and stenosis of bilateral carotid arteries: Secondary | ICD-10-CM

## 2024-03-01 DIAGNOSIS — I5042 Chronic combined systolic (congestive) and diastolic (congestive) heart failure: Secondary | ICD-10-CM

## 2024-03-01 DIAGNOSIS — E782 Mixed hyperlipidemia: Secondary | ICD-10-CM
